# Patient Record
Sex: Female | Born: 1960 | Race: White | Hispanic: No | Marital: Married | Smoking: Never smoker
Health system: Southern US, Community
[De-identification: ages and names within clinical notes are randomized; demographics above are authoritative.]

## PROBLEM LIST (undated history)

## (undated) DIAGNOSIS — I219 Acute myocardial infarction, unspecified: Secondary | ICD-10-CM

## (undated) DIAGNOSIS — E039 Hypothyroidism, unspecified: Secondary | ICD-10-CM

## (undated) DIAGNOSIS — Z13220 Encounter for screening for lipoid disorders: Secondary | ICD-10-CM

## (undated) DIAGNOSIS — T7840XA Allergy, unspecified, initial encounter: Secondary | ICD-10-CM

## (undated) DIAGNOSIS — K219 Gastro-esophageal reflux disease without esophagitis: Secondary | ICD-10-CM

## (undated) DIAGNOSIS — J301 Allergic rhinitis due to pollen: Secondary | ICD-10-CM

## (undated) DIAGNOSIS — C4491 Basal cell carcinoma of skin, unspecified: Secondary | ICD-10-CM

## (undated) DIAGNOSIS — L719 Rosacea, unspecified: Secondary | ICD-10-CM

## (undated) DIAGNOSIS — I10 Essential (primary) hypertension: Secondary | ICD-10-CM

## (undated) DIAGNOSIS — F419 Anxiety disorder, unspecified: Secondary | ICD-10-CM

## (undated) DIAGNOSIS — E7800 Pure hypercholesterolemia, unspecified: Secondary | ICD-10-CM

## (undated) DIAGNOSIS — Z833 Family history of diabetes mellitus: Secondary | ICD-10-CM

## (undated) HISTORY — DX: Pure hypercholesterolemia, unspecified: E78.00

## (undated) HISTORY — DX: Family history of diabetes mellitus: Z83.3

## (undated) HISTORY — DX: Rosacea, unspecified: L71.9

## (undated) HISTORY — DX: Hypothyroidism, unspecified: E03.9

## (undated) HISTORY — DX: Allergic rhinitis due to pollen: J30.1

## (undated) HISTORY — DX: Acute myocardial infarction, unspecified: I21.9

## (undated) HISTORY — DX: Gastro-esophageal reflux disease without esophagitis: K21.9

## (undated) HISTORY — PX: AUGMENTATION MAMMAPLASTY: SUR837

## (undated) HISTORY — DX: Basal cell carcinoma of skin, unspecified: C44.91

## (undated) HISTORY — DX: Anxiety disorder, unspecified: F41.9

## (undated) HISTORY — DX: Essential (primary) hypertension: I10

## (undated) HISTORY — DX: Encounter for screening for lipoid disorders: Z13.220

## (undated) HISTORY — DX: Allergy, unspecified, initial encounter: T78.40XA

---

## 1995-12-15 HISTORY — PX: BREAST ENHANCEMENT SURGERY: SHX7

## 1999-12-11 ENCOUNTER — Encounter: Admission: RE | Admit: 1999-12-11 | Discharge: 2000-01-06 | Payer: Self-pay | Admitting: Family Medicine

## 2001-09-29 ENCOUNTER — Ambulatory Visit (HOSPITAL_BASED_OUTPATIENT_CLINIC_OR_DEPARTMENT_OTHER): Admission: RE | Admit: 2001-09-29 | Discharge: 2001-09-29 | Payer: Self-pay | Admitting: Plastic Surgery

## 2001-09-29 ENCOUNTER — Encounter (INDEPENDENT_AMBULATORY_CARE_PROVIDER_SITE_OTHER): Payer: Self-pay | Admitting: Specialist

## 2001-11-24 ENCOUNTER — Other Ambulatory Visit: Admission: RE | Admit: 2001-11-24 | Discharge: 2001-11-24 | Payer: Self-pay | Admitting: Obstetrics and Gynecology

## 2002-11-28 ENCOUNTER — Other Ambulatory Visit: Admission: RE | Admit: 2002-11-28 | Discharge: 2002-11-28 | Payer: Self-pay | Admitting: Obstetrics and Gynecology

## 2004-01-24 ENCOUNTER — Other Ambulatory Visit: Admission: RE | Admit: 2004-01-24 | Discharge: 2004-01-24 | Payer: Self-pay | Admitting: Obstetrics and Gynecology

## 2005-04-20 ENCOUNTER — Other Ambulatory Visit: Admission: RE | Admit: 2005-04-20 | Discharge: 2005-04-20 | Payer: Self-pay | Admitting: Obstetrics and Gynecology

## 2007-08-09 ENCOUNTER — Ambulatory Visit: Payer: Self-pay | Admitting: Internal Medicine

## 2007-08-09 LAB — CONVERTED CEMR LAB
BUN: 7 mg/dL
Basophils Absolute: 0 K/uL
Basophils Relative: 0.4 %
CO2: 30 meq/L
Calcium: 9.5 mg/dL
Chloride: 104 meq/L
Creatinine, Ser: 0.8 mg/dL
Eosinophils Absolute: 0.1 K/uL
Eosinophils Relative: 1.1 %
GFR calc Af Amer: 100 mL/min
GFR calc non Af Amer: 82 mL/min
Glucose, Bld: 119 mg/dL — ABNORMAL HIGH
HCT: 37.4 %
Hemoglobin: 12.9 g/dL
Lymphocytes Relative: 30.3 %
MCHC: 34.4 g/dL
MCV: 87.4 fL
Monocytes Absolute: 0.5 K/uL
Monocytes Relative: 8.7 %
Neutro Abs: 3.7 K/uL
Neutrophils Relative %: 59.5 %
Platelets: 283 K/uL
Potassium: 4.3 meq/L
Pro B Natriuretic peptide (BNP): 18 pg/mL
RBC: 4.28 M/uL
RDW: 12.9 %
Sed Rate: 13 mm/h
Sodium: 141 meq/L
TSH: 2.01 u[IU]/mL
WBC: 6.2 10*3/microliter

## 2007-08-11 ENCOUNTER — Ambulatory Visit: Payer: Self-pay | Admitting: Cardiology

## 2007-08-11 ENCOUNTER — Ambulatory Visit: Payer: Self-pay | Admitting: Internal Medicine

## 2007-08-17 ENCOUNTER — Ambulatory Visit: Payer: Self-pay

## 2007-08-17 ENCOUNTER — Encounter: Payer: Self-pay | Admitting: Internal Medicine

## 2007-09-14 ENCOUNTER — Ambulatory Visit: Payer: Self-pay | Admitting: Internal Medicine

## 2007-09-15 ENCOUNTER — Ambulatory Visit: Payer: Self-pay | Admitting: Internal Medicine

## 2007-09-29 ENCOUNTER — Ambulatory Visit: Payer: Self-pay | Admitting: Internal Medicine

## 2008-01-14 ENCOUNTER — Encounter: Payer: Self-pay | Admitting: Internal Medicine

## 2008-01-14 DIAGNOSIS — J301 Allergic rhinitis due to pollen: Secondary | ICD-10-CM | POA: Insufficient documentation

## 2008-01-14 DIAGNOSIS — E039 Hypothyroidism, unspecified: Secondary | ICD-10-CM | POA: Insufficient documentation

## 2008-11-22 ENCOUNTER — Ambulatory Visit: Payer: Self-pay | Admitting: Family Medicine

## 2008-11-22 DIAGNOSIS — Z85828 Personal history of other malignant neoplasm of skin: Secondary | ICD-10-CM | POA: Insufficient documentation

## 2008-11-22 DIAGNOSIS — K219 Gastro-esophageal reflux disease without esophagitis: Secondary | ICD-10-CM | POA: Insufficient documentation

## 2008-11-22 HISTORY — DX: Personal history of other malignant neoplasm of skin: Z85.828

## 2008-12-03 ENCOUNTER — Ambulatory Visit: Payer: Self-pay | Admitting: Family Medicine

## 2008-12-10 ENCOUNTER — Telehealth: Payer: Self-pay | Admitting: Family Medicine

## 2008-12-12 LAB — CONVERTED CEMR LAB
Alkaline Phosphatase: 54 units/L (ref 39–117)
Bilirubin, Direct: 0.1 mg/dL (ref 0.0–0.3)
Calcium: 8.9 mg/dL (ref 8.4–10.5)
GFR calc Af Amer: 99 mL/min
GFR calc non Af Amer: 82 mL/min
HDL: 64.9 mg/dL (ref >39.0)
Potassium: 4.4 meq/L (ref 3.5–5.1)
Sodium: 141 meq/L (ref 135–145)
TSH: 1.17 microintl units/mL (ref 0.35–5.50)
Total Bilirubin: 0.7 mg/dL (ref 0.3–1.2)
Total CHOL/HDL Ratio: 3.4
Total Protein: 6.9 g/dL (ref 6.0–8.3)
Triglycerides: 55 mg/dL (ref 0–149)
VLDL: 11 mg/dL (ref 0–40)

## 2009-01-15 ENCOUNTER — Ambulatory Visit: Payer: Self-pay | Admitting: Family Medicine

## 2009-03-13 ENCOUNTER — Ambulatory Visit: Payer: Self-pay | Admitting: Family Medicine

## 2009-03-13 DIAGNOSIS — E7800 Pure hypercholesterolemia, unspecified: Secondary | ICD-10-CM | POA: Insufficient documentation

## 2009-03-18 LAB — CONVERTED CEMR LAB
Cholesterol: 191 mg/dL (ref 0–200)
LDL Cholesterol: 128 mg/dL — ABNORMAL HIGH (ref 0–99)
Triglycerides: 53 mg/dL (ref 0.0–149.0)
VLDL: 10.6 mg/dL (ref 0.0–40.0)

## 2009-04-17 ENCOUNTER — Ambulatory Visit: Payer: Self-pay | Admitting: Family Medicine

## 2009-11-13 ENCOUNTER — Telehealth: Payer: Self-pay | Admitting: Family Medicine

## 2010-01-14 LAB — CONVERTED CEMR LAB: Pap Smear: NORMAL

## 2010-07-01 ENCOUNTER — Ambulatory Visit: Payer: Self-pay | Admitting: Family Medicine

## 2010-07-01 DIAGNOSIS — L719 Rosacea, unspecified: Secondary | ICD-10-CM | POA: Insufficient documentation

## 2010-07-07 LAB — CONVERTED CEMR LAB
Albumin: 4 g/dL (ref 3.5–5.2)
Bilirubin, Direct: 0.1 mg/dL (ref 0.0–0.3)
CO2: 29 meq/L (ref 19–32)
Calcium: 8.9 mg/dL (ref 8.4–10.5)
Cholesterol: 228 mg/dL — ABNORMAL HIGH (ref 0–200)
Creatinine, Ser: 0.7 mg/dL (ref 0.4–1.2)
GFR calc non Af Amer: 91.63 mL/min (ref >60)
Glucose, Bld: 87 mg/dL (ref 70–99)
HDL: 57.1 mg/dL (ref >39.00)
Sodium: 141 meq/L (ref 135–145)
Total CHOL/HDL Ratio: 4
Total Protein: 7 g/dL (ref 6.0–8.3)
Triglycerides: 73 mg/dL (ref 0.0–149.0)

## 2010-10-27 ENCOUNTER — Ambulatory Visit: Payer: Self-pay | Admitting: Family Medicine

## 2010-10-28 LAB — CONVERTED CEMR LAB
Cholesterol: 211 mg/dL — ABNORMAL HIGH (ref 0–200)
HDL: 60.2 mg/dL (ref >39.00)
Triglycerides: 66 mg/dL (ref 0.0–149.0)

## 2010-11-05 ENCOUNTER — Ambulatory Visit: Payer: Self-pay | Admitting: Family Medicine

## 2010-11-25 ENCOUNTER — Telehealth: Payer: Self-pay | Admitting: Family Medicine

## 2011-01-13 NOTE — Assessment & Plan Note (Signed)
Summary: CHECK THYROID/REFILL MEDICATION/CE   Vital Signs:  Patient profile:   50 year old female Weight:      123.50 pounds BMI:     21.28 Temp:     98.6 degrees F oral Pulse rate:   74 / minute Pulse rhythm:   regular BP sitting:   104 / 70  (left arm) Cuff size:   regular  Vitals Entered By: Janee Morn CMA (July 01, 2010 8:20 AM) CC: Recheck thyroid   History of Present Illness: Doing well overeall.  No specific complaints.   Right ankle injury...seeing Dr. Charlsie Merles.  Occured after fal 5-6 weks ago. X-ray neg on repeat. Wearing brace Was running, but unable to run currently.    Problems Prior to Update: 1)  Acne Rosacea  (ICD-695.3) 2)  Pure Hypercholesterolemia  (ICD-272.0) 3)  Screening For Lipoid Disorders  (ICD-V77.91) 4)  Basal Cell Carcinoma, Hx of  (ICD-V10.83) 5)  Gerd  (ICD-530.81) 6)  Hypothyroidism  (ICD-244.9) 7)  Allergic Rhinitis, Seasonal  (ICD-477.0)  Current Medications (verified): 1)  Synthroid 50 Mcg Tabs (Levothyroxine Sodium) .... Take 2  Tablets  By Mouth Once A Day On Monday Thru Friday and One Tablet Once Daily On Saturday and Sunday 2)  Alprazolam 0.25 Mg Tabs (Alprazolam) .Marland Kitchen.. 1-2 Tab By Mouth Daily As Needed Anxiety  Allergies: 1)  ! Sulfa 2)  ! Pcn 3)  ! Zithromax 4)  ! Macrodantin  Past History:  Past medical, surgical, family and social histories (including risk factors) reviewed, and no changes noted (except as noted below).  Past Medical History: Reviewed history from 11/22/2008 and no changes required. HYPOTHYROIDISM (ICD-244.9) ALLERGIC RHINITIS, SEASONAL (ICD-477.0) GERD  Past Surgical History: Reviewed history from 11/22/2008 and no changes required. breast implants 1997  Family History: Reviewed history from 11/22/2008 and no changes required. fahter: HTN mother: healthy brother: ? heart issue PGF: MI late age  Social History: Reviewed history from 11/22/2008 and no changes required. 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 CV:  Denies chest pain or discomfort. Resp:  Denies shortness of breath. GI:  Denies abdominal pain, bloody stools, constipation, and diarrhea. GU:  Denies dysuria.  Physical Exam  General:  Well-developed,well-nourished,in no acute distress; alert,appropriate and cooperative throughout examination Mouth:  Oral mucosa and oropharynx without lesions or exudates.  Teeth in good repair. Neck:  no carotid bruit or thyromegaly no cervical or supraclavicular lymphadenopathy  Lungs:  Normal respiratory effort, chest expands symmetrically. Lungs are clear to auscultation, no crackles or wheezes. Heart:  Normal rate and regular rhythm. S1 and S2 normal without gallop, murmur, click, rub or other extra sounds. Abdomen:  Bowel sounds positive,abdomen soft and non-tender without masses, organomegaly or hernias noted. Pulses:  R and L posterior tibial pulses are full and equal bilaterally  Extremities:  no edema   Impression & Recommendations:  Problem # 1:  PURE HYPERCHOLESTEROLEMIA (ICD-272.0) Due for reeval. Encouraged exercise, weight loss, healthy eating habits.  Orders: TLB-Lipid Panel (80061-LIPID) TLB-BMP (Basic Metabolic Panel-BMET) (80048-METABOL) TLB-Hepatic/Liver Function Pnl (80076-HEPATIC)  Problem # 2:  HYPOTHYROIDISM (ICD-244.9) Due for reeval.  Her updated medication list for this problem includes:    Synthroid 50 Mcg Tabs (Levothyroxine sodium) .Marland Kitchen... Take 2  tablets  by mouth once a day on monday thru friday and one tablet once daily  on saturday and sunday  Orders: TLB-TSH (Thyroid Stimulating Hormone) (84443-TSH)  Complete Medication List: 1)  Synthroid 50 Mcg Tabs (Levothyroxine sodium) .... Take 2  tablets  by mouth once a day on  monday thru friday and one tablet once daily on saturday and sunday 2)  Alprazolam 0.25 Mg Tabs (Alprazolam) .Marland Kitchen.. 1-2 tab by mouth daily as needed anxiety  Other Orders: Tdap => 23yrs IM [phone]) Admin 1st Vaccine (69629) Admin 1st Vaccine [facility]) [phone])  Current Allergies (reviewed today): ! SULFA ! PCN ! ZITHROMAX ! MACRODANTIN  Last PAP:  Normal (05/16/2008 2:31:28 PM) PAP Result Date:  01/14/2010 PAP Result:  normal PAP Next Due:  [facility] in next few months.      Tetanus/Td Vaccine    Vaccine Type: Tdap    Site: left deltoid    Mfr: GlaxoSmithKline    Dose: 0.5 ml    Route: IM    Given by: Benny Lennert CMA (AAMA)    Exp. Date: 03/07/2012    Lot #: [id number]    VIS given: 11/01/07 version given July 01, 2010.

## 2011-01-13 NOTE — Assessment & Plan Note (Signed)
Summary: sinus/dlo   Vital Signs:  Patient profile:   50 year old female Weight:      126.50 pounds Temp:     97.3 degrees F oral Pulse rate:   72 / minute Pulse rhythm:   regular BP sitting:   126 / 80  (left arm) Cuff size:   regular  Vitals Entered By: Sydell Axon LPN (November 05, 2010 2:17 PM) CC: Headache since Monday night, ? problem with sinuses   History of Present Illness: Pt here forn headache since Mon Nite, taking Aleve around the clkock...Marland Kitchenone every eight hrs. it has bothered her stomach. She has does nothing for congestion. She has no real problems with allergies lately.  She has not had fever or chills, headache in frontal area (yesterday much worse), no ear pain, bilat lat neck discomfort, no rhinitis, mild nasal congestion, no ST, no cough. She has taken 3 Aleve a day.   Problems Prior to Update: 1)  Acne Rosacea  (ICD-695.3) 2)  Pure Hypercholesterolemia  (ICD-272.0) 3)  Screening For Lipoid Disorders  (ICD-V77.91) 4)  Basal Cell Carcinoma, Hx of  (ICD-V10.83) 5)  Gerd  (ICD-530.81) 6)  Hypothyroidism  (ICD-244.9) 7)  Allergic Rhinitis, Seasonal  (ICD-477.0)  Medications Prior to Update: 1)  Synthroid 50 Mcg Tabs (Levothyroxine Sodium) .... Take 2  Tablets  By Mouth Once A Day On Monday Thru Friday and One Tablet Once Daily On Saturday and Sunday 2)  Alprazolam 0.25 Mg Tabs (Alprazolam) .Marland Kitchen.. 1-2 Tab By Mouth Daily As Needed Anxiety  Current Medications (verified): 1)  Synthroid 50 Mcg Tabs (Levothyroxine Sodium) .... Take 2  Tablets  By Mouth Once A Day On Monday Thru Friday and One Tablet Once Daily On Saturday and Sunday 2)  Alprazolam 0.25 Mg Tabs (Alprazolam) .Marland Kitchen.. 1-2 Tab By Mouth Daily As Needed Anxiety 3)  Aleve 220 Mg Tabs (Naproxen Sodium) .... As Needed 4)  Doxycycline Hyclate 100 Mg Caps (Doxycycline Hyclate) .... One Tab By Mouth Two Times A Day For 14 Days.  Allergies: 1)  ! Sulfa 2)  ! Pcn 3)  ! Zithromax 4)  ! Macrodantin  Physical  Exam  General:  Well-developed,well-nourished,in no acute distress; alert,appropriate and cooperative throughout examination Head:  Normocephalic and atraumatic without obvious abnormalities. No apparent alopecia or balding. Sinuses NT. Eyes:  Conjunctiva clear bilaterally.  Ears:  External ear exam shows no significant lesions or deformities.  Otoscopic examination reveals clear canals, tympanic membranes are intact bilaterally without bulging, retraction, inflammation or discharge. Hearing is grossly normal bilaterally. Nose:  External nasal examination shows no deformity or inflammation. Nasal mucosa are pink and moist without lesions or exudates. Mouth:  Oral mucosa and oropharynx without lesions or exudates.  Teeth in good repair. Neck:  No deformities, masses, or tenderness noted. Lungs:  Normal respiratory effort, chest expands symmetrically. Lungs are clear to auscultation, no crackles or wheezes. Heart:  Normal rate and regular rhythm. S1 and S2 normal without gallop, murmur, click, rub or other extra sounds.   Impression & Recommendations:  Problem # 1:  URI (ICD-465.9) Assessment New  With sinus congestion....see instructions. Her updated medication list for this problem includes:    Aleve 220 Mg Tabs (Naproxen sodium) .Marland Kitchen... As needed  Instructed on symptomatic treatment. Call if symptoms persist or worsen.   Complete Medication List: 1)  Synthroid 50 Mcg Tabs (Levothyroxine sodium) .... Take 2  tablets  by mouth once a day on monday thru friday and one tablet once daily on saturday  and sunday 2)  Alprazolam 0.25 Mg Tabs (Alprazolam) .Marland Kitchen.. 1-2 tab by mouth daily as needed anxiety 3)  Aleve 220 Mg Tabs (Naproxen sodium) .... As needed 4)  Doxycycline Hyclate 100 Mg Caps (Doxycycline hyclate) .... One tab by mouth two times a day for 14 days.  Patient Instructions: 1)  Take Guaifenesin by [facility] or [facility] and getting MUCOUS RELIEF EXPECTORANT  (400mg ), take 11/2 tabs by mouth AM and NOON. 2)  Drink lots of fluids anytime taking Guaifenesin.  3)  Take Aleve 2 tabs after brfst and supper 4)  If no impr in 3-4 days or fevers develop, use Doxy Prescriptions: DOXYCYCLINE HYCLATE 100 MG CAPS (DOXYCYCLINE HYCLATE) one tab by mouth two times a day for 14 days.  #28 x 0   Entered and Authorized by:   Shaune Leeks MD   Signed by:   Shaune Leeks MD on 11/05/2010   Method used:   Print then Give to Patient   RxID:   [id number]    Orders Added: 1)  Est. Patient Level III [56213]    Current Allergies (reviewed today): ! SULFA ! PCN ! ZITHROMAX ! MACRODANTIN

## 2011-01-15 NOTE — Progress Notes (Signed)
Summary: ALPRAZOLAM  Phone Note Refill Request Message from:  [employer]line on November 25, 2010 8:58 AM  Refills Requested: Medication #1:  ALPRAZOLAM 0.25 MG TABS 1-2 tab by mouth daily as needed anxiety   Supply Requested: 1 month [facility] [address]   Method Requested: Telephone to Pharmacy Initial call taken by: Benny Lennert CMA Duncan Dull),  November 25, 2010 8:58 AM  Follow-up for Phone Call        Rx called to pharmacy Follow-up by: Benny Lennert CMA Duncan Dull),  November 25, 2010 11:00 AM    Prescriptions: ALPRAZOLAM 0.25 MG TABS (ALPRAZOLAM) 1-2 tab by mouth daily as needed anxiety  #30 x 0   Entered and Authorized by:   Kerby Nora MD   Signed by:   Kerby Nora MD on 11/25/2010   Method used:   Telephoned to ...       [facility] [address] [phone]* (retail)       [address] [place], [place]  [id number]       Ph: [phone]       Fax: [phone]   RxID:   [id number]

## 2011-01-29 ENCOUNTER — Encounter (INDEPENDENT_AMBULATORY_CARE_PROVIDER_SITE_OTHER): Payer: Self-pay | Admitting: *Deleted

## 2011-01-29 ENCOUNTER — Other Ambulatory Visit: Payer: Self-pay | Admitting: Family Medicine

## 2011-01-29 ENCOUNTER — Other Ambulatory Visit (INDEPENDENT_AMBULATORY_CARE_PROVIDER_SITE_OTHER): Payer: Federal, State, Local not specified - PPO

## 2011-01-29 ENCOUNTER — Telehealth (INDEPENDENT_AMBULATORY_CARE_PROVIDER_SITE_OTHER): Payer: Self-pay | Admitting: *Deleted

## 2011-01-29 DIAGNOSIS — Z833 Family history of diabetes mellitus: Secondary | ICD-10-CM

## 2011-01-29 DIAGNOSIS — E039 Hypothyroidism, unspecified: Secondary | ICD-10-CM

## 2011-01-29 DIAGNOSIS — E7800 Pure hypercholesterolemia, unspecified: Secondary | ICD-10-CM

## 2011-01-29 DIAGNOSIS — E785 Hyperlipidemia, unspecified: Secondary | ICD-10-CM

## 2011-01-29 LAB — LDL CHOLESTEROL, DIRECT: Direct LDL: 129.8 mg/dL

## 2011-01-29 LAB — GLUCOSE, RANDOM: Glucose, Bld: 85 mg/dL (ref 70–99)

## 2011-01-29 LAB — TSH: TSH: 0.13 u[IU]/mL — ABNORMAL LOW (ref 0.35–5.50)

## 2011-02-04 ENCOUNTER — Encounter: Payer: Self-pay | Admitting: Internal Medicine

## 2011-02-04 ENCOUNTER — Ambulatory Visit (INDEPENDENT_AMBULATORY_CARE_PROVIDER_SITE_OTHER): Payer: Federal, State, Local not specified - PPO | Admitting: Internal Medicine

## 2011-02-04 ENCOUNTER — Encounter (INDEPENDENT_AMBULATORY_CARE_PROVIDER_SITE_OTHER): Payer: Self-pay | Admitting: *Deleted

## 2011-02-04 DIAGNOSIS — K219 Gastro-esophageal reflux disease without esophagitis: Secondary | ICD-10-CM

## 2011-02-04 NOTE — Progress Notes (Signed)
----   Converted from flag ---- ---- 01/29/2011 8:23 AM, Kerby Nora MD wrote: Molli Knock.  ---- 01/29/2011 7:46 AM, Liane Comber CMA (AAMA) wrote: pt request glucose be added due to family hx of dm.Is it ok to add? Thanks Tasha ------------------------------

## 2011-02-10 NOTE — Letter (Signed)
Summary: Generic Letter  [place] [facility]  [address] [place], [place] [address]   Phone: [phone]  Fax: [phone]    02/04/2011      [facility] [address] [place], [place]  [address]  [place]     Dear Ms. Hession,   Let her know that cholesterol continue to improve. Continue good work.  Her thyroid appears slightly over treated on current dose of medicaiton, but previously was on upper limits...will continue on current dose and follow.   Have her make follow up appt in 7.2011. with fasting lipiD, CMET and TSH prior Dx 272.0, 244.9         Sincerely,   Kerby Nora MD

## 2011-02-10 NOTE — Assessment & Plan Note (Signed)
Summary: GERD.Nicole Henry  SCH'D W/PT//[employer]//NO GI HX//MEDLIST//CX POLICY ADVISED   History of Present Illness Visit Type: Initial Consult Primary GI MD: Lina Sar MD Primary Provider: Kerby Nora, MD Requesting Provider: n/a Chief Complaint: pt. c/o pressure in chest with belching History of Present Illness:   This is a 50 year old white female with a recent episode of substernal chest pain which is consistent with gastroesophageal reflux. She denies hoarseness or nocturnal coughing. The episodes can be eliminated by reducing her caffeine intake and modifying her diet. The reflux started after she started to jog. She has been followed by Dr.Wert for possible pulmonary problems but his impression was that the cause of the chest discomfort was  reflux related. Her weight has been stable. She does not smoke or drink alcohol. When she started taking Aleve for sinusitis, her symptoms became temporally worse. She was given Prilosec 20 mg a day for a short period of time and it eliminated all symptoms of reflux. She was advised not to take it on a chronic basis. Her only other medical problem consist of hypothyroidism.   GI Review of Systems    Reports belching and  chest pain.      Denies abdominal pain, acid reflux, bloating, dysphagia with liquids, dysphagia with solids, heartburn, loss of appetite, nausea, vomiting, vomiting blood, weight loss, and  weight gain.        Denies anal fissure, black tarry stools, change in bowel habit, constipation, diarrhea, diverticulosis, fecal incontinence, heme positive stool, hemorrhoids, irritable bowel syndrome, jaundice, light color stool, liver problems, rectal bleeding, and  rectal pain.    Current Medications (verified): 1)  Synthroid 50 Mcg Tabs (Levothyroxine Sodium) .... Take 2  Tablets  By Mouth Once A Day On Monday Thru Friday and One Tablet Once Daily On Saturday and "Sunday 2)  Prilosec Otc 20 Mg Tbec (Omeprazole Magnesium) .... 1 By Mouth As  Needed  Allergies (verified): 1)  ! Sulfa 2)  ! Pcn 3)  ! Zithromax 4)  ! Macrodantin 5)  ! Erythromycin  Past History:  Past Medical History: Reviewed history from 11/22/2008 and no changes required. HYPOTHYROIDISM (ICD-244.9) ALLERGIC RHINITIS, SEASONAL (ICD-477.0) GERD  Past Surgical History: Reviewed history from 02/02/2011 and no changes required. breast Augmentation 1997  Family History: Reviewed history from 11/22/2008 and no changes required. fahter: HTN mother: healthy brother: ? heart issue PGF: MI late age Family History of Colitis/Crohn's:[facility]-mother  Social History: Reviewed history from 11/22/2008 and no changes required. 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: healthy Never Smoked Alcohol use-yes, rarely Drug use-no Regular exercise-yes, running 2-3 times a week Diet: fruits and veggies  Review of Systems  The patient denies allergy/sinus, anemia, anxiety-new, arthritis/joint pain, back pain, blood in urine, breast changes/lumps, change in vision, confusion, cough, coughing up blood, depression-new, fainting, fatigue, fever, headaches-new, hearing problems, heart murmur, heart rhythm changes, itching, menstrual pain, muscle pains/cramps, night sweats, nosebleeds, pregnancy symptoms, shortness of breath, skin rash, sleeping problems, sore throat, swelling of feet/legs, swollen lymph glands, thirst - excessive , urination - excessive , urination changes/pain, urine leakage, vision changes, and voice change.         Pertinent positive and negative review of systems were noted in the above HPI. All other ROS was otherwise negative.   Vital Signs:  Patient profile:   49 year old female Height:      64 inches Weight:      127.50 pounds BMI:     21" .96 Pulse  rate:   68 / minute Pulse rhythm:   regular BP sitting:   142 / 80  (left arm)  Vitals Entered By: Milford Cage NCMA (February 04, 2011 9:13 AM)  Physical  Exam  General:  alert, oriented and in no distress. Normal voice, no cough. Mouth:  No deformity or lesions, dentition normal. Neck:  no evidence of goiter. Chest Wall:  no tenderness of costochondral  junctions. Lungs:  Clear throughout to auscultation. Heart:  Regular rate and rhythm; no murmurs, rubs,  or bruits. Abdomen:  soft flaccid abdomen with normal active bowel sounds. No distention or tympany. Liver edge at costal margin. No tenderness. Rectal:  soft Hemoccult negative stool. Extremities:  No clubbing, cyanosis, edema or deformities noted. Skin:  Intact without significant lesions or rashes. Psych:  Alert and cooperative. Normal mood and affect.   Impression & Recommendations:  Problem # 1:  GERD (ICD-530.81) Patient has symptoms of substernal chest pain consistent with intermittent gastroesophageal reflux without evidence of LPR. Her symptoms were relieved with Prilosec. She has been able to eliminate the episodes with dietary modifications. There has been no dysphagia. We will treat her empirically with Pepcid 40 mg daily for the next 4 weeks and decide if she needs to continue it at that time. We have discussed a possible upper endoscopy to rule out a hiatal hernia or Barrett's esophagus. She would prefer a trial of medications first. We also have discussed calcium malabsorption and B12 absorption associated with acid suppression.  Problem # 2:  HYPOTHYROIDISM (ICD-244.9) euthyroid.  Patient Instructions: 1)  Please pick up your prescriptions at the pharmacy. Electronic prescription(s) has already been sent for Pepcid 40 mg. You should take 1 tablet by mouth once daily in place of Prilosec (omeprazole). 2)  Avoid foods high in acid content ( tomatoes, citrus juices, spicy foods) . Avoid eating within 3 to 4 hours of lying down or before exercising. Do not over eat; try smaller more frequent meals. Elevate head of bed four inches when sleeping. We have also given you an  additional handout on this. 3)  Copy sent to : Dr Judie Petit.Wert 4)  The medication list was reviewed and reconciled.  All changed / newly prescribed medications were explained.  A complete medication list was provided to the patient / caregiver. Prescriptions: PEPCID 40 MG TABS (FAMOTIDINE) Take 1 tablet by mouth once a day  #30 x 3   Entered by:   Lamona Curl CMA (AAMA)   Authorized by:   Hart Carwin MD   Signed by:   Lamona Curl CMA (AAMA) on 02/04/2011   Method used:   Electronically to        [facility]. [address] [phone]* (retail)       [address] [place], [place]  [id number]       Ph: [phone]       Fax: [phone]   RxID:   [id number]

## 2011-04-28 NOTE — Assessment & Plan Note (Signed)
[facility]                             PULMONARY OFFICE NOTE   NAME:[name], Henry                        MRN:          [id number]  DATE:09/29/2007                            DOB:          1961-03-05    HISTORY:  A 50 year old white female, never smoker.  [name]  with complete elimination of all of her symptoms of dyspnea while on  Protonix 40 mg daily.  I noted that she had previously moderately  elevated blood pressure and contemplated that she might have diastolic  dysfunction.  I placed her on Micardis.  However, she became presyncopal  on Micardis a week ago and stopped it and is monitoring her own blood  pressure with no significant elevations.   PHYSICAL EXAMINATION:  She is a pleasant, ambulatory white female in no  acute distress.  She is afebrile with normal vital signs.  HEENT:  Unremarkable.  Oropharynx is clear.  Lung fields are perfectly clear bilaterally to auscultation and  percussion.  HEART:  Regular rhythm without murmurs, gallops, or rubs.  ABDOMEN:  Soft, benign.  EXTREMITIES:  Warm without calf tenderness, cyanosis, clubbing, or  edema.   PFTs were normal from September 14, 2007, and all the lab studies,  including BNP, obtained August 09, 2007 are all normal.   IMPRESSION:  1. No evidence that she has a significant pulmonary or cardiac      disease.  All of her symptoms have totally been eliminated on      Protonix 40 mg daily.  I, therefore, recommended she continue the      Protonix daily for a full 3 months and then stop them cold [place]      to see if any of her symptoms relapse.  If so, she needs to be      considered for referral to our gastroenterology division.  2. Situational hypertension.  Since she has a normal echo with no      evidence of left atrial enlargement or left ventricular systolic or      diastolic dysfunction, I do not believe we need to treat      hypertension aggressively, but I have  asked her to continue to      monitor it off therapy.   She is planning to establish with Dr. Kerby Nora at the [facility]  site.  Pulmonary followup can be p.r.n.     Nicole Henry. Nicole Sires, MD, [facility], [place]  Electronically Signed    MBW/MedQ  DD: 09/29/2007  DT: 09/30/2007  Job #: [id number]

## 2011-04-28 NOTE — Assessment & Plan Note (Signed)
[facility]                             PULMONARY OFFICE NOTE   NAME:Nicole Henry, Nicole Henry                        MRN:          [id number]  DATE:08/09/2007                            DOB:          [birth year]    HISTORY:  A 50 year old white female never smoker who was actively  jogging until 3 weeks ago with new onset dyspnea with exertion which has  progressed to the point where she is short of breath just having a  conversation.  She has the sensation of too much throat mucous but no  significant cough or excess sputum production.  She does have a history  of seasonal rhinitis but denies any recent flare-up.   She denies any nocturnal symptoms at present, that is no orthopnea, PND,  or leg swelling, fevers, chills, or sweats.  She does have occasional  chest discomfort but not directly related to the dyspnea.   She has also noticed occasional chest discomfort and denies any  pleuritic pain or significant cough, fevers, chills, sweats, or weight  loss.   PAST MEDICAL HISTORY:  Significant for seasonal rhinitis controlled with  Claritin and hypothyroidism for which she could not be controlled by any  doctor but the one at [facility].  She has arrived at a dose of  Synthroid of 50 mcg alternating with 75 mcg in an unusual schedule that  seems to work for her.   ALLERGIES:  Extensive and includes SULFA, ZITHROMAX, PENICILLIN,  MACRODANTIN.   MEDICATIONS:  1. Synthroid as directed 75 mcg alternating with 50 mcg.  2. Multivitamin one daily.   SOCIAL HISTORY:  She has never smoked.  She works as an Copywriter, advertising.   FAMILY HISTORY:  Positive for allergies in her son.  Positive for heart  disease in her younger brother who questionably needs a transplant.   REVIEW OF SYSTEMS:  Taken in detail on the worksheet, negative except as  above.   PHYSICAL EXAMINATION:  GENERAL:  This is a slightly anxious, but  pleasant ambulatory white female.  VITAL SIGNS:  Blood pressure 152/84, pulse rate 100.  HEENT:  Unremarkable.  Oropharynx is clear.  NECK:  Supple without cervical adenopathy or tenderness.  Trachea is  negative.  LUNGS:  Lung fields completely clear bilateral to auscultation and  percussion.  HEART:  There is a regular rhythm with question of summation gallop.  ABDOMEN:  Otherwise soft and benign.  EXTREMITIES:  Warm without calf tenderness, cyanosis, or clubbing.   Chest x-ray was normal.   LABORATORY DATA:  Hemoglobin and saturation was 100% on room air.   EKG was done today and revealed a pulse rate of only 83 with suggestion  of left atrial abnormality, but otherwise was normal.   IMPRESSION:  Unexplained dyspnea associated with throat congestion but  no excess mucous production may be a component of globus hystericus or  reflux.  However, because the pattern has been one of progressive  decline over the last 3 weeks, I also considered the possibility of  occult heart failure, perhaps related to hypertension, thromboembolic  disease for which a lab profile was pending.   I did offer to put the patient in the hospital to sort through the  differential diagnoses, but for now do not recommend any interventions  other than to consider antihypertensive therapy, which she said did not  think was necessary because her blood pressure has never been elevated  before and in fact when she checked it earlier it was only around 100  systolic and this elevation might be due to white coat hypertension.     Charlaine Dalton. Sherene Sires, MD, [facility]  Electronically Signed    MBW/MedQ  DD: 08/09/2007  DT: 08/10/2007  Job #: [id number]

## 2011-04-28 NOTE — Assessment & Plan Note (Signed)
[facility]                             PULMONARY OFFICE NOTE   NAME:[name], Nicole Henry                        MRN:          [id number]  DATE:09/15/2007                            DOB:          16-Nov-1961    HISTORY:  A 50 year old white female with new onset dyspnea associated  with throat congestion and tightness in early August.  Most of her  symptoms have resolved now on treatment directed at reflux, Protonix 40  mg q.a.m. and a diet.  She has subsequently undergone evaluation with  echocardiogram looking for any evidence of pulmonary hypertension or  right-to-left shunting.  This was normal on September 3rd, except for  early bubbles suggesting a possible pulmonary shunt.  She returns,  therefore, for PFTs today, which are essentially normal.   The patient is improving considerably over baseline, and was started on  Micardis 40/12.5 one daily for hypertension.  She found on her own that  1/2 a pill per day worked just fine.   PHYSICAL EXAMINATION:  She is a pleasant, ambulatory, white female in no  acute distress.  She had stable vital signs, except for a blood pressure of 140/80.  HEENT:  Unremarkable.  Oropharynx clear.  LUNG FIELDS:  Perfectly clear bilaterally to auscultation and  percussion.  HEART:  Appears a regular rhythm without murmur, gallop, or rub.  ABDOMEN:  Soft and benign.  EXTREMITIES:  Warm without calf tenderness, cyanosis, clubbing, or  edema.   PFTs were reviewed with the patient, and are normal.  CT scan of the  chest was also normal showing no evidence on echocardiogram of pulmonary  embolism on a study done August 28th.   IMPRESSION:  1. Hypertension is not ideally controlled, and I have recommended she      increase the Micardis up to 40/12.5 one daily.  2. Her throat congestion and most of her dyspnea have improved on      Protonix suggesting a reflux mechanism.  I have reviewed with her      the recommendation that  she continue for a full 3 months.  After 3      months, I would like her to stop it to see if the symptoms recur.      If so, she needs to be referred to GI for other secondary      complications of reflux such as stricture or Barrett's.   Her chronic dyspnea on exertion has not completely resolved, and I have  recommended a rehabilitation program for her where she is short of  breath, but not out of breath 30 minutes daily for at least 2 weeks  before returning CPST if not satisfied that she is improving with  regular exercise.     Charlaine Dalton. Sherene Sires, MD, [facility]  Electronically Signed   MBW/MedQ  DD: 09/15/2007  DT: 09/16/2007  Job #: [id number]

## 2011-04-28 NOTE — Assessment & Plan Note (Signed)
[facility]                             PULMONARY OFFICE NOTE   NAME:Nicole Henry, Kupfer                        MRN:          [id number]  DATE:08/11/2007                            DOB:          [birth year]    HISTORY:  A 50 year old was female with new onset dyspnea with exertion  associated with a sensation of throat congestion and tightness three  weeks ago.  I was most concerned on our initial visit on August 26 about  a when I first met her about occult heart failure because she had  resting tachycardia with evidence of left atrial enlargement.  However,  BNP was normal.  Chest x-ray did not show cardiomegaly or infiltrates  and a CT scan had subsequently been performed which shows no evidence of  thromboembolic disease and interstitial lung disease or congestive heart  failure.  Marland Kitchen   PHYSICAL EXAMINATION:  GENERAL:  She is a pleasant, ambulatory, white  female in no acute distress.  VITAL SIGNS:  Blood pressure again is elevated at 150/80, pulse rate  around 90 at rest.  HEENT:  Unremarkable.  Oropharynx is clear.  NECK:  Supple without cervical adenopathy or tenderness.  Trachea  midline. No thyromegaly.  Carotid upstrokes brisk, no bruits.  LUNGS:  Clear fields are completely clear bilaterally to auscultation  and percussion with no cough  elicited on inspiratory and expiratory  maneuvers.  HEART:  Regular rhythm without murmur, gallop or rub.  ABDOMEN:  Soft benign.  EXTREMITIES:  Warm without calf tenderness, cyanosis or clubbing or  edema.   LABORATORY DATA:  Lab studies were reviewed from her last visit  including a normal D-dimer, normal sed rate and hemoglobin.  Bicarb  level was normal at 30.   We walked around the office three laps and she desaturated to 82% with a  peak heart rate of 110.  She tells me that her brother is presently  considering a heart transplant because of a genetic heart problem.  She does not know what it is.   IMPRESSION:  1. Unexplained new onset dyspnea on exertion associated with      desaturation that occurs reproducibly and left atrial enlargement      on EKG.  I wonder if she does not have an element of pulmonary      hypertension and/or congenital heart defect with right to left      shunting.  I have recommended, therefore, an echocardiogram with      bubble study be done as soon as possible and in the meantime, added      Micardis 40/12.5 one daily to her regimen.   Note also that she has symptoms that seem consistent with a globus  sensation that may be reflux-related.  I started her on Protonix 40 mg  tablets to be taken before breakfast for the next two weeks, pending  completion of her evaluation.   I did assure her that we have excluded pulmonary disorders including  blood clots from the differential and that we would arrange further work-  up pending the results of  the above bubble.     Charlaine Dalton. Sherene Sires, MD, [facility]  Electronically Signed    MBW/MedQ  DD: 08/11/2007  DT: 08/12/2007  Job #: [id number]

## 2011-07-17 ENCOUNTER — Other Ambulatory Visit: Payer: Self-pay | Admitting: Obstetrics and Gynecology

## 2011-07-17 DIAGNOSIS — Z1231 Encounter for screening mammogram for malignant neoplasm of breast: Secondary | ICD-10-CM

## 2011-07-21 ENCOUNTER — Ambulatory Visit
Admission: RE | Admit: 2011-07-21 | Discharge: 2011-07-21 | Disposition: A | Discharge status: Home / Self Care | Payer: Federal, State, Local not specified - PPO | Source: Ambulatory Visit | Attending: Obstetrics and Gynecology | Admitting: Obstetrics and Gynecology

## 2011-07-21 DIAGNOSIS — Z1231 Encounter for screening mammogram for malignant neoplasm of breast: Secondary | ICD-10-CM

## 2011-08-03 ENCOUNTER — Other Ambulatory Visit: Payer: Self-pay | Admitting: Family Medicine

## 2011-09-23 ENCOUNTER — Ambulatory Visit (INDEPENDENT_AMBULATORY_CARE_PROVIDER_SITE_OTHER): Payer: Federal, State, Local not specified - PPO

## 2011-09-23 DIAGNOSIS — Z23 Encounter for immunization: Secondary | ICD-10-CM

## 2012-03-19 ENCOUNTER — Other Ambulatory Visit: Payer: Self-pay | Admitting: Family Medicine

## 2012-04-04 ENCOUNTER — Telehealth: Payer: Self-pay | Admitting: Family Medicine

## 2012-04-04 NOTE — Telephone Encounter (Signed)
Patient would like to switch from Dr. Ermalene Searing to Dr. Dayton Martes because she feels Dr. Dayton Martes is more available.  Please advise as to your wishes on this.  Thank you.

## 2012-04-04 NOTE — Telephone Encounter (Signed)
Please check with PCP.  I don't think I have seen this pt.

## 2012-04-05 NOTE — Telephone Encounter (Signed)
No problem with transfer.

## 2012-04-13 ENCOUNTER — Other Ambulatory Visit: Payer: Self-pay | Admitting: Family Medicine

## 2012-04-13 DIAGNOSIS — Z01419 Encounter for gynecological examination (general) (routine) without abnormal findings: Secondary | ICD-10-CM | POA: Insufficient documentation

## 2012-04-13 DIAGNOSIS — E7800 Pure hypercholesterolemia, unspecified: Secondary | ICD-10-CM

## 2012-04-13 DIAGNOSIS — Z0000 Encounter for general adult medical examination without abnormal findings: Secondary | ICD-10-CM

## 2012-04-13 DIAGNOSIS — E039 Hypothyroidism, unspecified: Secondary | ICD-10-CM

## 2012-04-14 ENCOUNTER — Other Ambulatory Visit (INDEPENDENT_AMBULATORY_CARE_PROVIDER_SITE_OTHER): Payer: Federal, State, Local not specified - PPO

## 2012-04-14 ENCOUNTER — Encounter: Payer: Self-pay | Admitting: Family Medicine

## 2012-04-14 DIAGNOSIS — E7800 Pure hypercholesterolemia, unspecified: Secondary | ICD-10-CM

## 2012-04-14 DIAGNOSIS — E039 Hypothyroidism, unspecified: Secondary | ICD-10-CM

## 2012-04-14 DIAGNOSIS — Z0000 Encounter for general adult medical examination without abnormal findings: Secondary | ICD-10-CM

## 2012-04-14 LAB — LDL CHOLESTEROL, DIRECT: Direct LDL: 149.8 mg/dL

## 2012-04-14 LAB — COMPREHENSIVE METABOLIC PANEL
Albumin: 4.2 g/dL (ref 3.5–5.2)
BUN: 13 mg/dL (ref 6–23)
CO2: 29 mEq/L (ref 19–32)
Calcium: 9.2 mg/dL (ref 8.4–10.5)
Chloride: 103 mEq/L (ref 96–112)
Glucose, Bld: 82 mg/dL (ref 70–99)
Potassium: 4.1 mEq/L (ref 3.5–5.1)
Sodium: 140 mEq/L (ref 135–145)
Total Protein: 7.2 g/dL (ref 6.0–8.3)

## 2012-04-14 LAB — LIPID PANEL
Cholesterol: 226 mg/dL — ABNORMAL HIGH (ref 0–200)
HDL: 65.4 mg/dL (ref >39.00)

## 2012-04-14 LAB — T4, FREE: Free T4: 0.87 ng/dL (ref 0.60–1.60)

## 2012-04-18 ENCOUNTER — Ambulatory Visit (INDEPENDENT_AMBULATORY_CARE_PROVIDER_SITE_OTHER): Payer: Federal, State, Local not specified - PPO | Admitting: Family Medicine

## 2012-04-18 ENCOUNTER — Encounter: Payer: Self-pay | Admitting: Internal Medicine

## 2012-04-18 ENCOUNTER — Encounter: Payer: Self-pay | Admitting: Family Medicine

## 2012-04-18 VITALS — BP 144/82 | HR 60 | Temp 97.7°F | Wt 129.0 lb

## 2012-04-18 DIAGNOSIS — R002 Palpitations: Secondary | ICD-10-CM

## 2012-04-18 DIAGNOSIS — Z1211 Encounter for screening for malignant neoplasm of colon: Secondary | ICD-10-CM

## 2012-04-18 DIAGNOSIS — E7800 Pure hypercholesterolemia, unspecified: Secondary | ICD-10-CM

## 2012-04-18 DIAGNOSIS — Z0000 Encounter for general adult medical examination without abnormal findings: Secondary | ICD-10-CM

## 2012-04-18 DIAGNOSIS — F4320 Adjustment disorder, unspecified: Secondary | ICD-10-CM

## 2012-04-18 DIAGNOSIS — E039 Hypothyroidism, unspecified: Secondary | ICD-10-CM

## 2012-04-18 MED ORDER — SYNTHROID 50 MCG PO TABS: ORAL_TABLET | ORAL | Status: DC

## 2012-04-18 MED ORDER — BUSPIRONE HCL 15 MG PO TABS: 7.5000 mg | ORAL_TABLET | Freq: Two times a day (BID) | ORAL | Status: AC

## 2012-04-18 NOTE — Progress Notes (Signed)
Subjective:    Patient ID: Nicole Henry, female    DOB: [birth year], 51 y.o.    MRN: [id number]  HPI  51 yo new to me here for CPX.  Training for 1/2 marathon.  At times, feels her heart racing which she thinks is normal during exercise but wants it checked out.  No CP or SOB.  No dizziness.  Heart rate comes down after exercise. BP mildly elevated today but at work, [employer] fund[employer] checks it periodically.  Per pt, usually runs in 120s systolic.  HLD- LDL a little elevated.  She has been exercising and working on diet.  Hypothyroidism- thyroid panel within normal limits. On Synthroid 50 mcg daily. Denies any symptoms of hypo or hyperthyroidism.  Has GYN- Dr. Huel Cote.  UTD on most prevention- due for first screening colonoscopy.  Lab Results  Component Value Date   CHOL 226* 04/14/2012   HDL 65.40 04/14/2012   LDLCALC 128* 03/13/2009   LDLDIRECT 149.8 04/14/2012   TRIG 108.0 04/14/2012   CHOLHDL 3 04/14/2012    Anxiety- has felt a little more anxious at work.  Usually "feels fine" at home but she does have increased stressors at work and feels herself getting "worked up."  No increased tearfulness. Denies feeling depressed.  No SI or HI. Wonders if there is "something non habit forming" she can take to help with anxiety. Review of Systems    See HPI Patient reports no  vision/ hearing changes,anorexia, weight change, fever ,adenopathy, persistant / recurrent hoarseness, swallowing issues, chest pain, edema,persistant / recurrent cough, hemoptysis, dyspnea(rest, exertional, paroxysmal nocturnal), gastrointestinal  bleeding (melena, rectal bleeding), abdominal pain, excessive heart burn, GU symptoms(dysuria, hematuria, pyuria, voiding/incontinence  Issues) syncope, focal weakness, severe memory loss, concerning skin lesions, depression, anxiety, abnormal bruising/bleeding, major joint swelling, breast masses or abnormal vaginal bleeding.    Objective:   Physical Exam BP 144/82  Pulse 60   Temp(Src) 97.7 F (36.5 C) (Oral)  Wt 129 lb (58.514 kg)  General:  Well-developed,well-nourished,in no acute distress; alert,appropriate and cooperative throughout examination Head:  normocephalic and atraumatic.   Eyes:  vision grossly intact, pupils equal, pupils round, and pupils reactive to light.   Ears:  R ear normal and L ear normal.   Nose:  no external deformity.   Mouth:  good dentition.   Neck:  No deformities, masses, or tenderness noted. Lungs:  Normal respiratory effort, chest expands symmetrically. Lungs are clear to auscultation, no crackles or wheezes. Heart:  Normal rate and regular rhythm. S1 and S2 normal without gallop, murmur, click, rub or other extra sounds. Abdomen:  Bowel sounds positive,abdomen soft and non-tender without masses, organomegaly or hernias noted. Msk:  No deformity or scoliosis noted of thoracic or lumbar spine.   Extremities:  No clubbing, cyanosis, edema, or deformity noted with normal full range of motion of all joints.   Neurologic:  alert & oriented X3 and gait normal.   Skin:  Intact without suspicious lesions or rashes Cervical Nodes:  No lymphadenopathy noted Axillary Nodes:  No palpable lymphadenopathy Psych:  Cognition and judgment appear intact. Alert and cooperative with normal attention span and concentration. No apparent delusions, illusions, hallucinations      Assessment & Plan:   1. Routine general medical examination at a health care facility  Reviewed preventive care protocols, scheduled due services, and updated immunizations Discussed nutrition, exercise, diet, and healthy lifestyle.    2. HYPOTHYROIDISM  Stable, continue current dose of synthroid. Rx sent electronically.   3. Screening for  colon cancer  Ambulatory referral to Gastroenterology  4. Adjustment disorder Discussed tx options.  Will start buspar 7.5 mg twice daily  Pt to call me in 2-3 weeks with an update of symptoms   5. Palpitation  EKG  reassuring. Likely normal cardiac response to exercise.  Advised to let me know if symptoms return and we can refer for Holter monitor. The patient indicates understanding of these issues and agrees with the plan.   EKG 12-Lead

## 2012-04-18 NOTE — Patient Instructions (Addendum)
Great to see you. Please stop by to see Shirlee Limerick on your way out to set up your GI appt. I will call you later after I speak to a pharmacist about your vitamins.

## 2012-06-03 ENCOUNTER — Ambulatory Visit (AMBULATORY_SURGERY_CENTER): Payer: Federal, State, Local not specified - PPO | Admitting: *Deleted

## 2012-06-03 VITALS — Ht 64.0 in | Wt 126.0 lb

## 2012-06-03 DIAGNOSIS — Z1211 Encounter for screening for malignant neoplasm of colon: Secondary | ICD-10-CM

## 2012-06-03 MED ORDER — MOVIPREP 100 G PO SOLR: ORAL | Status: DC

## 2012-06-17 ENCOUNTER — Ambulatory Visit (AMBULATORY_SURGERY_CENTER): Payer: Federal, State, Local not specified - PPO | Admitting: Internal Medicine

## 2012-06-17 ENCOUNTER — Encounter: Payer: Self-pay | Admitting: Internal Medicine

## 2012-06-17 VITALS — BP 153/93 | HR 75 | Temp 96.6°F | Resp 23 | Ht 64.0 in | Wt 126.0 lb

## 2012-06-17 DIAGNOSIS — Z1211 Encounter for screening for malignant neoplasm of colon: Secondary | ICD-10-CM

## 2012-06-17 MED ORDER — SODIUM CHLORIDE 0.9 % IV SOLN: 500.0000 mL | INTRAVENOUS | Status: DC

## 2012-06-17 NOTE — Op Note (Signed)
[facility] [address] [place], [place]  [address]  COLONOSCOPY PROCEDURE REPORT  PATIENT:  Nicole Henry, Nicole Henry  MR#:  [id number] BIRTHDATE:  1961-02-02, 50 yrs. old  GENDER:  female ENDOSCOPIST:  Hedwig Morton. Juanda Chance, MD REF. BY:  Ruthe Mannan, M.D. PROCEDURE DATE:  06/17/2012 PROCEDURE:  Colonoscopy 95284 ASA CLASS:  Class I INDICATIONS:  colorectal cancer screening, average risk MEDICATIONS:   MAC sedation, administered by CRNA, propofol (Diprivan) 160 mg  DESCRIPTION OF PROCEDURE:   After the risks and benefits and of the procedure were explained, informed consent was obtained. Digital rectal exam was performed and revealed no rectal masses. The LB [id number]-[id number] endoscope was introduced through the anus and advanced to the cecum, which was identified by both the appendix and ileocecal valve.  The quality of the prep was excellent, using MoviPrep.  The instrument was then slowly withdrawn as the colon was fully examined. <<PROCEDUREIMAGES>>  FINDINGS:  No polyps or cancers were seen (see image1, image2, and image3).   Retroflexed views in the rectum revealed no abnormalities.    The scope was then withdrawn from the patient and the procedure completed.  COMPLICATIONS:  None ENDOSCOPIC IMPRESSION: 1) No polyps or cancers 2) Normal colonoscopy RECOMMENDATIONS: 1) High fiber diet.  REPEAT EXAM:  In 10 year(s) for.  ______________________________ Hedwig Morton. Juanda Chance, MD  CC:  n. eSIGNED:   Hedwig Morton. Derrall Hicks at 06/17/2012 09:05 AM  Irven [place], [id number]

## 2012-06-17 NOTE — Progress Notes (Signed)
Patient did not experience any of the following events: a burn prior to discharge; a fall within the facility; wrong site/side/patient/procedure/implant event; or a hospital transfer or hospital admission upon discharge from the facility. (G8907) Patient did not have preoperative order for IV antibiotic SSI prophylaxis. (G8918)  

## 2012-06-17 NOTE — Patient Instructions (Addendum)
YOU HAD AN ENDOSCOPIC PROCEDURE TODAY AT THE [facility]: Refer to the procedure report that was given to you for any specific questions about what was found during the examination.  If the procedure report does not answer your questions, please call your gastroenterologist to clarify.  If you requested that your care partner not be given the details of your procedure findings, then the procedure report has been included in a sealed envelope for you to review at your convenience later.  YOU SHOULD EXPECT: Some feelings of bloating in the abdomen. Passage of more gas than usual.  Walking can help get rid of the air that was put into your GI tract during the procedure and reduce the bloating. If you had a lower endoscopy (such as a colonoscopy or flexible sigmoidoscopy) you may notice spotting of blood in your stool or on the toilet paper. If you underwent a bowel prep for your procedure, then you may not have a normal bowel movement for a few days.  DIET: Your first meal following the procedure should be a light meal and then it is ok to progress to your normal diet.  A half-sandwich or bowl of soup is an example of a good first meal.  Heavy or fried foods are harder to digest and may make you feel nauseous or bloated.  Likewise meals heavy in dairy and vegetables can cause extra gas to form and this can also increase the bloating.  Drink plenty of fluids but you should avoid alcoholic beverages for 24 hours.  ACTIVITY: Your care partner should take you home directly after the procedure.  You should plan to take it easy, moving slowly for the rest of the day.  You can resume normal activity the day after the procedure however you should NOT DRIVE or use heavy machinery for 24 hours (because of the sedation medicines used during the test).    SYMPTOMS TO REPORT IMMEDIATELY: A gastroenterologist can be reached at any hour.  During normal business hours, 8:30 AM to 5:00 PM Monday through Friday,  call [phone].  After hours and on weekends, please call the GI answering service at [phone] who will take a message and have the physician on call contact you.   Following lower endoscopy (colonoscopy or flexible sigmoidoscopy):  Excessive amounts of blood in the stool  Significant tenderness or worsening of abdominal pains  Swelling of the abdomen that is new, acute  Fever of 100F or higher      FOLLOW UP: If any biopsies were taken you will be contacted by phone or by letter within the next 1-3 weeks.  Call your gastroenterologist if you have not heard about the biopsies in 3 weeks.  Our staff will call the home number listed on your records the next business day following your procedure to check on you and address any questions or concerns that you may have at that time regarding the information given to you following your procedure. This is a courtesy call and so if there is no answer at the home number and we have not heard from you through the emergency physician on call, we will assume that you have returned to your regular daily activities without incident.  SIGNATURES/CONFIDENTIALITY: You and/or your care partner have signed paperwork which will be entered into your electronic medical record.  These signatures attest to the fact that that the information above on your After Visit Summary has been reviewed and is understood.  Full responsibility of  the confidentiality of this discharge information lies with you and/or your care-partner.    Normal colonoscopy  High fiber diet information given,  Follow-up 10 years, 2023

## 2012-06-20 ENCOUNTER — Telehealth: Payer: Self-pay | Admitting: *Deleted

## 2012-06-20 NOTE — Telephone Encounter (Signed)
  Follow up Call-  Call back number 06/17/2012  Post procedure Call Back phone  # [phone]  Permission to leave phone message Yes     Patient questions:  Do you have a fever, pain , or abdominal swelling? no Pain Score  0 *  Have you tolerated food without any problems? yes  Have you been able to return to your normal activities? yes  Do you have any questions about your discharge instructions: Diet   no Medications  no Follow up visit  no  Do you have questions or concerns about your Care? no  Actions: * If pain score is 4 or above: No action needed, pain <4.

## 2012-06-21 ENCOUNTER — Other Ambulatory Visit: Payer: Self-pay | Admitting: Obstetrics and Gynecology

## 2012-06-21 DIAGNOSIS — Z1231 Encounter for screening mammogram for malignant neoplasm of breast: Secondary | ICD-10-CM

## 2012-07-21 ENCOUNTER — Ambulatory Visit
Admission: RE | Admit: 2012-07-21 | Discharge: 2012-07-21 | Disposition: A | Discharge status: Home / Self Care | Payer: Federal, State, Local not specified - PPO | Source: Ambulatory Visit | Attending: Obstetrics and Gynecology | Admitting: Obstetrics and Gynecology

## 2012-07-21 DIAGNOSIS — Z1231 Encounter for screening mammogram for malignant neoplasm of breast: Secondary | ICD-10-CM

## 2012-09-06 ENCOUNTER — Ambulatory Visit (INDEPENDENT_AMBULATORY_CARE_PROVIDER_SITE_OTHER): Payer: Federal, State, Local not specified - PPO

## 2012-09-06 DIAGNOSIS — Z23 Encounter for immunization: Secondary | ICD-10-CM

## 2012-11-09 ENCOUNTER — Encounter: Payer: Self-pay | Admitting: Family Medicine

## 2012-11-09 ENCOUNTER — Ambulatory Visit (INDEPENDENT_AMBULATORY_CARE_PROVIDER_SITE_OTHER): Payer: Federal, State, Local not specified - PPO | Admitting: Family Medicine

## 2012-11-09 ENCOUNTER — Ambulatory Visit (INDEPENDENT_AMBULATORY_CARE_PROVIDER_SITE_OTHER)
Admission: RE | Admit: 2012-11-09 | Discharge: 2012-11-09 | Disposition: A | Discharge status: Home / Self Care | Payer: Federal, State, Local not specified - PPO | Source: Ambulatory Visit | Attending: Family Medicine | Admitting: Family Medicine

## 2012-11-09 VITALS — BP 148/80 | HR 68 | Temp 97.9°F | Wt 128.0 lb

## 2012-11-09 DIAGNOSIS — S46909A Unspecified injury of unspecified muscle, fascia and tendon at shoulder and upper arm level, unspecified arm, initial encounter: Secondary | ICD-10-CM

## 2012-11-09 DIAGNOSIS — M25512 Pain in left shoulder: Secondary | ICD-10-CM

## 2012-11-09 DIAGNOSIS — M25519 Pain in unspecified shoulder: Secondary | ICD-10-CM

## 2012-11-09 DIAGNOSIS — S4980XA Other specified injuries of shoulder and upper arm, unspecified arm, initial encounter: Secondary | ICD-10-CM

## 2012-11-09 DIAGNOSIS — S4992XA Unspecified injury of left shoulder and upper arm, initial encounter: Secondary | ICD-10-CM | POA: Insufficient documentation

## 2012-11-09 MED ORDER — METRONIDAZOLE 0.75 % EX GEL: Freq: Two times a day (BID) | CUTANEOUS | Status: DC

## 2012-11-09 NOTE — Progress Notes (Signed)
SUBJECTIVE: Nicole Henry is a 51 y.o. female who sustained a left shoulder injury 4 month(s) ago. Mechanism of injury: She was running along a curb and twisted her left ankle and fell to the ground. Immediate symptoms: immediate pain. Symptoms have been in some ways worsening since that time.  She feels she cannot put her left arm behind her back without great difficulty and this progressing.  Pain is improving but weakness seems to be progressing.  Prior history of related problems: no prior problems with this area in the past.  Patient Active Problem List  Diagnosis  . HYPOTHYROIDISM  . PURE HYPERCHOLESTEROLEMIA  . ALLERGIC RHINITIS, SEASONAL  . GERD  . ACNE ROSACEA  . BASAL CELL CARCINOMA, HX OF  . Routine general medical examination at a health care facility  . Palpitations  . Injury of left shoulder   Past Medical History  Diagnosis Date  . Rosacea   . Allergic rhinitis due to pollen   . Family history of diabetes mellitus   . GERD (gastroesophageal reflux disease)   . Hypothyroidism   . Pure hypercholesterolemia   . Screening for lipoid disorders   . Basal cell carcinoma     history of   Past Surgical History  Procedure Date  . Breast enhancement surgery 1997   History  Substance Use Topics  . Smoking status: Never Smoker   . Smokeless tobacco: Never Used  . Alcohol Use: No   Family History  Problem Relation Age of Onset  . Hypertension Father   . Prostate cancer Father   . Heart disease Brother     ? heart issue  . Heart attack Paternal Grandfather     late age   Allergies  Allergen Reactions  . Azithromycin Other (See Comments)    GI upset  . Erythromycin Other (See Comments)    GI intolerance  . Nitrofurantoin Other (See Comments)    unknown  . Penicillins Rash  . Sulfonamide Derivatives Rash  . Yellow Dyes (Non-Tartrazine) Rash   Current Outpatient Prescriptions on File Prior to Visit  Medication Sig Dispense Refill  . busPIRone (BUSPAR) 15 MG  tablet Take 0.5 tablets (7.5 mg total) by mouth 2 (two) times daily.  60 tablet  3  . SYNTHROID 50 MCG tablet TAKE 2 TABLET BY MOUTH ONCE DAILY ON MONDAY THRU FRIDAY AND 1 TABLET ONCE DAILY ON SATURDAY AND SUNDAY  48 tablet  11   The PMH, PSH, Social History, Family History, Medications, and allergies have been reviewed in [facility], and have been updated if relevant.   OBJECTIVE: BP 148/80  Pulse 68  Temp 97.9 F (36.6 C)  Wt 128 lb (58.06 kg)  Appearance: alert, well appearing, and in no distress. Shoulder exam: normal to inspection, a little tender to palpation. Neg arch sign Pos empty can test left Pos lift off sign  X-ray: ordered, but results not yet available.  ASSESSMENT: Rotator cuff tear, likely involving subscapularis  PLAN: Xray today, will likely need to proceed with MRI of shoulder. Refer to ortho if she does have a tear, particularly a large tear.  Also discussed PT. Will await results of imaging. The patient indicates understanding of these issues and agrees with the plan.

## 2012-11-09 NOTE — Patient Instructions (Addendum)
Good to see you. Have a Happy Thanksgiving. Please stop by to see Shirlee Limerick after you get your xray done to set up your MRI.

## 2012-12-05 ENCOUNTER — Other Ambulatory Visit: Payer: Self-pay | Admitting: Family Medicine

## 2012-12-05 ENCOUNTER — Ambulatory Visit
Admission: RE | Admit: 2012-12-05 | Discharge: 2012-12-05 | Disposition: A | Discharge status: Home / Self Care | Payer: Federal, State, Local not specified - PPO | Source: Ambulatory Visit | Attending: Family Medicine | Admitting: Family Medicine

## 2012-12-05 DIAGNOSIS — M25512 Pain in left shoulder: Secondary | ICD-10-CM

## 2012-12-05 DIAGNOSIS — M75100 Unspecified rotator cuff tear or rupture of unspecified shoulder, not specified as traumatic: Secondary | ICD-10-CM

## 2013-04-26 ENCOUNTER — Other Ambulatory Visit: Payer: Self-pay | Admitting: Family Medicine

## 2013-05-03 ENCOUNTER — Telehealth: Payer: Self-pay | Admitting: *Deleted

## 2013-05-03 DIAGNOSIS — E7800 Pure hypercholesterolemia, unspecified: Secondary | ICD-10-CM

## 2013-05-03 DIAGNOSIS — E039 Hypothyroidism, unspecified: Secondary | ICD-10-CM

## 2013-05-03 DIAGNOSIS — Z0000 Encounter for general adult medical examination without abnormal findings: Secondary | ICD-10-CM

## 2013-05-03 NOTE — Telephone Encounter (Signed)
Pt has a follow up- renew meds appt on 6/2 and wants labs prior.  Lab appt has been made for 5/30.  Please put in order for tests.

## 2013-05-12 ENCOUNTER — Other Ambulatory Visit (INDEPENDENT_AMBULATORY_CARE_PROVIDER_SITE_OTHER): Payer: Federal, State, Local not specified - PPO

## 2013-05-12 DIAGNOSIS — E7800 Pure hypercholesterolemia, unspecified: Secondary | ICD-10-CM

## 2013-05-12 DIAGNOSIS — Z0000 Encounter for general adult medical examination without abnormal findings: Secondary | ICD-10-CM

## 2013-05-12 DIAGNOSIS — E039 Hypothyroidism, unspecified: Secondary | ICD-10-CM

## 2013-05-12 LAB — LIPID PANEL
HDL: 61.7 mg/dL (ref >39.00)
Total CHOL/HDL Ratio: 4

## 2013-05-12 LAB — COMPREHENSIVE METABOLIC PANEL
ALT: 11 U/L (ref 0–35)
AST: 15 U/L (ref 0–37)
Albumin: 4 g/dL (ref 3.5–5.2)
Calcium: 9.2 mg/dL (ref 8.4–10.5)
Chloride: 106 mEq/L (ref 96–112)
Creatinine, Ser: 0.8 mg/dL (ref 0.4–1.2)
Potassium: 4.4 mEq/L (ref 3.5–5.1)

## 2013-05-12 LAB — TSH: TSH: 1.87 u[IU]/mL (ref 0.35–5.50)

## 2013-05-12 LAB — T4, FREE: Free T4: 1.02 ng/dL (ref 0.60–1.60)

## 2013-05-15 ENCOUNTER — Ambulatory Visit: Payer: Federal, State, Local not specified - PPO | Admitting: Family Medicine

## 2013-05-18 ENCOUNTER — Encounter: Payer: Self-pay | Admitting: Family Medicine

## 2013-05-18 ENCOUNTER — Ambulatory Visit (INDEPENDENT_AMBULATORY_CARE_PROVIDER_SITE_OTHER): Payer: Federal, State, Local not specified - PPO | Admitting: Family Medicine

## 2013-05-18 VITALS — BP 126/78 | HR 80 | Temp 97.6°F | Wt 128.0 lb

## 2013-05-18 DIAGNOSIS — E7800 Pure hypercholesterolemia, unspecified: Secondary | ICD-10-CM

## 2013-05-18 DIAGNOSIS — E039 Hypothyroidism, unspecified: Secondary | ICD-10-CM

## 2013-05-18 MED ORDER — SIMVASTATIN 10 MG PO TABS: 10.0000 mg | ORAL_TABLET | Freq: Every day | ORAL | Status: DC

## 2013-05-18 MED ORDER — SYNTHROID 50 MCG PO TABS: ORAL_TABLET | ORAL | Status: DC

## 2013-05-18 NOTE — Addendum Note (Signed)
Addended by: Eliezer Bottom on: 05/18/2013 08:19 AM   Modules accepted: Orders

## 2013-05-18 NOTE — Patient Instructions (Signed)
Good to see you. We are starting simvastatin 10 mg nightly.  Please come back in 4-8 weeks to have labs rechecked.

## 2013-05-18 NOTE — Progress Notes (Signed)
Subjective:    Patient ID: Nicole Henry, female    DOB: Jan 18, 1961, 52 y.o.   MRN: [id number]  HPI  52 yo here for follow up and renew meds.   HLD- LDL increased.  She has been exercising and working on diet- diet has been quite strict.  Does have strong FH of HLD. Lab Results  Component Value Date   CHOL 235* 05/12/2013   HDL 61.70 05/12/2013   LDLCALC 128* 03/13/2009   LDLDIRECT 151.4 05/12/2013   TRIG 108.0 05/12/2013   CHOLHDL 4 05/12/2013     Hypothyroidism- thyroid panel within normal limits. On Synthroid 50 mcg daily. Denies any symptoms of hypo or hyperthyroidism. Lab Results  Component Value Date   TSH 1.87 05/12/2013     Has GYN- Dr. Huel Cote.  UTD on most prevention- due for first screening colonoscopy.  Patient Active Problem List   Diagnosis Date Noted  . Palpitations 04/18/2012  . 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 PURE HYPERCHOLESTEROLEMIA 03/13/2009  . GERD 11/22/2008  . BASAL CELL CARCINOMA, HX OF 11/22/2008  . HYPOTHYROIDISM 01/14/2008  . ALLERGIC RHINITIS, SEASONAL 01/14/2008   Past Medical History  Diagnosis Date  . Rosacea   . Allergic rhinitis due to pollen   . Family history of diabetes mellitus   . GERD (gastroesophageal reflux disease)   . Hypothyroidism   . Pure hypercholesterolemia   . Screening for lipoid disorders   . Basal cell carcinoma     history of   Past Surgical History  Procedure Laterality Date  . Breast enhancement surgery  1997   History  Substance Use Topics  . Smoking status: Never Smoker   . Smokeless tobacco: Never Used  . Alcohol Use: No   Family History  Problem Relation Age of Onset  . Hypertension Father   . Prostate cancer Father   . Heart disease Brother     ? heart issue  . Heart attack Paternal Grandfather     late age   Allergies  Allergen Reactions  . Azithromycin Other (See Comments)    GI upset  . Erythromycin Other (See Comments)    GI intolerance  . Nitrofurantoin Other (See Comments)     unknown  . Penicillins Rash  . Sulfonamide Derivatives Rash  . Yellow Dyes (Non-Tartrazine) Rash   Current Outpatient Prescriptions on File Prior to Visit  Medication Sig Dispense Refill  . metroNIDAZOLE (METROGEL) 0.75 % gel Apply topically 2 (two) times daily.  45 g  0  . SYNTHROID 50 MCG tablet take 2 tablets by mouth once daily MONDAY THROUGH FRIDAY AND 1 TABLET ONCE DAILY ON SATURDAY AND SUNDAY  48 tablet  0   No current facility-administered medications on file prior to visit.   The PMH, PSH, Social History, Family History, Medications, and allergies have been reviewed in [facility], and have been updated if relevant.    See HPI Patient reports no  vision/ hearing changes,anorexia, weight change, fever ,adenopathy, persistant / recurrent hoarseness, swallowing issues, chest pain, edema,persistant / recurrent cough, hemoptysis, dyspnea(rest, exertional, paroxysmal nocturnal), gastrointestinal  bleeding (melena, rectal bleeding), abdominal pain, excessive heart burn, GU symptoms(dysuria, hematuria, pyuria, voiding/incontinence  Issues) syncope, focal weakness, severe memory loss, concerning skin lesions, depression, anxiety, abnormal bruising/bleeding, major joint swelling, breast masses or abnormal vaginal bleeding.    Objective:   Physical Exam BP 126/78  Pulse 80  Temp(Src) 97.6 F (36.4 C)  Wt 128 lb (58.06 kg)  BMI 21.96 kg/m2  General:  Well-developed,well-nourished,in no acute distress;  alert,appropriate and cooperative throughout examination Head:  normocephalic and atraumatic.   Eyes:  vision grossly intact, pupils equal, pupils round, and pupils reactive to light.   Ears:  R ear normal and L ear normal.   Nose:  no external deformity.   Mouth:  good dentition.   Neck:  No deformities, masses, or tenderness noted. Lungs:  Normal respiratory effort, chest expands symmetrically. Lungs are clear to auscultation, no crackles or wheezes. Heart:  Normal rate and regular rhythm.  S1 and S2 normal without gallop, murmur, click, rub or other extra sounds. Abdomen:  Bowel sounds positive,abdomen soft and non-tender without masses, organomegaly or hernias noted. Msk:  No deformity or scoliosis noted of thoracic or lumbar spine.   Extremities:  No clubbing, cyanosis, edema, or deformity noted with normal full range of motion of all joints.   Neurologic:  alert & oriented X3 and gait normal.   Skin:  Intact without suspicious lesions or rashes Cervical Nodes:  No lymphadenopathy noted Axillary Nodes:  No palpable lymphadenopathy Psych:  Cognition and judgment appear intact. Alert and cooperative with normal attention span and concentration. No apparent delusions, illusions, hallucinations      Assessment & Plan:   1. HYPOTHYROIDISM Stable.  Rx refilled.  2. Pure hypercholesterolemia Deteriorated and she has a very healthy lifestyle. Will start simvastatin 10 mg qhs, follow up labs in 4-8 weeks. - Comprehensive metabolic panel; Future - Lipid Panel; Future

## 2013-09-21 ENCOUNTER — Ambulatory Visit (INDEPENDENT_AMBULATORY_CARE_PROVIDER_SITE_OTHER): Payer: Federal, State, Local not specified - PPO

## 2013-09-21 DIAGNOSIS — Z23 Encounter for immunization: Secondary | ICD-10-CM

## 2014-05-15 ENCOUNTER — Telehealth: Payer: Self-pay

## 2014-05-15 MED ORDER — SCOPOLAMINE 1 MG/3DAYS TD PT72: 1.0000 | MEDICATED_PATCH | TRANSDERMAL | Status: DC

## 2014-05-15 NOTE — Telephone Encounter (Signed)
Pt going on cruise for 6 days and request sea sick patches to [place] for cruise in 2 weeks.

## 2014-05-15 NOTE — Telephone Encounter (Signed)
Rx sent 

## 2014-05-22 ENCOUNTER — Other Ambulatory Visit: Payer: Self-pay | Admitting: Family Medicine

## 2014-06-29 ENCOUNTER — Other Ambulatory Visit: Payer: Self-pay | Admitting: Family Medicine

## 2014-07-16 ENCOUNTER — Ambulatory Visit (HOSPITAL_COMMUNITY): Payer: Federal, State, Local not specified - PPO | Attending: Cardiology | Admitting: Radiology

## 2014-07-16 ENCOUNTER — Encounter: Payer: Self-pay | Admitting: Family Medicine

## 2014-07-16 ENCOUNTER — Other Ambulatory Visit (HOSPITAL_COMMUNITY): Payer: Self-pay | Admitting: Cardiology

## 2014-07-16 ENCOUNTER — Ambulatory Visit (INDEPENDENT_AMBULATORY_CARE_PROVIDER_SITE_OTHER): Payer: Federal, State, Local not specified - PPO | Admitting: Family Medicine

## 2014-07-16 VITALS — BP 118/74 | HR 72 | Temp 98.0°F | Ht 63.0 in | Wt 134.0 lb

## 2014-07-16 DIAGNOSIS — J301 Allergic rhinitis due to pollen: Secondary | ICD-10-CM

## 2014-07-16 DIAGNOSIS — E039 Hypothyroidism, unspecified: Secondary | ICD-10-CM

## 2014-07-16 DIAGNOSIS — E785 Hyperlipidemia, unspecified: Secondary | ICD-10-CM

## 2014-07-16 DIAGNOSIS — R2242 Localized swelling, mass and lump, left lower limb: Secondary | ICD-10-CM

## 2014-07-16 DIAGNOSIS — R2240 Localized swelling, mass and lump, unspecified lower limb: Secondary | ICD-10-CM | POA: Insufficient documentation

## 2014-07-16 DIAGNOSIS — M7989 Other specified soft tissue disorders: Secondary | ICD-10-CM

## 2014-07-16 DIAGNOSIS — K219 Gastro-esophageal reflux disease without esophagitis: Secondary | ICD-10-CM

## 2014-07-16 DIAGNOSIS — R229 Localized swelling, mass and lump, unspecified: Secondary | ICD-10-CM

## 2014-07-16 DIAGNOSIS — E7800 Pure hypercholesterolemia, unspecified: Secondary | ICD-10-CM

## 2014-07-16 LAB — COMPREHENSIVE METABOLIC PANEL
ALT: 18 U/L (ref 0–35)
AST: 29 U/L (ref 0–37)
Albumin: 4 g/dL (ref 3.5–5.2)
Alkaline Phosphatase: 65 U/L (ref 39–117)
BUN: 14 mg/dL (ref 6–23)
CHLORIDE: 100 meq/L (ref 96–112)
CO2: 31 mEq/L (ref 19–32)
CREATININE: 0.8 mg/dL (ref 0.4–1.2)
Calcium: 8.9 mg/dL (ref 8.4–10.5)
GFR: 82.21 mL/min (ref >60.00)
Glucose, Bld: 90 mg/dL (ref 70–99)
Potassium: 4.4 mEq/L (ref 3.5–5.1)
SODIUM: 136 meq/L (ref 135–145)
Total Bilirubin: 0.5 mg/dL (ref 0.2–1.2)
Total Protein: 7.3 g/dL (ref 6.0–8.3)

## 2014-07-16 LAB — LIPID PANEL
CHOLESTEROL: 285 mg/dL — AB (ref 0–200)
HDL: 68.4 mg/dL (ref >39.00)
LDL Cholesterol: 199 mg/dL — ABNORMAL HIGH (ref 0–99)
NonHDL: 216.6
Total CHOL/HDL Ratio: 4
Triglycerides: 88 mg/dL (ref 0.0–149.0)
VLDL: 17.6 mg/dL (ref 0.0–40.0)

## 2014-07-16 LAB — T4, FREE: FREE T4: 0.33 ng/dL — AB (ref 0.60–1.60)

## 2014-07-16 LAB — TSH: TSH: 89.18 u[IU]/mL — ABNORMAL HIGH (ref 0.35–4.50)

## 2014-07-16 MED ORDER — SYNTHROID 50 MCG PO TABS
ORAL_TABLET | ORAL | Status: DC
Start: 2014-07-16 — End: 2014-10-16

## 2014-07-16 NOTE — Assessment & Plan Note (Signed)
Likely deteriorated since she stopped taking simvastatin. Added simvastatin to allergy list as an intolerance due to myalgias. Will recheck lipid panel. Discussed starting another statin, possibly low dose pravachol.  She will consider this based on lab results from today.

## 2014-07-16 NOTE — Progress Notes (Signed)
Left lower extremity venous Duplex performed.

## 2014-07-16 NOTE — Progress Notes (Signed)
Pre visit review using our clinic review tool, if applicable. No additional management support is needed unless otherwise documented below in the visit note. 

## 2014-07-16 NOTE — Progress Notes (Signed)
Subjective:    Patient ID: Nicole Henry, female    DOB: 1961/07/26, 53 y.o.   MRN: [id number]  HPI  53 yo here for follow up and renew meds.   HLD- overdue for labs.  Does have strong FH of HLD. Started her on simvastatin last summer but she stopped taking it due to myalgias and was lost to follow up. Lab Results  Component Value Date   CHOL 235* 05/12/2013   HDL 61.70 05/12/2013   LDLCALC 128* 03/13/2009   LDLDIRECT 151.4 05/12/2013   TRIG 108.0 05/12/2013   CHOLHDL 4 05/12/2013   Wt Readings from Last 3 Encounters:  07/16/14 134 lb (60.782 kg)  05/18/13 128 lb (58.06 kg)  11/09/12 128 lb (58.06 kg)    Hypothyroidism- overdue for labs.  Has been out of her synthroid for 2 1/2 weeks.  On Synthroid 50 mcg daily. Has noticed some exercise intolerance she she ran out.  Denies any other symptoms of hypothyroidism.  Lab Results  Component Value Date   TSH 1.87 05/12/2013     Left leg mass- was on a plane flying to [place] and noticed a painful mass on back of her left thigh.  She feels like it was probably there before the trip but was very obvious during that flight.  Since the trip, she mainly feels it when she has been sitting for long periods of time.  No pain with walking.  No LE edema.  No redness or warmth.  No CP or SOB.        Patient Active Problem List   Diagnosis Date Noted  . Palpitations 04/18/2012  . 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 PURE HYPERCHOLESTEROLEMIA 03/13/2009  . GERD 11/22/2008  . BASAL CELL CARCINOMA, HX OF 11/22/2008  . HYPOTHYROIDISM 01/14/2008  . ALLERGIC RHINITIS, SEASONAL 01/14/2008   Past Medical History  Diagnosis Date  . Rosacea   . Allergic rhinitis due to pollen   . Family history of diabetes mellitus   . GERD (gastroesophageal reflux disease)   . Hypothyroidism   . Pure hypercholesterolemia   . Screening for lipoid disorders   . Basal cell carcinoma     history of   Past Surgical History  Procedure Laterality Date  . Breast  enhancement surgery  1997   History  Substance Use Topics  . Smoking status: Never Smoker   . Smokeless tobacco: Never Used  . Alcohol Use: No   Family History  Problem Relation Age of Onset  . Hypertension Father   . Prostate cancer Father   . Heart disease Brother     ? heart issue  . Heart attack Paternal Grandfather     late age   Allergies  Allergen Reactions  . Azithromycin Other (See Comments)    GI upset  . Erythromycin Other (See Comments)    GI intolerance  . Nitrofurantoin Other (See Comments)    unknown  . Penicillins Rash  . Sulfonamide Derivatives Rash  . Yellow Dyes (Non-Tartrazine) Rash   No current outpatient prescriptions on file prior to visit.   No current facility-administered medications on file prior to visit.   The PMH, PSH, Social History, Family History, Medications, and allergies have been reviewed in [facility], and have been updated if relevant.    See HPI No changes in bowel habits or skin No CP or SOB No tremor No anxiety No palpitations No depression No difficulty swallowing  Objective:   Physical Exam BP 118/74  Pulse 72  Temp(Src) 98 F (  36.7 C) (Oral)  Ht 5\' 3"  (1.6 m)  Wt 134 lb (60.782 kg)  BMI 23.74 kg/m2  SpO2 98%  General:  Well-developed,well-nourished,in no acute distress; alert,appropriate and cooperative throughout examination Head:  normocephalic and atraumatic.   Eyes:  vision grossly intact, pupils equal, pupils round, and pupils reactive to light.   Ears:  R ear normal and L ear normal.   Nose:  no external deformity.   Mouth:  good dentition.   Neck:  No deformities, masses, or tenderness noted. Lungs:  Normal respiratory effort, chest expands symmetrically. Lungs are clear to auscultation, no crackles or wheezes. Heart:  Normal rate and regular rhythm. S1 and S2 normal without gallop, murmur, click, rub or other extra sounds. Abdomen:  Bowel sounds positive,abdomen soft and non-tender without masses,  organomegaly or hernias noted. Msk:  No deformity or scoliosis noted of thoracic or lumbar spine.   Extremities:   No LE edema Small palpable subcutaneous mass on back of left thigh, no redness or warmth, NTTP.   Neurologic:  alert & oriented X3 and gait normal.   No tremor Skin:  Intact without suspicious lesions or rashes Psych:  Cognition and judgment appear intact. Alert and cooperative with normal attention span and concentration. No apparent delusions, illusions, hallucinations      Assessment & Plan:

## 2014-07-16 NOTE — Assessment & Plan Note (Signed)
New- US doppler of left leg to rule out DVT. Exam reassuring but cannot exclude a DVT at this point. The patient indicates understanding of these issues and agrees with the plan.

## 2014-07-16 NOTE — Patient Instructions (Addendum)
Great to see you. We will call you with your lab results.  Please stop by to see Rosaria Ferries on your way out to set up your ultrasound.  I will call you with those results.

## 2014-07-16 NOTE — Assessment & Plan Note (Signed)
May be hypothyroid on labs today since she ran out of synthroid 2 1/2 weeks ago.  Will check labs today. Refill synthroid. Orders Placed This Encounter  Procedures  . Lipid panel  . TSH  . T4, Free  . Comprehensive metabolic panel  . Lower Extremity Venous Duplex Left

## 2014-07-17 ENCOUNTER — Other Ambulatory Visit: Payer: Self-pay | Admitting: *Deleted

## 2014-07-17 NOTE — Addendum Note (Signed)
Addended by: Modena Nunnery on: 07/17/2014 10:26 AM   Modules accepted: Orders

## 2014-07-17 NOTE — Telephone Encounter (Signed)
Previously spoke to pt and informed her of results. You were wanting her to start pravachol but the mg was not specified. Pt is requesting meds be sent to pharmacy on file

## 2014-07-18 MED ORDER — PRAVASTATIN SODIUM 20 MG PO TABS: 20.0000 mg | ORAL_TABLET | Freq: Every day | ORAL | Status: DC

## 2014-07-18 NOTE — Telephone Encounter (Signed)
Rx entered.  Please call pharmacist to see if she can take this- she has yellow dye listed as an allergy.

## 2014-07-18 NOTE — Telephone Encounter (Signed)
Noted.  Will send rx.  Please inform pt.

## 2014-07-18 NOTE — Telephone Encounter (Signed)
I spoke to the pharmacist and she stated that pravachol is a yellow tab, and there is a possibility that she will have an adverse reaction, especially since she was unable to tolerate simvastatin. She states that since the reaction was not life threatening, you could try it if you want to see if she does or does not.

## 2014-07-18 NOTE — Telephone Encounter (Signed)
Spoke to pt and informed her Rx has been sent to requested pharmacy. Pt states that she will discuss it with the pharmacist when she arrives and if there are any problems, she will contact us back

## 2014-07-19 ENCOUNTER — Telehealth: Payer: Self-pay | Admitting: Family Medicine

## 2014-07-19 DIAGNOSIS — R7989 Other specified abnormal findings of blood chemistry: Secondary | ICD-10-CM

## 2014-07-19 NOTE — Telephone Encounter (Signed)
Pt called requesting a call back. Specified that you call her only.  Call back [phone]

## 2014-07-20 ENCOUNTER — Other Ambulatory Visit: Payer: Self-pay | Admitting: Family Medicine

## 2014-07-20 DIAGNOSIS — R7989 Other specified abnormal findings of blood chemistry: Secondary | ICD-10-CM

## 2014-07-20 NOTE — Telephone Encounter (Signed)
Talked with pt.  She would like referral to endocrinologist.  Referral placed.

## 2014-07-25 ENCOUNTER — Other Ambulatory Visit (INDEPENDENT_AMBULATORY_CARE_PROVIDER_SITE_OTHER): Payer: Federal, State, Local not specified - PPO

## 2014-07-25 ENCOUNTER — Other Ambulatory Visit: Payer: Self-pay | Admitting: Family Medicine

## 2014-07-25 DIAGNOSIS — E785 Hyperlipidemia, unspecified: Secondary | ICD-10-CM

## 2014-07-25 DIAGNOSIS — E039 Hypothyroidism, unspecified: Secondary | ICD-10-CM

## 2014-07-25 DIAGNOSIS — R7989 Other specified abnormal findings of blood chemistry: Secondary | ICD-10-CM

## 2014-07-25 DIAGNOSIS — R946 Abnormal results of thyroid function studies: Secondary | ICD-10-CM

## 2014-07-25 LAB — T3, FREE: T3, Free: 2.7 pg/mL (ref 2.3–4.2)

## 2014-07-25 LAB — T4, FREE: Free T4: 1.28 ng/dL (ref 0.60–1.60)

## 2014-07-25 LAB — TSH: TSH: 11.3 u[IU]/mL — AB (ref 0.35–4.50)

## 2014-08-08 ENCOUNTER — Other Ambulatory Visit (INDEPENDENT_AMBULATORY_CARE_PROVIDER_SITE_OTHER): Payer: Federal, State, Local not specified - PPO

## 2014-08-08 DIAGNOSIS — E785 Hyperlipidemia, unspecified: Secondary | ICD-10-CM

## 2014-08-08 DIAGNOSIS — E039 Hypothyroidism, unspecified: Secondary | ICD-10-CM

## 2014-08-08 LAB — LIPID PANEL
CHOLESTEROL: 162 mg/dL (ref 0–200)
HDL: 52.3 mg/dL (ref >39.00)
LDL Cholesterol: 92 mg/dL (ref 0–99)
NonHDL: 109.7
Total CHOL/HDL Ratio: 3
Triglycerides: 89 mg/dL (ref 0.0–149.0)
VLDL: 17.8 mg/dL (ref 0.0–40.0)

## 2014-08-08 LAB — T4, FREE: Free T4: 1.29 ng/dL (ref 0.60–1.60)

## 2014-08-08 LAB — TSH: TSH: 0.38 u[IU]/mL (ref 0.35–4.50)

## 2014-08-09 ENCOUNTER — Encounter: Payer: Self-pay | Admitting: Family Medicine

## 2014-08-09 LAB — T3: T3 TOTAL: 124.2 ng/dL (ref 80.0–204.0)

## 2014-08-14 ENCOUNTER — Other Ambulatory Visit: Payer: Federal, State, Local not specified - PPO

## 2014-08-24 ENCOUNTER — Encounter: Payer: Self-pay | Admitting: Family Medicine

## 2014-08-24 ENCOUNTER — Ambulatory Visit (INDEPENDENT_AMBULATORY_CARE_PROVIDER_SITE_OTHER): Payer: Federal, State, Local not specified - PPO | Admitting: Family Medicine

## 2014-08-24 VITALS — BP 110/64 | HR 68 | Temp 97.6°F | Wt 134.0 lb

## 2014-08-24 DIAGNOSIS — R5381 Other malaise: Secondary | ICD-10-CM

## 2014-08-24 DIAGNOSIS — R5383 Other fatigue: Secondary | ICD-10-CM

## 2014-08-24 DIAGNOSIS — E7800 Pure hypercholesterolemia, unspecified: Secondary | ICD-10-CM

## 2014-08-24 DIAGNOSIS — R0609 Other forms of dyspnea: Secondary | ICD-10-CM

## 2014-08-24 DIAGNOSIS — R002 Palpitations: Secondary | ICD-10-CM

## 2014-08-24 DIAGNOSIS — E039 Hypothyroidism, unspecified: Secondary | ICD-10-CM

## 2014-08-24 DIAGNOSIS — R42 Dizziness and giddiness: Secondary | ICD-10-CM

## 2014-08-24 DIAGNOSIS — R0600 Dyspnea, unspecified: Secondary | ICD-10-CM | POA: Insufficient documentation

## 2014-08-24 DIAGNOSIS — R0989 Other specified symptoms and signs involving the circulatory and respiratory systems: Secondary | ICD-10-CM

## 2014-08-24 LAB — CBC WITH DIFFERENTIAL/PLATELET
BASOS PCT: 0.5 % (ref 0.0–3.0)
Basophils Absolute: 0 10*3/uL (ref 0.0–0.1)
EOS PCT: 2.6 % (ref 0.0–5.0)
Eosinophils Absolute: 0.1 10*3/uL (ref 0.0–0.7)
HEMATOCRIT: 38.9 % (ref 36.0–46.0)
Hemoglobin: 12.8 g/dL (ref 12.0–15.0)
LYMPHS ABS: 1.7 10*3/uL (ref 0.7–4.0)
Lymphocytes Relative: 42.9 % (ref 12.0–46.0)
MCHC: 32.8 g/dL (ref 30.0–36.0)
MCV: 88.1 fl (ref 78.0–100.0)
MONO ABS: 0.5 10*3/uL (ref 0.1–1.0)
Monocytes Relative: 11.2 % (ref 3.0–12.0)
NEUTROS ABS: 1.7 10*3/uL (ref 1.4–7.7)
Neutrophils Relative %: 42.8 % — ABNORMAL LOW (ref 43.0–77.0)
Platelets: 314 10*3/uL (ref 150.0–400.0)
RBC: 4.42 Mil/uL (ref 3.87–5.11)
RDW: 14.2 % (ref 11.5–15.5)
WBC: 4.1 10*3/uL (ref 4.0–10.5)

## 2014-08-24 LAB — T4, FREE: Free T4: 1.12 ng/dL (ref 0.60–1.60)

## 2014-08-24 LAB — TSH: TSH: 0.51 u[IU]/mL (ref 0.35–4.50)

## 2014-08-24 MED ORDER — PRAVASTATIN SODIUM 10 MG PO TABS: ORAL_TABLET | ORAL | Status: DC

## 2014-08-24 NOTE — Assessment & Plan Note (Signed)
New- see above. EKG reassuring but will proceed with stress test/card eval for further risk stratification.

## 2014-08-24 NOTE — Patient Instructions (Addendum)
Good to see you. We are decreasing your pravastatin to 10 mg every other day. Let's plan on checking your cholesterol again in 2 months.  We will call you with your appointment for your stress test/cardiology appointment.  I will call you with your lab results.

## 2014-08-24 NOTE — Progress Notes (Signed)
Subjective:   Patient ID: Nicole Henry, female    DOB: Mar 15, 1961, 53 y.o.   MRN: [id number]  Nicole Henry is a pleasant 53 y.o. year old female with h/o hypothyroidism, hyperlipidemia, who presents to clinic today with Follow-up  on 08/24/2014  HPI: She sent me the following email on 8/27-  I experienced difficulty this weekend on a hiking trip. I experienced increased heart rate, dizziness and fatigue while walking the inclines. Walking flat was no problem. Experienced near fainting at one point. Could this be related to the cholesterol med or should I see someone for an EKG or stress test?  She restarted taking a statin, this time pravachol 20 mg daily, 1 month ago.  LDL was elevated - 199 but her TSH was extremely elevated at 89 which certainly was effecting her cholesterol values.  Thyroid function was also abnormal but she had stopped taking her synthroid. Since restarting both, lab work looked great.   Since the hiking trip, no further symptoms but her symptoms were quite pronounced- as soon as she had to go up an incline, she was SOB, dizzy.  No Cp but did have palpitations.  Typically has great exercise tolerance.  She would rest and then start back up the incline and had similar symptoms.  Lab Results  Component Value Date   CHOL 162 08/08/2014   HDL 52.30 08/08/2014   LDLCALC 92 08/08/2014   LDLDIRECT 151.4 05/12/2013   TRIG 89.0 08/08/2014   CHOLHDL 3 08/08/2014   Lab Results  Component Value Date   TSH 0.38 08/08/2014   Lab Results  Component Value Date   WBC 6.2 08/09/2007   HGB 12.9 08/09/2007   HCT 37.4 08/09/2007   MCV 87.4 08/09/2007   PLT 283 08/09/2007    Current Outpatient Prescriptions on File Prior to Visit  Medication Sig Dispense Refill  . pravastatin (PRAVACHOL) 20 MG tablet Take 1 tablet (20 mg total) by mouth daily.  90 tablet  3  . SYNTHROID 50 MCG tablet TAKE 2 TABLETS BY MOUTH DAILY MONDAY THROUGH FRIDAY AND 1 TABLET ONCE DAILY ON SATURDAY AND  SUNDAY  48 tablet  2   No current facility-administered medications on file prior to visit.    Allergies  Allergen Reactions  . Azithromycin Other (See Comments)    GI upset  . Erythromycin Other (See Comments)    GI intolerance  . Nitrofurantoin Other (See Comments)    unknown  . Simvastatin     myaglias  . Penicillins Rash  . Sulfonamide Derivatives Rash  . Yellow Dyes (Non-Tartrazine) Rash    Past Medical History  Diagnosis Date  . Rosacea   . Allergic rhinitis due to pollen   . Family history of diabetes mellitus   . GERD (gastroesophageal reflux disease)   . Hypothyroidism   . Pure hypercholesterolemia   . Screening for lipoid disorders   . Basal cell carcinoma     history of    Past Surgical History  Procedure Laterality Date  . Breast enhancement surgery  1997    Family History  Problem Relation Age of Onset  . Hypertension Father   . Prostate cancer Father   . Heart disease Brother     ? heart issue  . Heart attack Paternal Grandfather     late age    History   Social History  . Marital Status: Married    Spouse Name: N/A    Number of Children: 2  . Years  of Education: N/A   Occupational History  .      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 Smoking status: Never Smoker   . Smokeless tobacco: Never Used  . Alcohol Use: No  . Drug Use: No  . Sexual Activity: Not on file   Other Topics Concern  . Not on file   Social History Narrative   Regular exercise- yes, running 2 to 3 times a week.   Diet- fruits and veggies.   The PMH, PSH, Social History, Family History, Medications, and allergies have been reviewed in [facility], and have been updated if relevant.   Review of Systems    See HPI No syncope No HA No blurred vision No SOB No LE edema Objective:    BP 110/64  Pulse 68  Temp(Src) 97.6 F (36.4 C) (Oral)  Wt 134 lb (60.782 kg)  SpO2 98%   Physical Exam  Nursing note and vitals  reviewed. Constitutional: She is oriented to person, place, and time. She appears well-developed and well-nourished. No distress.  HENT:  Head: Normocephalic.  Cardiovascular: Normal rate, regular rhythm and normal heart sounds.   Pulmonary/Chest: Effort normal and breath sounds normal. No respiratory distress.  Neurological: She is alert and oriented to person, place, and time.  Skin: Skin is warm and dry.  Psychiatric: She has a normal mood and affect. Her behavior is normal. Judgment and thought content normal.          Assessment & Plan:   Pure hypercholesterolemia  Other malaise and fatigue  Palpitations No Follow-up on file.

## 2014-08-24 NOTE — Assessment & Plan Note (Signed)
New- with DOE. EKG reassuring- NSR. Will check labs today- rule out anemia, thyroid over correction. Refer to cardiology for stress test. The patient indicates understanding of these issues and agrees with the plan.

## 2014-08-24 NOTE — Progress Notes (Signed)
Pre visit review using our clinic review tool, if applicable. No additional management support is needed unless otherwise documented below in the visit note. 

## 2014-08-24 NOTE — Assessment & Plan Note (Signed)
Improved. LDL certainly impacted by elevated TSH but her cholesterol was slowly increasing for some time. Will decrease pravastatin to 10 mg every other day. Repeat cholesterol in 2 months. The patient indicates understanding of these issues and agrees with the plan.

## 2014-09-10 ENCOUNTER — Telehealth: Payer: Self-pay | Admitting: Family Medicine

## 2014-09-10 NOTE — Telephone Encounter (Signed)
Sabrina from Dr Gwenyth Ober office called to let you know that this patient cancelled her consult appt because she told them that she doesn't need a consult she just needs a Stress Test. They explainedt that their Dr's want to see the patient first to determine what kind of Stress Test they need. Just wanted to let you know.

## 2014-09-11 ENCOUNTER — Ambulatory Visit: Payer: Federal, State, Local not specified - PPO | Admitting: Cardiovascular Disease

## 2014-09-20 LAB — HM MAMMOGRAPHY

## 2014-10-16 ENCOUNTER — Other Ambulatory Visit: Payer: Self-pay | Admitting: Family Medicine

## 2015-02-04 ENCOUNTER — Other Ambulatory Visit: Payer: Self-pay | Admitting: Family Medicine

## 2015-04-30 ENCOUNTER — Other Ambulatory Visit (INDEPENDENT_AMBULATORY_CARE_PROVIDER_SITE_OTHER): Payer: Federal, State, Local not specified - PPO

## 2015-04-30 DIAGNOSIS — E785 Hyperlipidemia, unspecified: Secondary | ICD-10-CM

## 2015-04-30 DIAGNOSIS — E039 Hypothyroidism, unspecified: Secondary | ICD-10-CM | POA: Diagnosis not present

## 2015-04-30 DIAGNOSIS — E038 Other specified hypothyroidism: Secondary | ICD-10-CM | POA: Diagnosis not present

## 2015-04-30 LAB — COMPREHENSIVE METABOLIC PANEL
ALK PHOS: 92 U/L (ref 39–117)
ALT: 13 U/L (ref 0–35)
AST: 18 U/L (ref 0–37)
Albumin: 4.4 g/dL (ref 3.5–5.2)
BUN: 10 mg/dL (ref 6–23)
CO2: 32 meq/L (ref 19–32)
CREATININE: 0.77 mg/dL (ref 0.40–1.20)
Calcium: 9.9 mg/dL (ref 8.4–10.5)
Chloride: 103 mEq/L (ref 96–112)
GFR: 83.19 mL/min (ref >60.00)
GLUCOSE: 78 mg/dL (ref 70–99)
Potassium: 4 mEq/L (ref 3.5–5.1)
SODIUM: 139 meq/L (ref 135–145)
TOTAL PROTEIN: 7.5 g/dL (ref 6.0–8.3)
Total Bilirubin: 0.4 mg/dL (ref 0.2–1.2)

## 2015-04-30 LAB — T4, FREE: Free T4: 1.22 ng/dL (ref 0.60–1.60)

## 2015-04-30 LAB — LIPID PANEL
CHOL/HDL RATIO: 4
Cholesterol: 259 mg/dL — ABNORMAL HIGH (ref 0–200)
HDL: 63.8 mg/dL (ref >39.00)
LDL CALC: 175 mg/dL — AB (ref 0–99)
NONHDL: 195.2
Triglycerides: 99 mg/dL (ref 0.0–149.0)
VLDL: 19.8 mg/dL (ref 0.0–40.0)

## 2015-04-30 LAB — TSH: TSH: 0.71 u[IU]/mL (ref 0.35–4.50)

## 2015-05-02 ENCOUNTER — Ambulatory Visit: Payer: Federal, State, Local not specified - PPO | Admitting: Family Medicine

## 2015-05-06 ENCOUNTER — Encounter: Payer: Self-pay | Admitting: Family Medicine

## 2015-05-06 ENCOUNTER — Ambulatory Visit (INDEPENDENT_AMBULATORY_CARE_PROVIDER_SITE_OTHER): Payer: Federal, State, Local not specified - PPO | Admitting: Family Medicine

## 2015-05-06 VITALS — BP 110/70 | HR 70 | Temp 98.1°F | Wt 137.5 lb

## 2015-05-06 DIAGNOSIS — E7800 Pure hypercholesterolemia, unspecified: Secondary | ICD-10-CM

## 2015-05-06 DIAGNOSIS — E039 Hypothyroidism, unspecified: Secondary | ICD-10-CM | POA: Diagnosis not present

## 2015-05-06 DIAGNOSIS — R002 Palpitations: Secondary | ICD-10-CM

## 2015-05-06 MED ORDER — SYNTHROID 50 MCG PO TABS: ORAL_TABLET | ORAL | Status: DC

## 2015-05-06 NOTE — Progress Notes (Signed)
Subjective:   Patient ID: Nicole Henry, female    DOB: [birth year], 54 y.o.   MRN: [id number]  Nicole Henry is a pleasant 54 y.o. year old female with h/o hypothyroidism, hyperlipidemia, who presents to clinic today with Follow-up  on 05/06/2015  HPI: She sent me the following email on 8/27-  I experienced difficulty this weekend on a hiking trip. I experienced increased heart rate, dizziness and fatigue while walking the inclines. Walking flat was no problem. Experienced near fainting at one point. Could this be related to the cholesterol med or should I see someone for an EKG or stress test?  EKG in our office on 08/24/14 reassuring- NSR. I referred her to cardiology for further evaluation/ ?stress echo, but she did not keep that appointment.  Still having intermittent symptoms when she is on an incline.  Also noticed it laying back in the dentist chair. No CP.  Feels she still has good exercise tolerance.  She restarted taking a statin, this time pravachol 20 mg daily at that time but then stopped taking it because it too was making her feel a little achy.  Simvastatin made her feel worse.  LDL now elevated again and she saw this on mychart and started taking pravachol 5 mg daily last week.  Lab Results  Component Value Date   CHOL 259* 04/30/2015   HDL 63.80 04/30/2015   LDLCALC 175* 04/30/2015   LDLDIRECT 151.4 05/12/2013   TRIG 99.0 04/30/2015   CHOLHDL 4 04/30/2015    Hypothyroidism- Well controlled on current dose of synthroid- 100 mcg M- F, 50 mcg Saturday and Sunday but she is not "sure if this is right dose."    Current Outpatient Prescriptions on File Prior to Visit  Medication Sig Dispense Refill  . pravastatin (PRAVACHOL) 10 MG tablet 1 tab by mouth every other day 30 tablet 3   No current facility-administered medications on file prior to visit.    Allergies  Allergen Reactions  . Azithromycin Other (See Comments)    GI upset  . Erythromycin Other (See  Comments)    GI intolerance  . Nitrofurantoin Other (See Comments)    unknown  . Simvastatin     myaglias  . Penicillins Rash  . Sulfonamide Derivatives Rash  . Yellow Dyes (Non-Tartrazine) Rash    Past Medical History  Diagnosis Date  . Rosacea   . Allergic rhinitis due to pollen   . Family history of diabetes mellitus   . GERD (gastroesophageal reflux disease)   . Hypothyroidism   . Pure hypercholesterolemia   . Screening for lipoid disorders   . Basal cell carcinoma     history of    Past Surgical History  Procedure Laterality Date  . Breast enhancement surgery  1997    Family History  Problem Relation Age of Onset  . Hypertension Father   . Prostate cancer Father   . Heart disease Brother     ? heart issue  . Heart attack Paternal Grandfather     late age    History   Social History  . Marital Status: Married    Spouse Name: N/A  . Number of Children: 2  . Years of Education: N/A   Occupational History  .      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 Smoking status: Never Smoker   . Smokeless tobacco: Never Used  . Alcohol Use: No  . Drug Use: No  .  Sexual Activity: Not on file   Other Topics Concern  . Not on file   Social History Narrative   Regular exercise- yes, running 2 to 3 times a week.   Diet- fruits and veggies.   The PMH, PSH, Social History, Family History, Medications, and allergies have been reviewed in [facility], and have been updated if relevant.   Review of Systems  Constitutional: Negative.   HENT: Negative.   Eyes: Negative.   Respiratory: Positive for shortness of breath.   Cardiovascular: Positive for palpitations.  Gastrointestinal: Negative.   Endocrine: Negative.   Genitourinary: Negative.   Musculoskeletal: Negative.   Skin: Negative.   Allergic/Immunologic: Negative.   Neurological: Negative.   Hematological: Negative.   Psychiatric/Behavioral: Negative.   All other systems  reviewed and are negative.     Objective:    BP 110/70 mmHg  Pulse 70  Temp(Src) 98.1 F (36.7 C) (Oral)  Wt 137 lb 8 oz (62.37 kg)  SpO2 99%   Physical Exam  Constitutional: She is oriented to person, place, and time. She appears well-developed and well-nourished. No distress.  HENT:  Head: Normocephalic.  Eyes: Conjunctivae are normal.  Cardiovascular: Normal rate, regular rhythm and normal heart sounds.   Pulmonary/Chest: Effort normal and breath sounds normal. No respiratory distress.  Neurological: She is alert and oriented to person, place, and time.  Skin: Skin is warm and dry.  Psychiatric: She has a normal mood and affect. Her behavior is normal. Judgment and thought content normal.  Nursing note and vitals reviewed.         Assessment & Plan:   Pure hypercholesterolemia  Hypothyroidism, unspecified hypothyroidism type No Follow-up on file.

## 2015-05-06 NOTE — Progress Notes (Signed)
Pre visit review using our clinic review tool, if applicable. No additional management support is needed unless otherwise documented below in the visit note. 

## 2015-05-06 NOTE — Assessment & Plan Note (Signed)
Deteriorated. Discussed importance of decreasing her LDL. She does not want to try another rx for now- plans on taking pravachol and rechecking lipid panel in 8 weeks.

## 2015-05-06 NOTE — Assessment & Plan Note (Signed)
Well controlled on current rx. >25 minutes spent in face to face time with patient, >50% spent in counselling or coordination of care concerning hypothyroidism, HLD and palpitations. We discussed possibly decreasing dose of synthroid to 100 mcg Mon- Thursday, 50 mcg Fri- Sunday.  She wants to hold off on this for now and I agreed since her labs are within normal limits. Follow up thyroid function in 8 weeks when we recheck her cholesterol. The patient indicates understanding of these issues and agrees with the plan.

## 2015-05-06 NOTE — Assessment & Plan Note (Signed)
Still having intermittent symptoms. Refer back to cardiology- she does agree to keep appt this time.

## 2015-05-27 ENCOUNTER — Encounter: Payer: Self-pay | Admitting: Family Medicine

## 2015-05-29 ENCOUNTER — Ambulatory Visit (INDEPENDENT_AMBULATORY_CARE_PROVIDER_SITE_OTHER): Payer: Federal, State, Local not specified - PPO | Admitting: Family Medicine

## 2015-05-29 ENCOUNTER — Encounter: Payer: Self-pay | Admitting: Family Medicine

## 2015-05-29 VITALS — BP 128/80 | HR 76 | Temp 97.7°F | Wt 136.0 lb

## 2015-05-29 DIAGNOSIS — F4322 Adjustment disorder with anxiety: Secondary | ICD-10-CM | POA: Diagnosis not present

## 2015-05-29 MED ORDER — ALPRAZOLAM 0.25 MG PO TABS: 0.2500 mg | ORAL_TABLET | Freq: Every evening | ORAL | Status: DC | PRN

## 2015-05-29 NOTE — Progress Notes (Signed)
Subjective:   Patient ID: Nicole Henry, female    DOB: 14-Sep-1961, 54 y.o.   MRN: [id number]  Nicole Henry is a pleasant 54 y.o. year old female who presents to clinic today with Medication Refill  on 05/29/2015  HPI:  ? Anxiety- under more stress at work.  Year ago, 2011, was prescribed as needed xanax.  She thinks she needs this rx again.  Having increased "moments of anxiety," but she does not feel they are true panic attacks.  SSRIs and Busprar made her feel like "she was in a fog" in the past.  She denies feeling depressed.  No SI or HI.  Not sleeping as well.  Current Outpatient Prescriptions on File Prior to Visit  Medication Sig Dispense Refill  . pravastatin (PRAVACHOL) 10 MG tablet 1 tab by mouth every other day 30 tablet 3  . SYNTHROID 50 MCG tablet take 2 tablets by mouth once daily MONDAY THROUGH FRIDAY AND 1 TABLET ON SATURDAY AND SUNDAY 48 tablet 11   No current facility-administered medications on file prior to visit.    Allergies  Allergen Reactions  . Azithromycin Other (See Comments)    GI upset  . Erythromycin Other (See Comments)    GI intolerance  . Nitrofurantoin Other (See Comments)    unknown  . Simvastatin     myaglias  . Penicillins Rash  . Sulfonamide Derivatives Rash  . Yellow Dyes (Non-Tartrazine) Rash    Past Medical History  Diagnosis Date  . Rosacea   . Allergic rhinitis due to pollen   . Family history of diabetes mellitus   . GERD (gastroesophageal reflux disease)   . Hypothyroidism   . Pure hypercholesterolemia   . Screening for lipoid disorders   . Basal cell carcinoma     history of    Past Surgical History  Procedure Laterality Date  . Breast enhancement surgery  1997    Family History  Problem Relation Age of Onset  . Hypertension Father   . Prostate cancer Father   . Heart disease Brother     ? heart issue  . Heart attack Paternal Grandfather     late age    History   Social History  . Marital Status:  Married    Spouse Name: N/A  . Number of Children: 2  . Years of Education: N/A   Occupational History  .      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 Smoking status: Never Smoker   . Smokeless tobacco: Never Used  . Alcohol Use: No  . Drug Use: No  . Sexual Activity: Not on file   Other Topics Concern  . Not on file   Social History Narrative   Regular exercise- yes, running 2 to 3 times a week.   Diet- fruits and veggies.   The PMH, PSH, Social History, Family History, Medications, and allergies have been reviewed in [facility], and have been updated if relevant.   Review of Systems  Constitutional: Negative.   Gastrointestinal: Negative.   Psychiatric/Behavioral: Positive for sleep disturbance. Negative for suicidal ideas, hallucinations, behavioral problems, confusion, self-injury, dysphoric mood, decreased concentration and agitation. The patient is nervous/anxious. The patient is not hyperactive.   All other systems reviewed and are negative.      Objective:    BP 128/80 mmHg  Pulse 76  Temp(Src) 97.7 F (36.5 C) (Tympanic)  Wt 136 lb (61.689 kg)  SpO2 96%  Physical Exam  Constitutional: She is oriented to person, place, and time. She appears well-developed and well-nourished. No distress.  HENT:  Head: Normocephalic and atraumatic.  Eyes: Conjunctivae are normal.  Cardiovascular: Normal rate.   Pulmonary/Chest: Effort normal.  Musculoskeletal: She exhibits no edema.  Neurological: She is alert and oriented to person, place, and time. No cranial nerve deficit.  Skin: Skin is warm and dry.  Psychiatric: She has a normal mood and affect. Her behavior is normal. Judgment and thought content normal.  Nursing note and vitals reviewed.         Assessment & Plan:   No diagnosis found. No Follow-up on file.

## 2015-05-29 NOTE — Assessment & Plan Note (Signed)
Deteriorated. >25 minutes spent in face to face time with patient, >50% spent in counselling or coordination of care She does not want to try another SSRI and feels she would use the xanax no more than a couple of times a month.  Rx printed for Xanax 0.25 mg and given to pt.  Advised to use very sparingly and to keep me updated. Psychotherapy deferred. The patient indicates understanding of these issues and agrees with the plan.

## 2015-05-29 NOTE — Progress Notes (Signed)
Pre visit review using our clinic review tool, if applicable. No additional management support is needed unless otherwise documented below in the visit note. 

## 2015-05-29 NOTE — Patient Instructions (Signed)
Great to see you. Hang in there and keep me updated.

## 2015-06-10 ENCOUNTER — Encounter: Payer: Self-pay | Admitting: *Deleted

## 2015-06-21 ENCOUNTER — Ambulatory Visit (INDEPENDENT_AMBULATORY_CARE_PROVIDER_SITE_OTHER): Payer: Federal, State, Local not specified - PPO | Admitting: Cardiovascular Disease

## 2015-06-21 ENCOUNTER — Encounter: Payer: Self-pay | Admitting: Cardiovascular Disease

## 2015-06-21 VITALS — BP 140/80 | HR 83 | Ht 63.0 in | Wt 135.0 lb

## 2015-06-21 DIAGNOSIS — R0602 Shortness of breath: Secondary | ICD-10-CM | POA: Diagnosis not present

## 2015-06-21 DIAGNOSIS — F4322 Adjustment disorder with anxiety: Secondary | ICD-10-CM | POA: Diagnosis not present

## 2015-06-21 DIAGNOSIS — R55 Syncope and collapse: Secondary | ICD-10-CM | POA: Diagnosis not present

## 2015-06-21 DIAGNOSIS — E7800 Pure hypercholesterolemia, unspecified: Secondary | ICD-10-CM

## 2015-06-21 NOTE — Assessment & Plan Note (Signed)
Significant lightheadedness, near syncope on her climbs up [facility] and stairs. No underlying lung disease to suggest hypoxia. Stress test ordered as above. Possibly from deconditioning

## 2015-06-21 NOTE — Assessment & Plan Note (Signed)
Etiology of her symptoms when climbing hills and stairs (on her hikes) is unclear. Clinical exam benign, EKG normal. We will schedule her for stress echocardiogram to rule out structural heart disease and ischemia. If these are normal, recommended we monitor her oxygen saturations with exertion. Unable to exclude conditioning.

## 2015-06-21 NOTE — Progress Notes (Signed)
Patient ID: Nicole Henry, female    DOB: [birth year], 54 y.o.   MRN: [id number]  HPI Comments: Ms. Sulema Braid is a pleasant 54 year old woman with history of hyperlipidemia, hypothyroidism, anxiety, who presents by referral from Dr. Deborra Medina for symptoms of lightheadedness, shortness of breath when climbing inclines.  She reports that since her kids have left the house as they have grownup, they have started doing more activities. They have started going on hikes 5-13 miles at a time. Typically on a flat surface she does well but on several occasions when she climbs hills or stairs, she has significant lightheadedness, shortness of breath, dizziness/lightheadedness and has to sit down to recover. She feels her symptoms are worse than they should be as sometimes the hill incline is not particularly severe.   She is otherwise active, denies any significant chest pain with exertion.  With exertion heart rate is typically normal range. She does not report any episodes with tachycardia. She is concerned about her cardiac issues as brother has history of heart failure/CHF. Parents are alive in their 31s, no premature coronary artery disease.  New issue in the past month or so is brown fingernails on her third fourth fifth digits bilaterally.  EKG on today's visit shows normal sinus rhythm with rate 83 bpm no significant ST or T-wave changes     Allergies  Allergen Reactions  . Azithromycin Other (See Comments)    GI upset  . Erythromycin Other (See Comments)    GI intolerance  . Nitrofurantoin Other (See Comments)    unknown  . Simvastatin     myaglias  . Penicillins Rash  . Sulfonamide Derivatives Rash  . Yellow Dyes (Non-Tartrazine) Rash    Current Outpatient Prescriptions on File Prior to Visit  Medication Sig Dispense Refill  . ALPRAZolam (XANAX) 0.25 MG tablet Take 1 tablet (0.25 mg total) by mouth at bedtime as needed for anxiety. 30 tablet 0  . pravastatin (PRAVACHOL) 10 MG  tablet 1 tab by mouth every other day 30 tablet 3  . SYNTHROID 50 MCG tablet take 2 tablets by mouth once daily MONDAY THROUGH FRIDAY AND 1 TABLET ON SATURDAY AND SUNDAY 48 tablet 11   No current facility-administered medications on file prior to visit.    Past Medical History  Diagnosis Date  . Rosacea   . Allergic rhinitis due to pollen   . Family history of diabetes mellitus   . GERD (gastroesophageal reflux disease)   . Hypothyroidism   . Pure hypercholesterolemia   . Screening for lipoid disorders   . Basal cell carcinoma     history of    Past Surgical History  Procedure Laterality Date  . Breast enhancement surgery  1997    Social History  reports that she has never smoked. She has never used smokeless tobacco. She reports that she does not drink alcohol or use illicit drugs.  Family History family history includes Heart attack in her paternal grandfather; Heart disease in her brother; Hypertension in her father; Prostate cancer in her father.   Review of Systems  Constitutional: Negative.   Respiratory: Positive for shortness of breath.   Cardiovascular: Negative.   Gastrointestinal: Negative.   Endocrine: Negative.   Musculoskeletal: Negative.   Neurological: Positive for dizziness and light-headedness.  Hematological: Negative.   Psychiatric/Behavioral: Negative.   All other systems reviewed and are negative.   BP 140/80 mmHg  Pulse 83  Ht 5\' 3"  (1.6 m)  Wt 135 lb (61.236 kg)  BMI 23.92 kg/m2  Physical Exam  Constitutional: She is oriented to person, place, and time. She appears well-developed and well-nourished.  HENT:  Head: Normocephalic.  Nose: Nose normal.  Mouth/Throat: Oropharynx is clear and moist.  Eyes: Conjunctivae are normal. Pupils are equal, round, and reactive to light.  Neck: Normal range of motion. Neck supple. No JVD present.  Cardiovascular: Normal rate, regular rhythm, normal heart sounds and intact distal pulses.  Exam reveals  no gallop and no friction rub.   No murmur heard. Pulmonary/Chest: Effort normal and breath sounds normal. No respiratory distress. She has no wheezes. She has no rales. She exhibits no tenderness.  Abdominal: Soft. Bowel sounds are normal. She exhibits no distension. There is no tenderness.  Musculoskeletal: Normal range of motion. She exhibits no edema or tenderness.  Lymphadenopathy:    She has no cervical adenopathy.  Neurological: She is alert and oriented to person, place, and time. Coordination normal.  Skin: Skin is warm and dry. No rash noted. No erythema.  Psychiatric: She has a normal mood and affect. Her behavior is normal. Judgment and thought content normal.

## 2015-06-21 NOTE — Addendum Note (Signed)
Addended by: Minna Merritts on: 06/21/2015 01:49 PM   Modules accepted: Level of Service

## 2015-06-21 NOTE — Assessment & Plan Note (Signed)
She does report having prior issues with anxiety. Seems stable at this time

## 2015-06-21 NOTE — Assessment & Plan Note (Signed)
Recommended that she stay on her pravastatin Prior cholesterol greater than 250

## 2015-06-21 NOTE — Patient Instructions (Addendum)
No medication changes were made.   We will schedule you for a stress echo (treadmill stress echo)   Please call us if you have new issues that need to be addressed before your next appt.    Exercise Stress Echocardiogram An exercise stress echocardiogram is a heart (cardiac) test used to check the function of your heart. This test may also be called an exercise stress echocardiography or stress echo. This stress test will check how well your heart muscle and valves are working and determine if your heart muscle is getting enough blood. You will exercise on a treadmill to naturally increase or stress the functioning of your heart.  An echocardiogram uses sound waves (ultrasound) to produce an image of your heart. If your heart does not work normally, it may indicate coronary artery disease with poor coronary blood supply. The coronary arteries are the arteries that bring blood and oxygen to your heart. LET [facility] CARE PROVIDER KNOW ABOUT:  Any allergies you have.  All medicines you are taking, including vitamins, herbs, eye drops, creams, and over-the-counter medicines.  Previous problems you or members of your family have had with the use of anesthetics.  Any blood disorders you have.  Previous surgeries you have had.  Medical conditions you have.  Possibility of pregnancy, if this applies. RISKS AND COMPLICATIONS Generally, this is a safe procedure. However, as with any procedure, complications can occur. Possible complications can include:  You develop pain or pressure in the following areas:  Chest.  Jaw or neck.  Between your shoulder blades.  Radiating down your left arm.  Dizziness or lightheadedness.  Shortness of breath.  Increased or irregular heartbeat.  Nausea or vomiting.  Heart attack (rare). BEFORE THE PROCEDURE  Avoid all forms of caffeine for 24 hours before your test or as directed by your health care provider. This includes coffee, tea (even  decaffeinated tea), caffeinated sodas, chocolate, cocoa, and certain pain medicines.  Follow your health care provider's instructions regarding eating and drinking before the test.  Take your medicines as directed at regular times with water unless instructed otherwise. Exceptions may include:  If you have diabetes, ask how you are to take your insulin or pills. It is common to adjust insulin dosing the morning of the test.  If you are taking beta-blocker medicines, it is important to talk to your health care provider about these medicines well before the date of your test. Taking beta-blocker medicines may interfere with the test. In some cases, these medicines need to be changed or stopped 24 hours or more before the test.  If you wear a nitroglycerin patch, it may need to be removed prior to the test. Ask your health care provider if the patch should be removed before the test.  If you use an inhaler for any breathing condition, bring it with you to the test.  If you are an outpatient, bring a snack so you can eat right after the stress phase of the test.  Do not smoke for 4 hours prior to the test or as directed by your health care provider.  Wear loose-fitting clothes and comfortable shoes for the test. This test involves walking on a treadmill. PROCEDURE   Multiple electrodes will be put on your chest. If needed, small areas of your chest may be shaved to get better contact with the electrodes. Once the electrodes are attached to your body, multiple wires will be attached to the electrodes, and your heart rate will be  monitored.  You will have an echocardiogram done at rest.  To produce this image of your heart, gel is applied to your chest, and a wand-like tool (transducer) is moved over the chest. The transducer sends the sound waves through the chest to create the moving images of your heart.  You may need an IV to receive a medication that improves the quality of the  pictures.  You will then walk on a treadmill. The treadmill will be started at a slow pace. The treadmill speed and incline will gradually be increased to raise your heart rate.  At the peak of exercise, the treadmill will be stopped. You will lie down immediately on a bed so that a second echocardiogram can be done to visualize your heart's motion with exercise.  The test usually takes 30-60 minutes to complete. AFTER THE PROCEDURE  Your heart rate and blood pressure will be monitored after the test.  You may return to your normal schedule, including diet, activities, and medicines, unless your health care provider tells you otherwise. Document Released: 12/04/2004 Document Revised: 12/05/2013 Document Reviewed: 08/07/2013 [facility] Patient Information 2015 [place], [place]. This information is not intended to replace advice given to you by your health care provider. Make sure you discuss any questions you have with your health care provider.

## 2015-06-27 ENCOUNTER — Encounter: Payer: Self-pay | Admitting: Family Medicine

## 2015-07-04 ENCOUNTER — Encounter: Payer: Self-pay | Admitting: *Deleted

## 2015-07-04 ENCOUNTER — Other Ambulatory Visit (INDEPENDENT_AMBULATORY_CARE_PROVIDER_SITE_OTHER): Payer: Federal, State, Local not specified - PPO

## 2015-07-04 ENCOUNTER — Encounter: Payer: Self-pay | Admitting: Family Medicine

## 2015-07-04 DIAGNOSIS — E039 Hypothyroidism, unspecified: Secondary | ICD-10-CM

## 2015-07-04 DIAGNOSIS — E7800 Pure hypercholesterolemia, unspecified: Secondary | ICD-10-CM

## 2015-07-04 LAB — COMPREHENSIVE METABOLIC PANEL
ALT: 10 U/L (ref 0–35)
AST: 16 U/L (ref 0–37)
Albumin: 4.3 g/dL (ref 3.5–5.2)
Alkaline Phosphatase: 84 U/L (ref 39–117)
BILIRUBIN TOTAL: 0.4 mg/dL (ref 0.2–1.2)
BUN: 9 mg/dL (ref 6–23)
CHLORIDE: 103 meq/L (ref 96–112)
CO2: 31 mEq/L (ref 19–32)
CREATININE: 0.82 mg/dL (ref 0.40–1.20)
Calcium: 9.6 mg/dL (ref 8.4–10.5)
GFR: 77.31 mL/min (ref >60.00)
GLUCOSE: 77 mg/dL (ref 70–99)
Potassium: 3.8 mEq/L (ref 3.5–5.1)
Sodium: 141 mEq/L (ref 135–145)
TOTAL PROTEIN: 7.3 g/dL (ref 6.0–8.3)

## 2015-07-04 LAB — LIPID PANEL
Cholesterol: 212 mg/dL — ABNORMAL HIGH (ref 0–200)
HDL: 64.1 mg/dL (ref >39.00)
LDL CALC: 121 mg/dL — AB (ref 0–99)
NONHDL: 147.9
Total CHOL/HDL Ratio: 3
Triglycerides: 135 mg/dL (ref 0.0–149.0)
VLDL: 27 mg/dL (ref 0.0–40.0)

## 2015-07-04 LAB — TSH: TSH: 6.56 u[IU]/mL — ABNORMAL HIGH (ref 0.35–4.50)

## 2015-07-04 LAB — T4, FREE: FREE T4: 0.84 ng/dL (ref 0.60–1.60)

## 2015-07-11 ENCOUNTER — Ambulatory Visit (INDEPENDENT_AMBULATORY_CARE_PROVIDER_SITE_OTHER): Payer: Federal, State, Local not specified - PPO

## 2015-07-11 DIAGNOSIS — R0602 Shortness of breath: Secondary | ICD-10-CM

## 2015-07-30 ENCOUNTER — Other Ambulatory Visit: Payer: Self-pay | Admitting: Family Medicine

## 2015-09-20 ENCOUNTER — Ambulatory Visit (INDEPENDENT_AMBULATORY_CARE_PROVIDER_SITE_OTHER): Payer: Federal, State, Local not specified - PPO

## 2015-09-20 DIAGNOSIS — Z23 Encounter for immunization: Secondary | ICD-10-CM | POA: Diagnosis not present

## 2016-05-22 ENCOUNTER — Other Ambulatory Visit: Payer: Self-pay | Admitting: Family Medicine

## 2016-05-30 ENCOUNTER — Other Ambulatory Visit: Payer: Self-pay | Admitting: Obstetrics and Gynecology

## 2016-05-30 DIAGNOSIS — Z13820 Encounter for screening for osteoporosis: Secondary | ICD-10-CM

## 2016-06-02 ENCOUNTER — Other Ambulatory Visit: Payer: Self-pay | Admitting: Obstetrics and Gynecology

## 2016-06-02 DIAGNOSIS — Z780 Asymptomatic menopausal state: Secondary | ICD-10-CM

## 2016-06-22 ENCOUNTER — Other Ambulatory Visit: Payer: Self-pay | Admitting: Family Medicine

## 2016-06-29 ENCOUNTER — Ambulatory Visit
Admission: RE | Admit: 2016-06-29 | Discharge: 2016-06-29 | Disposition: A | Discharge status: Home / Self Care | Payer: Federal, State, Local not specified - PPO | Source: Ambulatory Visit | Attending: Obstetrics and Gynecology | Admitting: Obstetrics and Gynecology

## 2016-06-29 DIAGNOSIS — Z780 Asymptomatic menopausal state: Secondary | ICD-10-CM

## 2016-07-03 ENCOUNTER — Encounter: Payer: Self-pay | Admitting: Family Medicine

## 2016-07-21 ENCOUNTER — Other Ambulatory Visit: Payer: Self-pay | Admitting: Family Medicine

## 2016-07-21 ENCOUNTER — Other Ambulatory Visit (INDEPENDENT_AMBULATORY_CARE_PROVIDER_SITE_OTHER): Payer: Federal, State, Local not specified - PPO

## 2016-07-21 DIAGNOSIS — Z01419 Encounter for gynecological examination (general) (routine) without abnormal findings: Secondary | ICD-10-CM

## 2016-07-21 DIAGNOSIS — E039 Hypothyroidism, unspecified: Secondary | ICD-10-CM

## 2016-07-21 DIAGNOSIS — E7800 Pure hypercholesterolemia, unspecified: Secondary | ICD-10-CM

## 2016-07-21 DIAGNOSIS — Z0000 Encounter for general adult medical examination without abnormal findings: Secondary | ICD-10-CM | POA: Diagnosis not present

## 2016-07-21 LAB — COMPREHENSIVE METABOLIC PANEL
ALT: 8 U/L (ref 0–35)
AST: 14 U/L (ref 0–37)
Albumin: 4.1 g/dL (ref 3.5–5.2)
Alkaline Phosphatase: 83 U/L (ref 39–117)
BUN: 11 mg/dL (ref 6–23)
CHLORIDE: 104 meq/L (ref 96–112)
CO2: 29 meq/L (ref 19–32)
Calcium: 9.5 mg/dL (ref 8.4–10.5)
Creatinine, Ser: 0.84 mg/dL (ref 0.40–1.20)
GFR: 74.9 mL/min (ref >60.00)
GLUCOSE: 92 mg/dL (ref 70–99)
POTASSIUM: 4.2 meq/L (ref 3.5–5.1)
Sodium: 140 mEq/L (ref 135–145)
Total Bilirubin: 0.5 mg/dL (ref 0.2–1.2)
Total Protein: 7.2 g/dL (ref 6.0–8.3)

## 2016-07-21 LAB — CBC WITH DIFFERENTIAL/PLATELET
BASOS PCT: 0.5 % (ref 0.0–3.0)
Basophils Absolute: 0 10*3/uL (ref 0.0–0.1)
EOS ABS: 0.1 10*3/uL (ref 0.0–0.7)
EOS PCT: 2.4 % (ref 0.0–5.0)
HCT: 37.7 % (ref 36.0–46.0)
Hemoglobin: 12.5 g/dL (ref 12.0–15.0)
Lymphocytes Relative: 43.1 % (ref 12.0–46.0)
Lymphs Abs: 1.7 10*3/uL (ref 0.7–4.0)
MCHC: 33.1 g/dL (ref 30.0–36.0)
MCV: 86.2 fl (ref 78.0–100.0)
MONO ABS: 0.4 10*3/uL (ref 0.1–1.0)
Monocytes Relative: 11 % (ref 3.0–12.0)
NEUTROS ABS: 1.7 10*3/uL (ref 1.4–7.7)
NEUTROS PCT: 43 % (ref 43.0–77.0)
Platelets: 275 10*3/uL (ref 150.0–400.0)
RBC: 4.38 Mil/uL (ref 3.87–5.11)
RDW: 14.2 % (ref 11.5–15.5)
WBC: 3.9 10*3/uL — ABNORMAL LOW (ref 4.0–10.5)

## 2016-07-21 LAB — T4, FREE: FREE T4: 1 ng/dL (ref 0.60–1.60)

## 2016-07-21 LAB — TSH: TSH: 0.55 u[IU]/mL (ref 0.35–4.50)

## 2016-07-21 LAB — LIPID PANEL
CHOLESTEROL: 225 mg/dL — AB (ref 0–200)
HDL: 52.3 mg/dL (ref >39.00)
LDL CALC: 148 mg/dL — AB (ref 0–99)
NonHDL: 172.87
TRIGLYCERIDES: 126 mg/dL (ref 0.0–149.0)
Total CHOL/HDL Ratio: 4
VLDL: 25.2 mg/dL (ref 0.0–40.0)

## 2016-07-27 ENCOUNTER — Ambulatory Visit (INDEPENDENT_AMBULATORY_CARE_PROVIDER_SITE_OTHER): Payer: Federal, State, Local not specified - PPO | Admitting: Family Medicine

## 2016-07-27 ENCOUNTER — Encounter: Payer: Self-pay | Admitting: Family Medicine

## 2016-07-27 VITALS — BP 130/64 | HR 76 | Temp 98.0°F | Ht 62.5 in | Wt 138.8 lb

## 2016-07-27 DIAGNOSIS — M810 Age-related osteoporosis without current pathological fracture: Secondary | ICD-10-CM | POA: Diagnosis not present

## 2016-07-27 DIAGNOSIS — E039 Hypothyroidism, unspecified: Secondary | ICD-10-CM | POA: Diagnosis not present

## 2016-07-27 DIAGNOSIS — E7800 Pure hypercholesterolemia, unspecified: Secondary | ICD-10-CM | POA: Diagnosis not present

## 2016-07-27 DIAGNOSIS — Z01419 Encounter for gynecological examination (general) (routine) without abnormal findings: Secondary | ICD-10-CM

## 2016-07-27 DIAGNOSIS — Z0000 Encounter for general adult medical examination without abnormal findings: Secondary | ICD-10-CM

## 2016-07-27 MED ORDER — ALENDRONATE SODIUM 70 MG PO TABS: 70.0000 mg | ORAL_TABLET | ORAL | 11 refills | Status: DC

## 2016-07-27 MED ORDER — SYNTHROID 50 MCG PO TABS: ORAL_TABLET | ORAL | 6 refills | Status: DC

## 2016-07-27 NOTE — Progress Notes (Signed)
Pre visit review using our clinic review tool, if applicable. No additional management support is needed unless otherwise documented below in the visit note. 

## 2016-07-27 NOTE — Progress Notes (Signed)
Subjective:   Patient ID: Nicole Henry, female    DOB: 1961-06-03, 55 y.o.   MRN: [id number]  Nicole Henry is a pleasant 55 y.o. year old female who presents to clinic today with Annual Exam and Osteoporosis  and follow up of chronic medical conditions on 07/27/2016  HPI:  GYN is Dr. Paula Compton. DEXA 06/29/16- osteoporosis.  GYN advised that she follow up with me for this.  Colonoscopy 06/17/12  Hypothyroidism- has been taking 100 mcg daily M- F and 50 mcg on Saturday and Sunday.  Denies any symptoms of hypo or hyperthyroidism.  Lab Results  Component Value Date   TSH 0.55 07/21/2016   HLD- taking pravachol 10 mg daily.  LDL has increased this month.  Lab Results  Component Value Date   CHOL 225 (H) 07/21/2016   HDL 52.30 07/21/2016   LDLCALC 148 (H) 07/21/2016   LDLDIRECT 151.4 05/12/2013   TRIG 126.0 07/21/2016   CHOLHDL 4 07/21/2016   Lab Results  Component Value Date   CREATININE 0.84 07/21/2016   Lab Results  Component Value Date   NA 140 07/21/2016   K 4.2 07/21/2016   CL 104 07/21/2016   CO2 29 07/21/2016   Lab Results  Component Value Date   WBC 3.9 (L) 07/21/2016   HGB 12.5 07/21/2016   HCT 37.7 07/21/2016   MCV 86.2 07/21/2016   PLT 275.0 07/21/2016   Lab Results  Component Value Date   TSH 0.55 07/21/2016   Current Outpatient Prescriptions on File Prior to Visit  Medication Sig Dispense Refill  . ALPRAZolam (XANAX) 0.25 MG tablet Take 1 tablet (0.25 mg total) by mouth at bedtime as needed for anxiety. 30 tablet 0  . SYNTHROID 50 MCG tablet take 2 tablets by mouth once daily MONDAY THROUGH FRIDAY AND 1 TABLET SATURDAY AND SUNDAY 48 tablet 0  . pravastatin (PRAVACHOL) 20 MG tablet take 1 tablet by mouth once daily (Patient not taking: Reported on 07/27/2016) 90 tablet 1   No current facility-administered medications on file prior to visit.     Allergies  Allergen Reactions  . Azithromycin Other (See Comments)    GI upset  .  Erythromycin Other (See Comments)    GI intolerance  . Nitrofurantoin Other (See Comments)    unknown  . Simvastatin     myaglias  . Penicillins Rash  . Sulfonamide Derivatives Rash  . Yellow Dyes (Non-Tartrazine) Rash    Past Medical History:  Diagnosis Date  . Allergic rhinitis due to pollen   . Basal cell carcinoma    history of  . Family history of diabetes mellitus   . GERD (gastroesophageal reflux disease)   . Hypothyroidism   . Pure hypercholesterolemia   . Rosacea   . Screening for lipoid disorders     Past Surgical History:  Procedure Laterality Date  . BREAST ENHANCEMENT SURGERY  1997    Family History  Problem Relation Age of Onset  . Hypertension Father   . Prostate cancer Father   . Heart disease Brother     ? heart issue  . Heart attack Paternal Grandfather     late age    Social History   Social History  . Marital status: Married    Spouse name: N/A  . Number of children: 2  . Years of education: N/A   Occupational History  .      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  Smoking status: Never Smoker  . Smokeless tobacco: Never Used  . Alcohol use No  . Drug use: No  . Sexual activity: Not on file   Other Topics Concern  . Not on file   Social History Narrative   Regular exercise- yes, running 2 to 3 times a week.   Diet- fruits and veggies.   The PMH, PSH, Social History, Family History, Medications, and allergies have been reviewed in [facility], and have been updated if relevant.  Review of Systems     Objective:    BP 130/64   Pulse 76   Temp 98 F (36.7 C) (Oral)   Ht 5' 2.5" (1.588 m)   Wt 138 lb 12 oz (62.9 kg)   SpO2 98%   BMI 24.97 kg/m    Physical Exam  Constitutional: She is oriented to person, place, and time. She appears well-developed and well-nourished. No distress.  HENT:  Head: Normocephalic.  Cardiovascular: Normal rate and regular rhythm.   Pulmonary/Chest: Effort normal  and breath sounds normal.  Musculoskeletal: Normal range of motion.  Neurological: She is alert and oriented to person, place, and time. No cranial nerve deficit.  Skin: Skin is warm and dry. She is not diaphoretic.  Psychiatric: She has a normal mood and affect. Her behavior is normal. Judgment normal.  Nursing note and vitals reviewed.         Assessment & Plan:   Hypothyroidism, unspecified hypothyroidism type  Well woman exam  Pure hypercholesterolemia  Osteoporosis No Follow-up on file.

## 2016-07-27 NOTE — Assessment & Plan Note (Signed)
Reviewed preventive care protocols, scheduled due services, and updated immunizations Discussed nutrition, exercise, diet, and healthy lifestyle.  

## 2016-07-27 NOTE — Assessment & Plan Note (Signed)
Well controlled.  No changes made. 

## 2016-07-27 NOTE — Patient Instructions (Signed)
Great to see you.  Take fosamax as directed.  Start Caltrate- 2 tabs daily.  Try to get most or all of your calcium from your food--aim for 1000 mg/day for women up to 36 and men up to 70 and 1200 mg/day for women over 4 and men over 70.  To figure out dietary calcium: 300 mg/day from all non dairy foods plus 300 mg per cup of milk, other dairy, or fortified juice.  Non dairy foods that contain calcium:  Kale, oranges, sardines, oatmeal, soy milk/soybeans, salmon, white beans, dried figs, turnip greens, almonds, broccoli, tofu.

## 2016-07-27 NOTE — Assessment & Plan Note (Signed)
New diagnosis- eRx sent for fosamax and discussed with pt. Advised starting Caltrate as well.

## 2016-10-02 ENCOUNTER — Ambulatory Visit (INDEPENDENT_AMBULATORY_CARE_PROVIDER_SITE_OTHER): Payer: Federal, State, Local not specified - PPO

## 2016-10-02 DIAGNOSIS — Z23 Encounter for immunization: Secondary | ICD-10-CM

## 2017-01-11 DIAGNOSIS — K080 Exfoliation of teeth due to systemic causes: Secondary | ICD-10-CM | POA: Diagnosis not present

## 2017-02-22 DIAGNOSIS — K080 Exfoliation of teeth due to systemic causes: Secondary | ICD-10-CM | POA: Diagnosis not present

## 2017-02-25 DIAGNOSIS — D2262 Melanocytic nevi of left upper limb, including shoulder: Secondary | ICD-10-CM | POA: Diagnosis not present

## 2017-02-25 DIAGNOSIS — L821 Other seborrheic keratosis: Secondary | ICD-10-CM | POA: Diagnosis not present

## 2017-02-25 DIAGNOSIS — L570 Actinic keratosis: Secondary | ICD-10-CM | POA: Diagnosis not present

## 2017-02-25 DIAGNOSIS — L820 Inflamed seborrheic keratosis: Secondary | ICD-10-CM | POA: Diagnosis not present

## 2017-02-25 DIAGNOSIS — D225 Melanocytic nevi of trunk: Secondary | ICD-10-CM | POA: Diagnosis not present

## 2017-02-25 DIAGNOSIS — Z85828 Personal history of other malignant neoplasm of skin: Secondary | ICD-10-CM | POA: Diagnosis not present

## 2017-03-30 DIAGNOSIS — M79652 Pain in left thigh: Secondary | ICD-10-CM | POA: Diagnosis not present

## 2017-04-10 ENCOUNTER — Other Ambulatory Visit: Payer: Self-pay | Admitting: Family Medicine

## 2017-04-12 DIAGNOSIS — I8312 Varicose veins of left lower extremity with inflammation: Secondary | ICD-10-CM | POA: Diagnosis not present

## 2017-04-13 DIAGNOSIS — M79652 Pain in left thigh: Secondary | ICD-10-CM | POA: Diagnosis not present

## 2017-05-27 DIAGNOSIS — R399 Unspecified symptoms and signs involving the genitourinary system: Secondary | ICD-10-CM | POA: Diagnosis not present

## 2017-06-29 DIAGNOSIS — Z130 Encounter for screening for diseases of the blood and blood-forming organs and certain disorders involving the immune mechanism: Secondary | ICD-10-CM | POA: Diagnosis not present

## 2017-06-29 DIAGNOSIS — Z124 Encounter for screening for malignant neoplasm of cervix: Secondary | ICD-10-CM | POA: Diagnosis not present

## 2017-06-29 DIAGNOSIS — Z1389 Encounter for screening for other disorder: Secondary | ICD-10-CM | POA: Diagnosis not present

## 2017-06-29 DIAGNOSIS — Z1151 Encounter for screening for human papillomavirus (HPV): Secondary | ICD-10-CM | POA: Diagnosis not present

## 2017-06-29 DIAGNOSIS — Z1231 Encounter for screening mammogram for malignant neoplasm of breast: Secondary | ICD-10-CM | POA: Diagnosis not present

## 2017-06-29 DIAGNOSIS — Z01419 Encounter for gynecological examination (general) (routine) without abnormal findings: Secondary | ICD-10-CM | POA: Diagnosis not present

## 2017-06-29 DIAGNOSIS — Z6824 Body mass index (BMI) 24.0-24.9, adult: Secondary | ICD-10-CM | POA: Diagnosis not present

## 2017-07-26 DIAGNOSIS — K080 Exfoliation of teeth due to systemic causes: Secondary | ICD-10-CM | POA: Diagnosis not present

## 2017-08-20 ENCOUNTER — Other Ambulatory Visit: Payer: Self-pay

## 2017-08-20 MED ORDER — SYNTHROID 50 MCG PO TABS: ORAL_TABLET | ORAL | 1 refills | Status: DC

## 2017-08-20 NOTE — Telephone Encounter (Signed)
Pt request refill DAW Synthroid to [address]. Pt has CPX scheduled on 09/15/17. Done and left v/m for pt to ck with pharmacy.

## 2017-08-24 ENCOUNTER — Ambulatory Visit: Payer: Self-pay | Admitting: Family Medicine

## 2017-09-15 ENCOUNTER — Encounter: Payer: Self-pay | Admitting: Family Medicine

## 2017-09-15 ENCOUNTER — Ambulatory Visit (INDEPENDENT_AMBULATORY_CARE_PROVIDER_SITE_OTHER): Payer: Federal, State, Local not specified - PPO | Admitting: Family Medicine

## 2017-09-15 VITALS — BP 130/80 | HR 80 | Ht 63.0 in | Wt 137.0 lb

## 2017-09-15 DIAGNOSIS — E7800 Pure hypercholesterolemia, unspecified: Secondary | ICD-10-CM

## 2017-09-15 DIAGNOSIS — F4322 Adjustment disorder with anxiety: Secondary | ICD-10-CM

## 2017-09-15 DIAGNOSIS — Z01419 Encounter for gynecological examination (general) (routine) without abnormal findings: Secondary | ICD-10-CM | POA: Diagnosis not present

## 2017-09-15 DIAGNOSIS — E039 Hypothyroidism, unspecified: Secondary | ICD-10-CM

## 2017-09-15 DIAGNOSIS — Z23 Encounter for immunization: Secondary | ICD-10-CM | POA: Diagnosis not present

## 2017-09-15 DIAGNOSIS — M810 Age-related osteoporosis without current pathological fracture: Secondary | ICD-10-CM | POA: Diagnosis not present

## 2017-09-15 LAB — CBC WITH DIFFERENTIAL/PLATELET
BASOS PCT: 0.6 % (ref 0.0–3.0)
Basophils Absolute: 0 10*3/uL (ref 0.0–0.1)
EOS PCT: 2.4 % (ref 0.0–5.0)
Eosinophils Absolute: 0.1 10*3/uL (ref 0.0–0.7)
HEMATOCRIT: 41.1 % (ref 36.0–46.0)
Hemoglobin: 13.3 g/dL (ref 12.0–15.0)
LYMPHS ABS: 2.1 10*3/uL (ref 0.7–4.0)
LYMPHS PCT: 47.7 % — AB (ref 12.0–46.0)
MCHC: 32.4 g/dL (ref 30.0–36.0)
MCV: 89 fl (ref 78.0–100.0)
MONOS PCT: 12 % (ref 3.0–12.0)
Monocytes Absolute: 0.5 10*3/uL (ref 0.1–1.0)
NEUTROS ABS: 1.6 10*3/uL (ref 1.4–7.7)
NEUTROS PCT: 37.3 % — AB (ref 43.0–77.0)
PLATELETS: 297 10*3/uL (ref 150.0–400.0)
RBC: 4.61 Mil/uL (ref 3.87–5.11)
RDW: 13.4 % (ref 11.5–15.5)
WBC: 4.4 10*3/uL (ref 4.0–10.5)

## 2017-09-15 LAB — COMPREHENSIVE METABOLIC PANEL
ALT: 10 U/L (ref 0–35)
AST: 17 U/L (ref 0–37)
Albumin: 4.6 g/dL (ref 3.5–5.2)
Alkaline Phosphatase: 78 U/L (ref 39–117)
BUN: 14 mg/dL (ref 6–23)
CALCIUM: 9.8 mg/dL (ref 8.4–10.5)
CHLORIDE: 102 meq/L (ref 96–112)
CO2: 31 meq/L (ref 19–32)
Creatinine, Ser: 0.83 mg/dL (ref 0.40–1.20)
GFR: 75.62 mL/min (ref >60.00)
GLUCOSE: 97 mg/dL (ref 70–99)
POTASSIUM: 4.3 meq/L (ref 3.5–5.1)
Sodium: 139 mEq/L (ref 135–145)
Total Bilirubin: 0.5 mg/dL (ref 0.2–1.2)
Total Protein: 8 g/dL (ref 6.0–8.3)

## 2017-09-15 LAB — TSH: TSH: 5.95 u[IU]/mL — ABNORMAL HIGH (ref 0.35–4.50)

## 2017-09-15 LAB — T4, FREE: Free T4: 0.9 ng/dL (ref 0.60–1.60)

## 2017-09-15 LAB — LIPID PANEL
CHOL/HDL RATIO: 4
Cholesterol: 270 mg/dL — ABNORMAL HIGH (ref 0–200)
HDL: 60.9 mg/dL (ref >39.00)
LDL Cholesterol: 185 mg/dL — ABNORMAL HIGH (ref 0–99)
NONHDL: 208.95
TRIGLYCERIDES: 121 mg/dL (ref 0.0–149.0)
VLDL: 24.2 mg/dL (ref 0.0–40.0)

## 2017-09-15 MED ORDER — SERTRALINE HCL 50 MG PO TABS: 50.0000 mg | ORAL_TABLET | Freq: Every day | ORAL | 3 refills | Status: DC

## 2017-09-15 NOTE — Patient Instructions (Signed)
Start Zoloft 50 mg. Please  take 1/2 tablet daily for 10 days, then advance to 1 full tablet thereafter. possible side effects include headache, GI upset, drowsiness.  Please call to schedule your bone density.

## 2017-09-15 NOTE — Assessment & Plan Note (Signed)
She could not tolerate fosamax and stopped it- she cannot remember the side effect.  She would like to repeat DEXA before considering other tx options. Order placed.

## 2017-09-15 NOTE — Progress Notes (Signed)
Subjective:   Patient ID: Nicole Henry, female    DOB: 1961-12-03, 56 y.o.   MRN: [id number]  Nicole Henry is a pleasant 56 y.o. year old female who presents to clinic today with Annual Exam  and follow up of chronic medical conditions on 09/15/2017  HPI:  GYN is Dr. Paula Compton. DEXA 06/29/16- osteoporosis.  Stopped taking fosamax- she cannot remember why but thinks it did not make her feel well.  Colonoscopy 06/17/12  Anxiety- she has noticed more anxiety.  Did not like how buspar made her feel.  She would like to try something else.  Hypothyroidism- has been taking 100 mcg daily M- F and 50 mcg on Saturday and Sunday.  Denies any symptoms of hypo or hyperthyroidism. Due for labs.  Lab Results  Component Value Date   TSH 0.55 07/21/2016   HLD- stop taking the pravachol due to myalgias.  Lab Results  Component Value Date   CHOL 225 (H) 07/21/2016   HDL 52.30 07/21/2016   LDLCALC 148 (H) 07/21/2016   LDLDIRECT 151.4 05/12/2013   TRIG 126.0 07/21/2016   CHOLHDL 4 07/21/2016   Lab Results  Component Value Date   CREATININE 0.84 07/21/2016   Lab Results  Component Value Date   NA 140 07/21/2016   K 4.2 07/21/2016   CL 104 07/21/2016   CO2 29 07/21/2016   Lab Results  Component Value Date   WBC 3.9 (L) 07/21/2016   HGB 12.5 07/21/2016   HCT 37.7 07/21/2016   MCV 86.2 07/21/2016   PLT 275.0 07/21/2016   Lab Results  Component Value Date   TSH 0.55 07/21/2016   Current Outpatient Prescriptions on File Prior to Visit  Medication Sig Dispense Refill  . ALPRAZolam (XANAX) 0.25 MG tablet Take 1 tablet (0.25 mg total) by mouth at bedtime as needed for anxiety. 30 tablet 0  . SYNTHROID 50 MCG tablet TAKE 2 TABLETS BY MOUTH ON MONDAY THROUGH FRIDAY AND 1 TABLET SATURDAY AND SUNDAY 48 tablet 1   No current facility-administered medications on file prior to visit.     Allergies  Allergen Reactions  . Azithromycin Other (See Comments)    GI upset  .  Erythromycin Other (See Comments)    GI intolerance  . Nitrofurantoin Other (See Comments)    unknown  . Simvastatin     myaglias  . Penicillins Rash  . Sulfonamide Derivatives Rash  . Yellow Dyes (Non-Tartrazine) Rash    Past Medical History:  Diagnosis Date  . Allergic rhinitis due to pollen   . Basal cell carcinoma    history of  . Family history of diabetes mellitus   . GERD (gastroesophageal reflux disease)   . Hypothyroidism   . Pure hypercholesterolemia   . Rosacea   . Screening for lipoid disorders     Past Surgical History:  Procedure Laterality Date  . BREAST ENHANCEMENT SURGERY  1997    Family History  Problem Relation Age of Onset  . Hypertension Father   . Prostate cancer Father   . Heart disease Brother        ? heart issue  . Heart attack Paternal Grandfather        late age    Social History   Social History  . Marital status: Married    Spouse name: N/A  . Number of children: 2  . Years of education: N/A   Occupational History  .      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 Smoking status: Never Smoker  . Smokeless tobacco: Never Used  . Alcohol use No  . Drug use: No  . Sexual activity: Not on file   Other Topics Concern  . Not on file   Social History Narrative   Regular exercise- yes, running 2 to 3 times a week.   Diet- fruits and veggies.   The PMH, PSH, Social History, Family History, Medications, and allergies have been reviewed in [facility], and have been updated if relevant.  Review of Systems  Constitutional: Negative.   HENT: Negative.   Eyes: Negative.   Respiratory: Negative.   Cardiovascular: Negative.   Gastrointestinal: Negative.   Endocrine: Negative.   Genitourinary: Negative.   Musculoskeletal: Negative.   Allergic/Immunologic: Negative.   Neurological: Negative.   Hematological: Negative.   Psychiatric/Behavioral: Negative for agitation, behavioral problems, confusion,  decreased concentration, dysphoric mood, hallucinations, self-injury, sleep disturbance and suicidal ideas. The patient is nervous/anxious. The patient is not hyperactive.   All other systems reviewed and are negative.      Objective:    BP 130/80   Pulse 80   Ht 5\' 3"  (1.6 m)   Wt 137 lb (62.1 kg)   SpO2 98%   BMI 24.27 kg/m    Physical Exam  Constitutional: She is oriented to person, place, and time. She appears well-developed and well-nourished. No distress.  HENT:  Head: Normocephalic.  Eyes: Pupils are equal, round, and reactive to light. Conjunctivae are normal.  Neck: Normal range of motion. Neck supple. No tracheal deviation present. No thyromegaly present.  Cardiovascular: Normal rate and regular rhythm.   Pulmonary/Chest: Effort normal and breath sounds normal.  Abdominal: Soft.  Musculoskeletal: Normal range of motion.  Neurological: She is alert and oriented to person, place, and time. No cranial nerve deficit.  Skin: Skin is warm and dry. She is not diaphoretic.  Psychiatric: She has a normal mood and affect. Her behavior is normal. Judgment normal.  Nursing note and vitals reviewed.         Assessment & Plan:   Well woman exam - Plan: CBC with Differential/Platelet  Pure hypercholesterolemia - Plan: Comprehensive metabolic panel, Lipid panel  Hypothyroidism, unspecified type - Plan: TSH, T4, Free  Osteoporosis, unspecified osteoporosis type, unspecified pathological fracture presence - Plan: DG Bone Density No Follow-up on file.

## 2017-09-15 NOTE — Assessment & Plan Note (Signed)
Deteriorated. Start Zoloft 50 mg. Patient is to take 1/2 tablet daily for 10 days, then advance to 1 full tablet thereafter. We discussed possible side effects of headache, GI upset, drowsiness, and SI/HI. If thoughts of SI/HI develop, we discussed to present to the emergency immediately. Patient verbalized understanding.   Nicole Henry

## 2017-09-15 NOTE — Assessment & Plan Note (Signed)
Reviewed preventive care protocols, scheduled due services, and updated immunizations Discussed nutrition, exercise, diet, and healthy lifestyle.  

## 2017-09-15 NOTE — Assessment & Plan Note (Signed)
She stopped statin. Check labs today.

## 2017-09-15 NOTE — Assessment & Plan Note (Signed)
Continue current dose of synthroid.  Check labs today. 

## 2017-09-16 LAB — HEPATITIS C ANTIBODY
Hepatitis C Ab: NONREACTIVE
SIGNAL TO CUT-OFF: 0.01 (ref <1.00)

## 2017-09-17 ENCOUNTER — Other Ambulatory Visit: Payer: Self-pay | Admitting: Family Medicine

## 2017-09-17 ENCOUNTER — Encounter: Payer: Self-pay | Admitting: Family Medicine

## 2017-09-17 MED ORDER — PRAVASTATIN SODIUM 20 MG PO TABS: 20.0000 mg | ORAL_TABLET | Freq: Every day | ORAL | 1 refills | Status: DC

## 2017-10-28 ENCOUNTER — Other Ambulatory Visit: Payer: Self-pay | Admitting: Family Medicine

## 2017-11-03 ENCOUNTER — Other Ambulatory Visit: Payer: Self-pay | Admitting: Family Medicine

## 2017-11-03 ENCOUNTER — Encounter: Payer: Self-pay | Admitting: Family Medicine

## 2017-11-03 DIAGNOSIS — Z01419 Encounter for gynecological examination (general) (routine) without abnormal findings: Secondary | ICD-10-CM

## 2017-11-03 NOTE — Telephone Encounter (Signed)
Informed patient that initially we will have her come to [facility] for labs, until further advised. Patient was receptive to the information given & scheduled lab appointment for 11/09/17 at 8:00 AM. She did not have any further questions or concerns prior to call ending.

## 2017-11-09 ENCOUNTER — Other Ambulatory Visit (INDEPENDENT_AMBULATORY_CARE_PROVIDER_SITE_OTHER): Payer: Federal, State, Local not specified - PPO

## 2017-11-09 DIAGNOSIS — Z01419 Encounter for gynecological examination (general) (routine) without abnormal findings: Secondary | ICD-10-CM | POA: Diagnosis not present

## 2017-11-09 LAB — LIPID PANEL
CHOLESTEROL: 208 mg/dL — AB (ref 0–200)
HDL: 62.7 mg/dL (ref >39.00)
LDL Cholesterol: 129 mg/dL — ABNORMAL HIGH (ref 0–99)
NonHDL: 145.32
TRIGLYCERIDES: 82 mg/dL (ref 0.0–149.0)
Total CHOL/HDL Ratio: 3
VLDL: 16.4 mg/dL (ref 0.0–40.0)

## 2017-11-09 LAB — COMPREHENSIVE METABOLIC PANEL
ALBUMIN: 4.4 g/dL (ref 3.5–5.2)
ALK PHOS: 75 U/L (ref 39–117)
ALT: 14 U/L (ref 0–35)
AST: 20 U/L (ref 0–37)
BUN: 14 mg/dL (ref 6–23)
CO2: 31 mEq/L (ref 19–32)
Calcium: 9.7 mg/dL (ref 8.4–10.5)
Chloride: 103 mEq/L (ref 96–112)
Creatinine, Ser: 0.81 mg/dL (ref 0.40–1.20)
GFR: 77.73 mL/min (ref >60.00)
Glucose, Bld: 94 mg/dL (ref 70–99)
POTASSIUM: 4.7 meq/L (ref 3.5–5.1)
Sodium: 140 mEq/L (ref 135–145)
TOTAL PROTEIN: 7.2 g/dL (ref 6.0–8.3)
Total Bilirubin: 0.3 mg/dL (ref 0.2–1.2)

## 2017-11-09 LAB — CBC WITH DIFFERENTIAL/PLATELET
BASOS ABS: 0 10*3/uL (ref 0.0–0.1)
Basophils Relative: 0.5 % (ref 0.0–3.0)
EOS ABS: 0.2 10*3/uL (ref 0.0–0.7)
Eosinophils Relative: 3.2 % (ref 0.0–5.0)
HCT: 39.7 % (ref 36.0–46.0)
Hemoglobin: 12.9 g/dL (ref 12.0–15.0)
LYMPHS ABS: 1.8 10*3/uL (ref 0.7–4.0)
Lymphocytes Relative: 38.6 % (ref 12.0–46.0)
MCHC: 32.3 g/dL (ref 30.0–36.0)
MCV: 89.3 fl (ref 78.0–100.0)
MONO ABS: 0.5 10*3/uL (ref 0.1–1.0)
MONOS PCT: 10 % (ref 3.0–12.0)
NEUTROS ABS: 2.3 10*3/uL (ref 1.4–7.7)
Neutrophils Relative %: 47.7 % (ref 43.0–77.0)
PLATELETS: 292 10*3/uL (ref 150.0–400.0)
RBC: 4.45 Mil/uL (ref 3.87–5.11)
RDW: 13.9 % (ref 11.5–15.5)
WBC: 4.7 10*3/uL (ref 4.0–10.5)

## 2017-11-09 LAB — TSH: TSH: 8.81 u[IU]/mL — ABNORMAL HIGH (ref 0.35–4.50)

## 2018-01-11 ENCOUNTER — Other Ambulatory Visit: Payer: Self-pay

## 2018-01-11 ENCOUNTER — Encounter: Payer: Self-pay | Admitting: Family Medicine

## 2018-01-11 DIAGNOSIS — K080 Exfoliation of teeth due to systemic causes: Secondary | ICD-10-CM | POA: Diagnosis not present

## 2018-01-11 MED ORDER — SYNTHROID 50 MCG PO TABS: ORAL_TABLET | ORAL | 1 refills | Status: DC

## 2018-01-20 ENCOUNTER — Telehealth: Payer: Self-pay | Admitting: Family Medicine

## 2018-01-20 ENCOUNTER — Other Ambulatory Visit (INDEPENDENT_AMBULATORY_CARE_PROVIDER_SITE_OTHER): Payer: Federal, State, Local not specified - PPO

## 2018-01-20 ENCOUNTER — Other Ambulatory Visit: Payer: Self-pay

## 2018-01-20 ENCOUNTER — Ambulatory Visit (INDEPENDENT_AMBULATORY_CARE_PROVIDER_SITE_OTHER): Payer: Federal, State, Local not specified - PPO | Admitting: Behavioral Health

## 2018-01-20 ENCOUNTER — Telehealth: Payer: Self-pay

## 2018-01-20 DIAGNOSIS — E039 Hypothyroidism, unspecified: Secondary | ICD-10-CM | POA: Diagnosis not present

## 2018-01-20 DIAGNOSIS — Z23 Encounter for immunization: Secondary | ICD-10-CM | POA: Diagnosis not present

## 2018-01-20 LAB — T4, FREE: Free T4: 0.85 ng/dL (ref 0.60–1.60)

## 2018-01-20 LAB — TSH: TSH: 6.56 u[IU]/mL — AB (ref 0.35–4.50)

## 2018-01-20 MED ORDER — SYNTHROID 50 MCG PO TABS: ORAL_TABLET | ORAL | 1 refills | Status: DC

## 2018-01-20 MED ORDER — SERTRALINE HCL 50 MG PO TABS: 50.0000 mg | ORAL_TABLET | Freq: Every day | ORAL | 3 refills | Status: DC

## 2018-01-20 NOTE — Telephone Encounter (Signed)
Pt request refill for zoloft and synthroid. Please send to [facility].

## 2018-01-20 NOTE — Telephone Encounter (Signed)
This has been completed/thx dmf 

## 2018-01-20 NOTE — Telephone Encounter (Signed)
TSH & FT4 per TA ordered/thx dmf

## 2018-02-02 ENCOUNTER — Other Ambulatory Visit: Payer: Self-pay | Admitting: Gynecology

## 2018-02-02 DIAGNOSIS — N644 Mastodynia: Secondary | ICD-10-CM

## 2018-02-04 ENCOUNTER — Ambulatory Visit: Payer: Federal, State, Local not specified - PPO

## 2018-02-04 ENCOUNTER — Ambulatory Visit
Admission: RE | Admit: 2018-02-04 | Discharge: 2018-02-04 | Disposition: A | Discharge status: Home / Self Care | Payer: Federal, State, Local not specified - PPO | Source: Ambulatory Visit | Attending: Gynecology | Admitting: Gynecology

## 2018-02-04 DIAGNOSIS — R928 Other abnormal and inconclusive findings on diagnostic imaging of breast: Secondary | ICD-10-CM | POA: Diagnosis not present

## 2018-02-04 DIAGNOSIS — N644 Mastodynia: Secondary | ICD-10-CM

## 2018-03-03 ENCOUNTER — Encounter: Payer: Self-pay | Admitting: Family Medicine

## 2018-03-03 ENCOUNTER — Telehealth: Payer: Self-pay

## 2018-03-03 DIAGNOSIS — Z85828 Personal history of other malignant neoplasm of skin: Secondary | ICD-10-CM | POA: Diagnosis not present

## 2018-03-03 DIAGNOSIS — E039 Hypothyroidism, unspecified: Secondary | ICD-10-CM

## 2018-03-03 DIAGNOSIS — D485 Neoplasm of uncertain behavior of skin: Secondary | ICD-10-CM | POA: Diagnosis not present

## 2018-03-03 DIAGNOSIS — D225 Melanocytic nevi of trunk: Secondary | ICD-10-CM | POA: Diagnosis not present

## 2018-03-03 DIAGNOSIS — D1801 Hemangioma of skin and subcutaneous tissue: Secondary | ICD-10-CM | POA: Diagnosis not present

## 2018-03-03 DIAGNOSIS — L821 Other seborrheic keratosis: Secondary | ICD-10-CM | POA: Diagnosis not present

## 2018-03-03 DIAGNOSIS — D2262 Melanocytic nevi of left upper limb, including shoulder: Secondary | ICD-10-CM | POA: Diagnosis not present

## 2018-03-03 NOTE — Telephone Encounter (Signed)
Per TA "Yes please order FT4, Ft3 and TSH"/MyChart message sent to pt/created future order/thx dmf

## 2018-03-08 ENCOUNTER — Other Ambulatory Visit (INDEPENDENT_AMBULATORY_CARE_PROVIDER_SITE_OTHER): Payer: Federal, State, Local not specified - PPO

## 2018-03-08 DIAGNOSIS — E039 Hypothyroidism, unspecified: Secondary | ICD-10-CM

## 2018-03-08 LAB — T4, FREE: Free T4: 0.84 ng/dL (ref 0.60–1.60)

## 2018-03-08 LAB — TSH: TSH: 0.1 u[IU]/mL — ABNORMAL LOW (ref 0.35–4.50)

## 2018-03-08 LAB — T3, FREE: T3 FREE: 2.8 pg/mL (ref 2.3–4.2)

## 2018-03-09 ENCOUNTER — Telehealth: Payer: Self-pay

## 2018-03-09 DIAGNOSIS — E038 Other specified hypothyroidism: Secondary | ICD-10-CM

## 2018-03-09 DIAGNOSIS — E063 Autoimmune thyroiditis: Principal | ICD-10-CM

## 2018-03-09 MED ORDER — SYNTHROID 50 MCG PO TABS: 50.0000 ug | ORAL_TABLET | Freq: Every day | ORAL | 0 refills | Status: DC

## 2018-03-09 NOTE — Telephone Encounter (Signed)
I tried to call pt but VM is not set up/I sent a detailed MyChart message advising of the answers and to plz respond back to inform me that she knows to decrease Synthroid to 42mcg 1qd and is she is also in agreement with seeing an Endocrinologist/thx dmf

## 2018-03-09 NOTE — Telephone Encounter (Signed)
TA-I discussed lab results with the patient/she states "I feel terrible.  I am so exhausted."  She wants to know what it means when the TSH is out of range but the T3&4 are WNL?  Also; would you like to change how she takes her medication? See as follows:  Synthroid 50mcg 2qd Mon-Fri 1qs Sat-Sun  Plz advise/thx dmf

## 2018-03-09 NOTE — Telephone Encounter (Signed)
Yes, that is a great question.  Usually when a TSH is so low, the T3 and T4 will be high. So this is a type of thyroiditis or inflammation/irregularity of her thyroid.  For now, I would like her to decrease synthroid to 50 mcg daily and refer her to see an endocrinologist.  Would she be okay with that?

## 2018-03-09 NOTE — Telephone Encounter (Signed)
-----   Message from Lucille Passy, MD sent at 03/08/2018  5:39 PM EDT ----- Please call pt- her thyroid function is off.  How has she been feeling?

## 2018-03-16 DIAGNOSIS — L905 Scar conditions and fibrosis of skin: Secondary | ICD-10-CM | POA: Diagnosis not present

## 2018-03-16 DIAGNOSIS — D485 Neoplasm of uncertain behavior of skin: Secondary | ICD-10-CM | POA: Diagnosis not present

## 2018-03-28 DIAGNOSIS — K080 Exfoliation of teeth due to systemic causes: Secondary | ICD-10-CM | POA: Diagnosis not present

## 2018-06-15 ENCOUNTER — Encounter: Payer: Self-pay | Admitting: Family Medicine

## 2018-06-15 ENCOUNTER — Ambulatory Visit: Payer: Federal, State, Local not specified - PPO | Admitting: Family Medicine

## 2018-06-15 VITALS — BP 138/82 | HR 82 | Temp 98.6°F | Ht 63.0 in | Wt 132.6 lb

## 2018-06-15 DIAGNOSIS — J020 Streptococcal pharyngitis: Secondary | ICD-10-CM | POA: Diagnosis not present

## 2018-06-15 DIAGNOSIS — R070 Pain in throat: Secondary | ICD-10-CM | POA: Diagnosis not present

## 2018-06-15 LAB — POCT RAPID STREP A (OFFICE): Rapid Strep A Screen: POSITIVE — AB

## 2018-06-15 MED ORDER — AZITHROMYCIN 250 MG PO TABS: ORAL_TABLET | ORAL | 0 refills | Status: DC

## 2018-06-15 NOTE — Patient Instructions (Signed)

## 2018-06-15 NOTE — Progress Notes (Signed)
SUBJECTIVE: 57 y.o. female with sore throat, myalgias, swollen glands, headache and fever for 4 days. No history of rheumatic fever. Other symptoms: fatigue, malaise.  Current Outpatient Medications on File Prior to Visit  Medication Sig Dispense Refill  . ALPRAZolam (XANAX) 0.25 MG tablet Take 1 tablet (0.25 mg total) by mouth at bedtime as needed for anxiety. 30 tablet 0  . pravastatin (PRAVACHOL) 20 MG tablet Take 1 tablet (20 mg total) by mouth daily. 90 tablet 1  . sertraline (ZOLOFT) 50 MG tablet Take 1 tablet (50 mg total) by mouth daily. 30 tablet 3  . SYNTHROID 50 MCG tablet Take 1 tablet (50 mcg total) by mouth daily before breakfast. 30 tablet 0   No current facility-administered medications on file prior to visit.     Allergies  Allergen Reactions  . Azithromycin Other (See Comments)    GI upset  . Ciprofloxacin   . Erythromycin Other (See Comments)    GI intolerance  . Nitrofurantoin Other (See Comments)    unknown  . Simvastatin     myaglias  . Penicillins Rash  . Sulfonamide Derivatives Rash  . Yellow Dyes (Non-Tartrazine) Rash    Past Medical History:  Diagnosis Date  . Allergic rhinitis due to pollen   . Basal cell carcinoma    history of  . Family history of diabetes mellitus   . GERD (gastroesophageal reflux disease)   . Hypothyroidism   . Pure hypercholesterolemia   . Rosacea   . Screening for lipoid disorders     Past Surgical History:  Procedure Laterality Date  . AUGMENTATION MAMMAPLASTY Bilateral   . BREAST ENHANCEMENT SURGERY  1997    Family History  Problem Relation Age of Onset  . Hypertension Father   . Prostate cancer Father   . Heart disease Brother        ? heart issue  . Heart attack Paternal Grandfather        late age    Social History   Socioeconomic History  . Marital status: Married    Spouse name: Not on file  . Number of children: 2  . Years of education: Not on file  . 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 Financial resource strain: Not on file  . Food insecurity:    Worry: Not on file    Inability: Not on file  . Transportation needs:    Medical: Not on file    Non-medical: Not on file  Tobacco Use  . Smoking status: Never Smoker  . Smokeless tobacco: Never Used  Substance and Sexual Activity  . Alcohol use: No  . Drug use: No  . Sexual activity: Not on file  Lifestyle  . Physical activity:    Days per week: Not on file    Minutes per session: Not on file  . Stress: Not on file  Relationships  . Social connections:    Talks on phone: Not on file    Gets together: Not on file    Attends religious service: Not on file    Active member of club or organization: Not on file    Attends meetings of clubs or organizations: Not on file    Relationship status: Not on file  . Intimate partner violence:    Fear of current or ex partner: Not on file    Emotionally abused: Not on file  Physically abused: Not on file    Forced sexual activity: Not on file  Other Topics Concern  . Not on file  Social History Narrative   Regular exercise- yes, running 2 to 3 times a week.   Diet- fruits and veggies.   The PMH, PSH, Social History, Family History, Medications, and allergies have been reviewed in [facility], and have been updated if relevant.  OBJECTIVE:  BP 138/82 (BP Location: Left Arm, Patient Position: Sitting, Cuff Size: Normal)   Pulse 82   Temp 98.6 F (37 C) (Oral)   Ht 5\' 3"  (1.6 m)   Wt 132 lb 9.6 oz (60.1 kg)   SpO2 98%   BMI 23.49 kg/m   Vitals as noted above. Appears alert, well appearing, and in no distress, oriented to person, place, and time and normal appearing weight. Ears: bilateral TM's and external ear canals normal Oropharynx: erythematous Neck: supple, no significant adenopathy Lungs: clear to auscultation, no wheezes, rales or rhonchi, symmetric air entry Rapid Strep  test is positive  ASSESSMENT: Streptococcal pharyngitis- PCN allergic.  Treat with Azithromycin 500 mg daily x 3 days. See AVS for supportive care. Call or return to clinic prn if these symptoms worsen or fail to improve as anticipated. The patient indicates understanding of these issues and agrees with the plan.   PLAN: Per orders. Gargle, use acetaminophen or other OTC analgesic, and take Rx fully as prescribed. Call if other family members develop similar symptoms. See prn.

## 2018-06-17 ENCOUNTER — Encounter: Payer: Self-pay | Admitting: Family Medicine

## 2018-06-17 DIAGNOSIS — J020 Streptococcal pharyngitis: Secondary | ICD-10-CM

## 2018-06-17 MED ORDER — AZITHROMYCIN 250 MG PO TABS: 250.0000 mg | ORAL_TABLET | Freq: Every day | ORAL | 0 refills | Status: DC

## 2018-06-27 ENCOUNTER — Encounter: Payer: Self-pay | Admitting: Internal Medicine

## 2018-06-27 ENCOUNTER — Ambulatory Visit: Payer: Federal, State, Local not specified - PPO | Admitting: Internal Medicine

## 2018-06-27 VITALS — BP 134/78 | HR 72 | Ht 63.0 in | Wt 133.2 lb

## 2018-06-27 DIAGNOSIS — M810 Age-related osteoporosis without current pathological fracture: Secondary | ICD-10-CM | POA: Diagnosis not present

## 2018-06-27 DIAGNOSIS — E063 Autoimmune thyroiditis: Secondary | ICD-10-CM

## 2018-06-27 DIAGNOSIS — E038 Other specified hypothyroidism: Secondary | ICD-10-CM

## 2018-06-27 LAB — T4, FREE: Free T4: 0.59 ng/dL — ABNORMAL LOW (ref 0.60–1.60)

## 2018-06-27 LAB — TSH: TSH: 32.97 u[IU]/mL — ABNORMAL HIGH (ref 0.35–4.50)

## 2018-06-27 LAB — VITAMIN D 25 HYDROXY (VIT D DEFICIENCY, FRACTURES): VITD: 23.38 ng/mL — ABNORMAL LOW (ref 30.00–100.00)

## 2018-06-27 NOTE — Patient Instructions (Addendum)
Please stop at the lab.  Please continue Synthroid 50 mcg daily.  Take the thyroid hormone every day, with water, at least 30 minutes before breakfast, coffee + cream, juice, separated by at least 4 hours from: - acid reflux medications - calcium - iron - multivitamins  We will schedule a new DXA scan for you.  Please stop at the lab.  Please come back for a follow-up appointment in 6 months.  How Can I Prevent Falls? Men and women with osteoporosis need to take care not to fall down. Falls can break bones. Some reasons people fall are: Poor vision  Poor balance  Certain diseases that affect how you walk  Some types of medicine, such as sleeping pills.  Some tips to help prevent falls outdoors are: Use a cane or walker  Wear rubber-soled shoes so you don't slip  Walk on grass when sidewalks are slippery  In winter, put salt or kitty litter on icy sidewalks.  Some ways to help prevent falls indoors are: Keep rooms free of clutter, especially on floors  Use plastic or carpet runners on slippery floors  Wear low-heeled shoes that provide good support  Do not walk in socks, stockings, or slippers  Be sure carpets and area rugs have skid-proof backs or are tacked to the floor  Be sure stairs are well lit and have rails on both sides  Put grab bars on bathroom walls near tub, shower, and toilet  Use a rubber bath mat in the shower or tub  Keep a flashlight next to your bed  Use a sturdy step stool with a handrail and wide steps  Add more lights in rooms (and night lights) Buy a cordless phone to keep with you so that you don't have to rush to the phone       when it rings and so that you can call for help if you fall.   (adapted from http://www.niams.NightlifePreviews.se)  Please check out the following book about best diet for bone health:    Exercise for Strong Bones (from [place]) There are two types of  exercises that are important for building and maintaining bone density:  weight-bearing and muscle-strengthening exercises. Weight-bearing Exercises These exercises include activities that make you move against gravity while staying upright. Weight-bearing exercises can be high-impact or low-impact. High-impact weight-bearing exercises help build bones and keep them strong. If you have broken a bone due to osteoporosis or are at risk of breaking a bone, you may need to avoid high-impact exercises. If you're not sure, you should check with your healthcare provider. Examples of high-impact weight-bearing exercises are: . Dancing . Doing high-impact aerobics . Hiking . Jogging/running . Jumping Rope . Stair climbing . Tennis Low-impact weight-bearing exercises can also help keep bones strong and are a safe alternative if you cannot do high-impact exercises. Examples of low-impact weight-bearing exercises are: . Using elliptical training machines . Doing low-impact aerobics . Using stair-step machines . Fast walking on a treadmill or outside Muscle-Strengthening Exercises These exercises include activities where you move your body, a weight or some other resistance against gravity. They are also known as resistance exercises and include: . Lifting weights . Using elastic exercise bands . Using weight machines . Lifting your own body weight . Functional movements, such as standing and rising up on your toes Yoga and Pilates can also improve strength, balance and flexibility. However, certain positions may not be safe for people with osteoporosis or those at increased risk  of broken bones. For example, exercises that have you bend forward may increase the chance of breaking a bone in the spine. A physical therapist should be able to help you learn which exercises are safe and appropriate for you. Non-Impact Exercises Non-impact exercises can help you to improve balance, posture and how well you  move in everyday activities. These exercises can also help to increase muscle strength and decrease the risk of falls and broken bones. Some of these exercises include: . Balance exercises that strengthen your legs and test your balance, such as Tai Chi, can decrease your risk of falls. . Posture exercises that improve your posture and reduce rounded or "sloping" shoulders can help you decrease the chance of breaking a bone, especially in the spine. . Functional exercises that improve how well you move can help you with everyday activities and decrease your chance of falling and breaking a bone. For example, if you have trouble getting up from a chair or climbing stairs, you should do these activities as exercises. A physical therapist can teach you balance, posture and functional exercises. Starting a New Exercise Program If you haven't exercised regularly for a while, check with your healthcare provider before beginning a new exercise program-particularly if you have health problems such as heart disease, diabetes or high blood pressure. If you're at high risk of breaking a bone, you should work with a physical therapist to develop a safe exercise program. Once you have your healthcare provider's approval, start slowly. If you've already broken bones in the spine because of osteoporosis, be very careful to avoid activities that require reaching down, bending forward, rapid twisting motions, heavy lifting and those that increase your chance of a fall. As you get started, your muscles may feel sore for a day or two after you exercise. If soreness lasts longer, you may be working too hard and need to ease up. Exercises should be done in a pain-free range of motion. How Much Exercise Do You Need? Weight-bearing exercises 30 minutes on most days of the week. Do a 30-minutesession or multiple sessions spread out throughout the day. The benefits to your bones are the same.   Muscle-strengthening exercises Two  to three days per week. If you don't have much time for strengthening/resistance training, do small amounts at a time. You can do just one body part each day. For example do arms one day, legs the next and trunk the next. You can also spread these exercises out during your normal day.  Balance, posture and functional exercises Every day or as often as needed. You may want to focus on one area more than the others. If you have fallen or lose your balance, spend time doing balance exercises. If you are getting rounded shoulders, work more on posture exercises. If you have trouble climbing stairs or getting up from the couch, do more functional exercises. You can also perform these exercises at one time or spread them during your day. Work with a phyiscal therapist to learn the right exercises for you.

## 2018-06-27 NOTE — Progress Notes (Addendum)
Patient ID: Nicole Henry, female   DOB: 10/21/1961, 57 y.o.   MRN: [id number]    HPI  Nicole Henry is a 57 y.o.-year-old female, referred by her PCP, Dr. Deborra Medina, for management of uncontrolled Hashimoto's hypothyroidism.   Pt. has been dx with hypothyroidism in ~2009 (fatigue, TSH 16) >> on Synthroid DAW 50 mcg (last dose decreased in 02/2018: 5/7 75 mcg, 2/7 50 mcg). She cannot take the LT4 with dyes.  She takes the thyroid hormone: - fasting - with water - coffee + cream ~30 min later - Juice ~30 min later - separated by >30 min from b'fast  - no calcium, iron - + multivitamin in am (!) 1h after LT4 - + Omeprazole prn, occas. In am  I reviewed pt's thyroid tests: Lab Results  Component Value Date   TSH 0.10 (L) 03/08/2018   TSH 6.56 (H) 01/20/2018   TSH 8.81 (H) 11/09/2017   TSH 5.95 (H) 09/15/2017   TSH 0.55 07/21/2016   TSH 6.56 (H) 07/04/2015   TSH 0.71 04/30/2015   TSH 0.51 08/24/2014   TSH 0.38 08/08/2014   TSH 11.30 (H) 07/25/2014   FREET4 0.84 03/08/2018   FREET4 0.85 01/20/2018   FREET4 0.90 09/15/2017   FREET4 1.00 07/21/2016   FREET4 0.84 07/04/2015   FREET4 1.22 04/30/2015   FREET4 1.12 08/24/2014   FREET4 1.29 08/08/2014   FREET4 1.28 07/25/2014   FREET4 0.33 (L) 07/16/2014   T3FREE 2.8 03/08/2018   T3FREE 2.7 07/25/2014   Antithyroid antibodies: No results found for: THGAB No components found for: TPOAB  Pt describes: - weight gain - fatigue - cold intolerance - depression - constipation - dry skin - hair loss  Pt denies feeling nodules in neck, hoarseness, dysphagia/odynophagia, SOB with lying down.  She has no FH of thyroid disorders. No FH of thyroid cancer.  No h/o radiation tx to head or neck. No recent use of iodine supplements.  Pt. also has a history of OP.   Also, HL.  ROS: Constitutional: no weight gain/loss, + fatigue, + subjective hypothermia Eyes: no blurry vision, no xerophthalmia ENT: no sore throat, no nodules  palpated in throat, no dysphagia/odynophagia, no hoarseness Cardiovascular: no CP/SOB/palpitations/leg swelling Respiratory: no cough/SOB Gastrointestinal: no N/V/D/C Musculoskeletal: no muscle/joint aches Skin: no rashes, + hair loss Neurological: no tremors/numbness/tingling/dizziness Psychiatric: no depression/anxiety  Past Medical History:  Diagnosis Date  . Allergic rhinitis due to pollen   . Basal cell carcinoma    history of  . Family history of diabetes mellitus   . GERD (gastroesophageal reflux disease)   . Hypothyroidism   . Pure hypercholesterolemia   . Rosacea   . Screening for lipoid disorders    Past Surgical History:  Procedure Laterality Date  . AUGMENTATION MAMMAPLASTY Bilateral   . BREAST ENHANCEMENT SURGERY  1997   Social History   Socioeconomic History  . Marital status: Married    Spouse name: Not on file  . Number of children: 2  . Years of education: Not on file  . 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 Financial resource strain: Not on file  . Food insecurity:    Worry: Not on file    Inability: Not on file  . Transportation needs:    Medical: Not on file    Non-medical: Not on file  Tobacco Use  . Smoking status: Never Smoker  . Smokeless  tobacco: Never Used  Substance and Sexual Activity  . Alcohol use: No  . Drug use: No  . Sexual activity: Not on file  Lifestyle  . Physical activity:    Days per week: Not on file    Minutes per session: Not on file  . Stress: Not on file  Relationships  . Social connections:    Talks on phone: Not on file    Gets together: Not on file    Attends religious service: Not on file    Active member of club or organization: Not on file    Attends meetings of clubs or organizations: Not on file    Relationship status: Not on file  . Intimate partner violence:    Fear of current or ex partner: Not on file     Emotionally abused: Not on file    Physically abused: Not on file    Forced sexual activity: Not on file  Other Topics Concern  . Not on file  Social History Narrative   Regular exercise- yes, running 2 to 3 times a week.   Diet- fruits and veggies.   Current Outpatient Medications on File Prior to Visit  Medication Sig Dispense Refill  . pravastatin (PRAVACHOL) 20 MG tablet Take 1 tablet (20 mg total) by mouth daily. 90 tablet 1  . sertraline (ZOLOFT) 50 MG tablet Take 1 tablet (50 mg total) by mouth daily. 30 tablet 3  . SYNTHROID 50 MCG tablet Take 1 tablet (50 mcg total) by mouth daily before breakfast. 30 tablet 0   No current facility-administered medications on file prior to visit.    Allergies  Allergen Reactions  . Ciprofloxacin   . Nitrofurantoin Other (See Comments)    unknown  . Simvastatin     myaglias  . Penicillins Rash  . Sulfonamide Derivatives Rash  . Yellow Dyes (Non-Tartrazine) Rash   Family History  Problem Relation Age of Onset  . Hypertension Father   . Prostate cancer Father   . Heart disease Brother        ? heart issue  . Heart attack Paternal Grandfather        late age    PE: BP 134/78   Pulse 72   Ht 5\' 3"  (1.6 m)   Wt 133 lb 3.2 oz (60.4 kg)   SpO2 96%   BMI 23.60 kg/m  Wt Readings from Last 3 Encounters:  06/27/18 133 lb 3.2 oz (60.4 kg)  06/15/18 132 lb 9.6 oz (60.1 kg)  09/15/17 137 lb (62.1 kg)   Constitutional: Normal weight, in NAD Eyes: PERRLA, EOMI, no exophthalmos ENT: moist mucous membranes, no thyromegaly, no cervical lymphadenopathy Cardiovascular: RRR, No MRG Respiratory: CTA B Gastrointestinal: abdomen soft, NT, ND, BS+ Musculoskeletal: no deformities, strength intact in all 4 Skin: moist, warm, no rashes Neurological: no tremor with outstretched hands, DTR normal in all 4  ASSESSMENT: 1. Hypothyroidism  2. Hashimoto's thyroiditis  3. Osteoporosis   PLAN:  1. Patient with long-standing hypothyroidism, on  levothyroxine therapy. - she appears euthyroid.  She feels much better after decreasing the dose of levothyroxine to 50 mcg daily ~4 months ago. - she does not appear to have a goiter, thyroid nodules, or neck compression symptoms - We discussed about correct intake of levothyroxine, fasting, with water, separated by at least 30 minutes from breakfast, and separated by more than 4 hours from calcium, iron, multivitamins, acid reflux medications (PPIs). - will check thyroid tests today: TSH, free T4 and will  also add TPO and ATA antibodies - If labs today are abnormal, she will need to return in ~6 weeks for repeat labs - Otherwise, I will see her back in 6 months  2. Hashimoto's thyroiditis - we had a long discussion about her Hashimoto thyroiditis diagnosis. I explained that this is an autoimmune disorder, in which she develops antibodies against her own thyroid. The antibodies bind to the thyroid tissue and cause inflammation, and, eventually, destruction of the gland and hypothyroidism. We don't know how long this process can be, it can last from months to years. - I also explained that thyroid enlargement especially at the beginning of her Hashimoto thyroiditis course is not uncommon, and it has a waxing and waning character.  - We discussed about treatment for Hashimoto thyroiditis, which is actually limited to thyroid hormones in case her TFTs are abnormal. Supplements like selenium has been tried with various results, some showing improvement in the TPO antibodies. However, there are no randomized controlled trials of this are consistent results between trials. We also discussed about ways to improve her immune system (relaxation, diet, exercise, sleep) to reduce the Ab titer and, subsequently, the thyroid inflammation.  3. Osteoporosis  - At today's visit, she brings out the fact that she also has osteoporosis based on bone density scan from 06/2016 - She was diagnosed when she was 45, which  is quite early after menopause.  Another bone density scan was planned for this year >> since it is almost 2 years since the previous, I ordered a bone density scan at the breast center. - Discussed about increased risk of fracture, depending on the T score, greatly increased when the T score is lower than -2.5. We reviewed her DXA scores together, and I explained that based on the T scores, she has an increased risk for fractures.  - given handout from [employer] [facility]: weight bearing exercises - advised to do this every day or at least 5/7 days - Discussed about the importance of an alkaline diet - I recommended the following book:  - We discussed about the different medication classes, benefits and side effects (including atypical fractures and ONJ - no dental workup in progress or planned).  However, she did discuss with her dentist about this and he is strongly discouraged the use. - I explained that if the new DEXA scan still shows osteoporosis, we can either use oral or IV bisphosphonates or sq denosumab (Prolia). I would use Teriparatide/Abaloparatide as a last resort. I explained the expected benefits.  - Will check a vitamin D level today to make sure she is not vitamin D deficient  Needs refills of levothyroxine.   Component     Latest Ref Rng & Units 06/27/2018  TSH     0.35 - 4.50 uIU/mL 32.97 (H)  T4,Free(Direct)     0.60 - 1.60 ng/dL 0.59 (L)  Thyroperoxidase Ab SerPl-aCnc     <9 IU/mL 127 (H)  Thyroglobulin Ab     < or = 1 IU/mL 9 (H)  VITD     30.00 - 100.00 ng/mL 23.38 (L)   Labs confirm Hashimoto's thyroiditis.  Her TSH is very high.  We will need to increase her dose of levothyroxine 200 mcg daily.  We will have her back for labs in 1.5-2 months. Her vitamin D is also low.  I will suggest to start 2000 units vitamin D daily and will recheck when she comes back for the thyroid tests.  New DXA  report (09/01/2018): RFN 09/01/2018     -2.4  RFN  06/29/2016    -2.5  LFN 09/01/2018      -2.3 LFN 06/29/2016       -2.2  AP Spine L1-L4   09/01/2018  56.8     -1.4  1.015 g/cm2 (+1%) AP Spine L1-L4   06/29/2016  54.6     -1.5  1.005 g/cm2  DualFemur Total Mean 09/01/2018  56.8     -1.3  0.838 g/cm2 DualFemur Total Mean 06/29/2016  54.6     -1.5  0.824 g/cm2  FRAX: Major osteoporosis fracture risk over 10 years: 9.5% Hip fracture risk over 10 years, 1.6%  Her T-scores are stable.  They are in the osteopenic range.  FRAX is not above the target for treatment which is 20% / 3%, respectively for MOF/hip fracture risk.  I will discuss with the patient.  We can continue to monitor for now and repeat a bone density scan in 1 to 2 years.  In the meantime, improve vitamin D level thyroid tests, exercise.   Philemon Kingdom, MD PhD [facility]

## 2018-06-28 LAB — THYROGLOBULIN ANTIBODY: THYROGLOBULIN AB: 9 [IU]/mL — AB (ref <=1)

## 2018-06-28 LAB — THYROID PEROXIDASE ANTIBODY: THYROID PEROXIDASE ANTIBODY: 127 [IU]/mL — AB (ref <9)

## 2018-06-29 MED ORDER — SYNTHROID 50 MCG PO TABS: 100.0000 ug | ORAL_TABLET | Freq: Every day | ORAL | 5 refills | Status: DC

## 2018-07-15 ENCOUNTER — Other Ambulatory Visit: Payer: Self-pay | Admitting: Internal Medicine

## 2018-07-18 ENCOUNTER — Other Ambulatory Visit: Payer: Self-pay | Admitting: Family Medicine

## 2018-07-18 NOTE — Telephone Encounter (Signed)
Pt states that she is completely out and would like this called in as soon as possible.

## 2018-07-20 ENCOUNTER — Other Ambulatory Visit: Payer: Self-pay

## 2018-07-20 MED ORDER — SERTRALINE HCL 50 MG PO TABS: 50.0000 mg | ORAL_TABLET | Freq: Every day | ORAL | 3 refills | Status: DC

## 2018-08-23 ENCOUNTER — Other Ambulatory Visit: Payer: Federal, State, Local not specified - PPO

## 2018-09-01 ENCOUNTER — Ambulatory Visit
Admission: RE | Admit: 2018-09-01 | Discharge: 2018-09-01 | Disposition: A | Discharge status: Home / Self Care | Payer: Federal, State, Local not specified - PPO | Source: Ambulatory Visit | Attending: Internal Medicine | Admitting: Internal Medicine

## 2018-09-01 ENCOUNTER — Ambulatory Visit (INDEPENDENT_AMBULATORY_CARE_PROVIDER_SITE_OTHER): Payer: Federal, State, Local not specified - PPO

## 2018-09-01 ENCOUNTER — Ambulatory Visit: Payer: Federal, State, Local not specified - PPO | Admitting: Family Medicine

## 2018-09-01 DIAGNOSIS — Z780 Asymptomatic menopausal state: Secondary | ICD-10-CM | POA: Diagnosis not present

## 2018-09-01 DIAGNOSIS — Z23 Encounter for immunization: Secondary | ICD-10-CM

## 2018-09-01 DIAGNOSIS — M8589 Other specified disorders of bone density and structure, multiple sites: Secondary | ICD-10-CM | POA: Diagnosis not present

## 2018-09-01 DIAGNOSIS — M810 Age-related osteoporosis without current pathological fracture: Secondary | ICD-10-CM

## 2018-09-01 NOTE — Progress Notes (Signed)
Pt presented today for influenza vaccination. IM injection given in R deltoid. Pt tolerated well. No observed S/S prior to leaving office. Pt given influenza vaccination information.

## 2018-09-05 ENCOUNTER — Other Ambulatory Visit: Payer: Self-pay

## 2018-09-06 ENCOUNTER — Encounter: Payer: Self-pay | Admitting: Family Medicine

## 2018-09-06 ENCOUNTER — Ambulatory Visit: Payer: Federal, State, Local not specified - PPO | Admitting: Family Medicine

## 2018-09-06 VITALS — BP 134/76 | HR 80 | Temp 99.1°F | Ht 63.0 in | Wt 135.6 lb

## 2018-09-06 DIAGNOSIS — R42 Dizziness and giddiness: Secondary | ICD-10-CM | POA: Insufficient documentation

## 2018-09-06 MED ORDER — MECLIZINE HCL 12.5 MG PO TABS: 12.5000 mg | ORAL_TABLET | Freq: Three times a day (TID) | ORAL | 0 refills | Status: DC | PRN

## 2018-09-06 NOTE — Assessment & Plan Note (Signed)
New- history and exam most consistent with benign positional vertigo. No other red flag signs or symptoms concerning at this point for other causes of vertigo. >25 minutes spent in face to face time with patient, >50% spent in counselling or coordination of care eRx sent for meclizine- discussed sedation potential. Hand out given and discussed at length about home Eply maneuver. Call or return to clinic prn if these symptoms worsen or fail to improve as anticipated. Refer to ENT/vestibular rehab if symptoms do not improve within 2 days. The patient indicates understanding of these issues and agrees with the plan.

## 2018-09-06 NOTE — Progress Notes (Signed)
Subjective:   Patient ID: Nicole Henry, female    DOB: Dec 09, 1961, 57 y.o.   MRN: [id number]  Nicole Henry is a pleasant 57 y.o. year old female who presents to clinic today with Dizziness (Patient is here today C/O dizziness.  She states that it happens when she first gets up in the morning and when turns her head suddenly.  Is wanting to see if it is an inner ear issue as she is getting ready to fly. Sx started 2-3 weeks ago.  She states that she has frontal sinus pressure that is moderate in nature.  Denies any post-nasal drip.)  on 09/06/2018  HPI:  Dizziness- started 2-3 weeks ago.  Notices it when she first wakes up to get out of bed and turns her head suddenly.  Has never had anything like this before.  It is associated with nausea, no vomiting yet but has been quite nauseated with it.  Also has felt it when she looks up suddenly at work or changes head positions suddenly at other times.  Has not tried any treatment for it.  She has had some intermittent sinus headaches but no other URI symptoms.  No ear pain, no balance issues. No visual changes. No hearing loss.  Current Outpatient Medications on File Prior to Visit  Medication Sig Dispense Refill  . pravastatin (PRAVACHOL) 20 MG tablet Take 1 tablet (20 mg total) by mouth daily. 90 tablet 1  . sertraline (ZOLOFT) 50 MG tablet TAKE 1 TABLET BY MOUTH EVERY DAY 90 tablet 0  . SYNTHROID 50 MCG tablet Take 2 tablets (100 mcg total) by mouth daily before breakfast. 120 tablet 5   No current facility-administered medications on file prior to visit.     Allergies  Allergen Reactions  . Ciprofloxacin   . Nitrofurantoin Other (See Comments)    unknown  . Simvastatin     myaglias  . Penicillins Rash  . Sulfonamide Derivatives Rash  . Yellow Dyes (Non-Tartrazine) Rash    Past Medical History:  Diagnosis Date  . Allergic rhinitis due to pollen   . Basal cell carcinoma    history of  . Family history of diabetes  mellitus   . GERD (gastroesophageal reflux disease)   . Hypothyroidism   . Pure hypercholesterolemia   . Rosacea   . Screening for lipoid disorders     Past Surgical History:  Procedure Laterality Date  . AUGMENTATION MAMMAPLASTY Bilateral   . BREAST ENHANCEMENT SURGERY  1997    Family History  Problem Relation Age of Onset  . Hypertension Father   . Prostate cancer Father   . Heart disease Brother        ? heart issue  . Heart attack Paternal Grandfather        late age    Social History   Socioeconomic History  . Marital status: Married    Spouse name: Not on file  . Number of children: 2  . Years of education: Not on file  . 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 Financial resource strain: Not on file  . Food insecurity:    Worry: Not on file    Inability: Not on file  . Transportation needs:    Medical: Not on file    Non-medical: Not on file  Tobacco Use  . Smoking status: Never Smoker  . Smokeless tobacco: Never Used  Substance and Sexual Activity  . Alcohol use: No  . Drug use: No  . Sexual activity: Not on file  Lifestyle  . Physical activity:    Days per week: Not on file    Minutes per session: Not on file  . Stress: Not on file  Relationships  . Social connections:    Talks on phone: Not on file    Gets together: Not on file    Attends religious service: Not on file    Active member of club or organization: Not on file    Attends meetings of clubs or organizations: Not on file    Relationship status: Not on file  . Intimate partner violence:    Fear of current or ex partner: Not on file    Emotionally abused: Not on file    Physically abused: Not on file    Forced sexual activity: Not on file  Other Topics Concern  . Not on file  Social History Narrative   Regular exercise- yes, running 2 to 3 times a week.   Diet- fruits and veggies.   The PMH,  PSH, Social History, Family History, Medications, and allergies have been reviewed in [facility], and have been updated if relevant.  Review of Systems  Constitutional: Negative.   HENT: Positive for sinus pressure. Negative for ear discharge, ear pain, hearing loss, postnasal drip and tinnitus.   Eyes: Negative.   Cardiovascular: Negative.   Gastrointestinal: Positive for nausea. Negative for vomiting.  Skin: Negative.   Neurological: Positive for dizziness and headaches. Negative for tremors, seizures, syncope, facial asymmetry, speech difficulty, weakness, light-headedness and numbness.  All other systems reviewed and are negative.      Objective:    BP 134/76 (BP Location: Left Arm, Patient Position: Sitting, Cuff Size: Normal)   Pulse 80   Temp 99.1 F (37.3 C) (Oral)   Ht 5\' 3"  (1.6 m)   Wt 135 lb 9.6 oz (61.5 kg)   SpO2 98%   BMI 24.02 kg/m    Physical Exam  Constitutional: She is oriented to person, place, and time. She appears well-developed and well-nourished. No distress.  HENT:  Head: Normocephalic and atraumatic.  Right Ear: Hearing, tympanic membrane, external ear and ear canal normal.  Left Ear: Hearing, tympanic membrane, external ear and ear canal normal.  Eyes: Pupils are equal, round, and reactive to light.  + nystagmus with eply maneuver- right  Neck: Normal range of motion.  Musculoskeletal: Normal range of motion. She exhibits no edema.  Neurological: She is alert and oriented to person, place, and time. No cranial nerve deficit. Coordination normal.  Skin: Skin is warm and dry.  Psychiatric: She has a normal mood and affect. Her behavior is normal. Judgment and thought content normal.  Nursing note and vitals reviewed.         Assessment & Plan:   Vertigo No follow-ups on file.

## 2018-09-06 NOTE — Patient Instructions (Addendum)

## 2018-09-08 ENCOUNTER — Other Ambulatory Visit: Payer: Self-pay | Admitting: Family Medicine

## 2018-09-08 ENCOUNTER — Encounter: Payer: Self-pay | Admitting: Family Medicine

## 2018-09-08 DIAGNOSIS — H8111 Benign paroxysmal vertigo, right ear: Secondary | ICD-10-CM

## 2018-09-08 DIAGNOSIS — H832X9 Labyrinthine dysfunction, unspecified ear: Secondary | ICD-10-CM

## 2018-09-08 NOTE — Telephone Encounter (Signed)
FYI - Pt is requesting to see Rudell Cobb per message received. Referral put in [place]. This is the location where she works. They will call patient to schedule. I will follow up with this office tomorrow morning 09/09/18.

## 2018-09-09 NOTE — Telephone Encounter (Signed)
I called this morning 09/09/18 to [facility] to follow up on urgent referral request. There office has received the request and I was informed they will contact patient today before the end of the business day to schedule for a consultation and patient would be scheduled for a consultation for sometime next week .

## 2018-09-14 ENCOUNTER — Encounter: Payer: Self-pay | Admitting: Physical Therapy

## 2018-09-14 ENCOUNTER — Other Ambulatory Visit: Payer: Self-pay

## 2018-09-14 ENCOUNTER — Other Ambulatory Visit: Payer: Self-pay | Admitting: Family Medicine

## 2018-09-14 ENCOUNTER — Ambulatory Visit: Payer: Federal, State, Local not specified - PPO | Attending: Family Medicine | Admitting: Physical Therapy

## 2018-09-14 ENCOUNTER — Encounter: Payer: Self-pay | Admitting: Family Medicine

## 2018-09-14 DIAGNOSIS — R2681 Unsteadiness on feet: Secondary | ICD-10-CM

## 2018-09-14 DIAGNOSIS — R42 Dizziness and giddiness: Secondary | ICD-10-CM | POA: Diagnosis not present

## 2018-09-14 MED ORDER — HYDROCHLOROTHIAZIDE 12.5 MG PO CAPS: 12.5000 mg | ORAL_CAPSULE | Freq: Every day | ORAL | 0 refills | Status: DC

## 2018-09-14 NOTE — Therapy (Signed)
[facility] [address] [place], [place], [address] Phone: [phone]   Fax:  [phone]  Physical Therapy Evaluation  Patient Details  Name: Nicole Henry MRN: [id number] Date of Birth: [birth year] Referring Provider (PT): Dr. Arnette Norris   Encounter Date: 09/14/2018  PT End of Session - 09/14/18 1432    Visit Number  1    Number of Visits  5    Date for PT Re-Evaluation  11/09/18   extended POC date as patient is travelling out of the country   [place]    PT Start Time  [phone]    PT Stop Time  [phone]    PT Time Calculation (min)  46 min    Activity Tolerance  Patient tolerated treatment well    Behavior During Therapy  [facility] for tasks assessed/performed       Past Medical History:  Diagnosis Date  . Allergic rhinitis due to pollen   . Basal cell carcinoma    history of  . Family history of diabetes mellitus   . GERD (gastroesophageal reflux disease)   . Hypothyroidism   . Pure hypercholesterolemia   . Rosacea   . Screening for lipoid disorders     Past Surgical History:  Procedure Laterality Date  . AUGMENTATION MAMMAPLASTY Bilateral   . BREAST ENHANCEMENT SURGERY  1997    There were no vitals filed for this visit.   Subjective Assessment - 09/14/18 0851    Subjective  Has been seen by MD for vertigo. Not everyday. Does have increased symptoms today. MD gave instruction on Epley maneuver - patient and husband tried it - unsure if she did it correctly. Plans to travel to [place] on Tuesday. Patient reports dizziness every morning when waking up - has to sit on EOB for a few minutes. Has symptoms intermittently with turning head quickly. Does experience nausea. Sometime symptoms last seconds, other times longer.     Pertinent History  no significant co-morbidities    Patient Stated Goals  improve dizziness    Currently in Pain?  No/denies    Multiple Pain Sites  No          [facility] PT Assessment - 09/14/18 0859      Assessment   Medical Diagnosis  BPPV, vestibular dysfunction    Referring Provider (PT)  Dr. Arnette Norris    Onset Date/Surgical Date  --   ~1 month ago   Next MD Visit  --   prn   Prior Therapy  no      Precautions   Precautions  None      Restrictions   Weight Bearing Restrictions  No      Balance Screen   Has the patient fallen in the past 6 months  No    Has the patient had a decrease in activity level because of a fear of falling?   No    Is the patient reluctant to leave their home because of a fear of falling?   No      Home Film/video editor residence      Prior Function   Level of Independence  Independent    Vocation  Full time employment    Vocation Requirements  urban development    Leisure  travelling to [place] on Tuesday      Cognition   Overall Cognitive Status  Within Functional Limits for tasks assessed  Posture/Postural Control   Posture/Postural Control  Postural limitations    Postural Limitations  Rounded Shoulders;Forward head      ROM / Strength   AROM / PROM / Strength  AROM      AROM   Overall AROM   Within functional limits for tasks performed    Overall AROM Comments  Cervical           Vestibular Assessment - 09/14/18 0859      Vestibular Assessment   General Observation  wears reading glasses; symptoms present for ~1 month; greatest symptoms today with supine to sit      Symptom Behavior   Type of Dizziness  --   feeling of "sickness"; "foggy"   Frequency of Dizziness  2-3x/day    Duration of Dizziness  last from seconds to longer (5-10 min)    Aggravating Factors  Turning head quickly;Supine to sit;Driving;Forward bending;Looking up to the ceiling    Relieving Factors  Rest;Head stationary      Occulomotor Exam   Occulomotor Alignment  Normal    Spontaneous  Absent    Smooth Pursuits  Intact   "a little dizzy"   Saccades  Intact   double beat  when looking R     Vestibulo-Occular Reflex   Comment  Head Impulsive with corrective saccades bilaterally;       Visual Acuity   Static  line 6    Dynamic  line 3      Positional Testing   Dix-Hallpike  Dix-Hallpike Right;Dix-Hallpike Left      Dix-Hallpike Right   Dix-Hallpike Right Symptoms  No nystagmus      Dix-Hallpike Left   Dix-Hallpike Left Symptoms  No nystagmus      Cognition   Cognition Orientation Level  Oriented x 4      Orthostatics   BP supine (x 5 minutes)  168/88    HR supine (x 5 minutes)  75    BP standing (after 1 minute)  176/83    HR standing (after 1 minute)  78    BP standing (after 3 minutes)  171/88    HR standing (after 3 minutes)  80          Objective measurements completed on examination: See above findings.              PT Education - 09/14/18 1431    Education Details  exam findings, vestibular dysfunction overview, need to contact MD regarding high BP readings today, VOR x 1 activity    Person(s) Educated  Patient    Methods  Explanation;Demonstration;Handout    Comprehension  Verbalized understanding       PT Short Term Goals - 09/14/18 1618      PT SHORT TERM GOAL #1   Title  STG = LTG        PT Long Term Goals - 09/14/18 1619      PT LONG TERM GOAL #1   Title  patient to be independent with vestibular exercises for reduced dizziness    Time  8    Period  Weeks    Status  New    Target Date  11/09/18      PT LONG TERM GOAL #2   Title  patient to report reduction in frequency, intensity, duration of dizzy symptoms    Time  8    Period  Weeks    Status  New    Target Date  11/09/18  PT LONG TERM GOAL #3   Title  patient to demonstrate DVA of 2 or less line deviation    Time  8    Period  Weeks    Status  New    Target Date  11/09/18      PT LONG TERM GOAL #4   Title  Patient to report ability to wake in the morning and drive wihtout dizzy symptoms    Time  8    Period  Weeks    Status  New     Target Date  11/09/18             Plan - 09/14/18 1432    Clinical Impression Statement  Ms. Servantes is a very pleasant 57 y/o female presenting to [facility] today [place] primary complaints of dizziness. Patient reporitng a variety of aggravating factors, but does note symtpoms daily when waking up and performing supine to sit as well as when driving. PT unable to elicit any symptoms with positional testing other than when returning to a sitting position from supine. Of note, high BP reading as compared to normal with PT educating patient on need to contact PCP of this. DVA with 3 line difference today, with normal being less than or equal to a 2 line difference. Today patient with signs and symptoms more consistent with a hypofunction vs motion sensitivity, as symptoms could not be reproduced with positional testing. Initiated VOR x 1 today with good tolerance and carryover. Patient to follow up once returned hom from travels. Patient to benefit from PT to address dizziness and associated functional limitations.     Clinical Presentation  Stable    Clinical Decision Making  Low    Rehab Potential  Good    PT Frequency  1x / week    PT Duration  4 weeks    PT Treatment/Interventions  ADLs/Self Care Home Management;Canalith Repostioning;Therapeutic exercise;Therapeutic activities;Balance training;Neuromuscular re-education;Patient/family education;Manual techniques;Vestibular;Taping;Passive range of motion;Visual/perceptual remediation/compensation    PT Next Visit Plan  reassess positional vertigo; progress VOR    Consulted and Agree with Plan of Care  Patient       Patient will benefit from skilled therapeutic intervention in order to improve the following deficits and impairments:  Dizziness, Impaired vision/preception, Decreased balance, Postural dysfunction  Visit Diagnosis: Dizziness and giddiness  Unsteadiness on feet     Problem List Patient Active Problem List   Diagnosis  Date Noted  . Vertigo 09/06/2018  . Osteoporosis 07/27/2016  . Adjustment disorder with anxiety 05/29/2015  . 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 PURE HYPERCHOLESTEROLEMIA 03/13/2009  . GERD 11/22/2008  . BASAL CELL CARCINOMA, HX OF 11/22/2008  . Hypothyroidism 01/14/2008  . ALLERGIC RHINITIS, SEASONAL 01/14/2008     Lanney Gins, PT, DPT Supplemental Physical Therapist 09/14/18 4:25 PM Pager: [phone] Office: [facility] [address] [place], [place], [address] Phone: [phone]   Fax:  [phone]  Name: KATJA BLUE MRN: [id number] Date of Birth: [birth year]

## 2018-10-03 ENCOUNTER — Other Ambulatory Visit (INDEPENDENT_AMBULATORY_CARE_PROVIDER_SITE_OTHER): Payer: Federal, State, Local not specified - PPO

## 2018-10-03 DIAGNOSIS — E038 Other specified hypothyroidism: Secondary | ICD-10-CM | POA: Diagnosis not present

## 2018-10-03 DIAGNOSIS — M810 Age-related osteoporosis without current pathological fracture: Secondary | ICD-10-CM | POA: Diagnosis not present

## 2018-10-03 DIAGNOSIS — E063 Autoimmune thyroiditis: Secondary | ICD-10-CM | POA: Diagnosis not present

## 2018-10-03 LAB — TSH: TSH: 0.08 u[IU]/mL — ABNORMAL LOW (ref 0.35–4.50)

## 2018-10-03 LAB — T4, FREE: FREE T4: 1.1 ng/dL (ref 0.60–1.60)

## 2018-10-03 LAB — VITAMIN D 25 HYDROXY (VIT D DEFICIENCY, FRACTURES): VITD: 30.35 ng/mL (ref 30.00–100.00)

## 2018-10-04 ENCOUNTER — Encounter: Payer: Self-pay | Admitting: Internal Medicine

## 2018-10-04 ENCOUNTER — Other Ambulatory Visit: Payer: Self-pay | Admitting: Internal Medicine

## 2018-10-04 ENCOUNTER — Ambulatory Visit: Payer: Federal, State, Local not specified - PPO | Admitting: Physical Therapy

## 2018-10-04 DIAGNOSIS — E063 Autoimmune thyroiditis: Principal | ICD-10-CM

## 2018-10-04 DIAGNOSIS — E038 Other specified hypothyroidism: Secondary | ICD-10-CM

## 2018-10-04 MED ORDER — SYNTHROID 50 MCG PO TABS: 75.0000 ug | ORAL_TABLET | Freq: Every day | ORAL | 5 refills | Status: DC

## 2018-10-27 DIAGNOSIS — K080 Exfoliation of teeth due to systemic causes: Secondary | ICD-10-CM | POA: Diagnosis not present

## 2018-11-21 ENCOUNTER — Other Ambulatory Visit (INDEPENDENT_AMBULATORY_CARE_PROVIDER_SITE_OTHER): Payer: Federal, State, Local not specified - PPO

## 2018-11-21 DIAGNOSIS — M810 Age-related osteoporosis without current pathological fracture: Secondary | ICD-10-CM

## 2018-11-21 DIAGNOSIS — E038 Other specified hypothyroidism: Secondary | ICD-10-CM | POA: Diagnosis not present

## 2018-11-21 DIAGNOSIS — E063 Autoimmune thyroiditis: Secondary | ICD-10-CM | POA: Diagnosis not present

## 2018-11-21 LAB — TSH: TSH: 0.48 u[IU]/mL (ref 0.35–4.50)

## 2018-11-21 LAB — T4, FREE: FREE T4: 0.8 ng/dL (ref 0.60–1.60)

## 2018-11-22 LAB — VITAMIN D 25 HYDROXY (VIT D DEFICIENCY, FRACTURES): VITD: 26.82 ng/mL — ABNORMAL LOW (ref 30.00–100.00)

## 2018-11-22 LAB — THYROID PEROXIDASE ANTIBODY: THYROID PEROXIDASE ANTIBODY: 61 [IU]/mL — AB (ref 0–34)

## 2018-11-22 LAB — SPECIMEN STATUS REPORT

## 2018-11-22 LAB — THYROGLOBULIN ANTIBODY: THYROGLOBULIN AB: 9 [IU]/mL — AB (ref <=1)

## 2018-12-02 ENCOUNTER — Other Ambulatory Visit: Payer: Self-pay | Admitting: Family Medicine

## 2018-12-11 ENCOUNTER — Other Ambulatory Visit: Payer: Self-pay | Admitting: Family Medicine

## 2018-12-26 ENCOUNTER — Other Ambulatory Visit: Payer: Self-pay | Admitting: Family Medicine

## 2018-12-29 ENCOUNTER — Ambulatory Visit: Payer: Federal, State, Local not specified - PPO | Admitting: Internal Medicine

## 2018-12-29 ENCOUNTER — Encounter: Payer: Self-pay | Admitting: Internal Medicine

## 2018-12-29 VITALS — BP 120/60 | HR 80 | Ht 63.0 in | Wt 131.0 lb

## 2018-12-29 DIAGNOSIS — E063 Autoimmune thyroiditis: Secondary | ICD-10-CM

## 2018-12-29 DIAGNOSIS — E038 Other specified hypothyroidism: Secondary | ICD-10-CM

## 2018-12-29 DIAGNOSIS — M810 Age-related osteoporosis without current pathological fracture: Secondary | ICD-10-CM | POA: Diagnosis not present

## 2018-12-29 DIAGNOSIS — E559 Vitamin D deficiency, unspecified: Secondary | ICD-10-CM | POA: Diagnosis not present

## 2018-12-29 LAB — T4, FREE: Free T4: 1 ng/dL (ref 0.60–1.60)

## 2018-12-29 LAB — TSH: TSH: 0.49 u[IU]/mL (ref 0.35–4.50)

## 2018-12-29 MED ORDER — SYNTHROID 50 MCG PO TABS: 75.0000 ug | ORAL_TABLET | Freq: Every day | ORAL | 3 refills | Status: DC

## 2018-12-29 NOTE — Patient Instructions (Addendum)
Please stop at the lab.  Please continue Synthroid 75 mcg daily.  Take the thyroid hormone every day, with water, at least 30 minutes before breakfast, separated by at least 4 hours from: - acid reflux medications - calcium - iron - multivitamins  Please increase the vitamin D supplement to 3000 units daily and continue Multivitamin.  Please come back for a follow-up appointment in 6 months.

## 2018-12-29 NOTE — Progress Notes (Signed)
Patient ID: Nicole Henry, female   DOB: 1961-03-25, 58 y.o.   MRN: [id number]    HPI  Nicole Henry is a 58 y.o.-year-old female, returning for follow-up for uncontrolled Hashimoto's hypothyroidism, vitamin D deficiency, osteoporosis.   Pt. has been dx with hypothyroidism in ~2009 (fatigue, TSH 16) >> started on Synthroid d.a.w. 50 mcg daily (last dose decreased in 02/2018: 5/7 75 mcg, 2/7 50 mcg). She cannot take the LT4 with dyes.  She is currently on Synthroid d.a.w. 75 (1.5 tabs of 50 mcg) mcg daily changed at last visit as a TSH was very high.  At that time, her TPO and ATA antibodies were also high, confirming Hashimoto's thyroiditis.  She takes the thyroid hormone: - in am - fasting, separated by coffee and cream by 30 minutes at least - at least 30 min from b'fast - no Ca, Fe - + Multivitamins moved in the evening (at last visit she was taken them 1 hour after levothyroxine) - + Omeprazole moved later in the day (at last visit she was occasionally taking it in the morning) - not on Biotin  Reviewed patient's TFTs-TSH normalized at last check: Lab Results  Component Value Date   TSH 0.48 11/21/2018   TSH 0.08 (L) 10/03/2018   TSH 32.97 (H) 06/27/2018   TSH 0.10 (L) 03/08/2018   TSH 6.56 (H) 01/20/2018   TSH 8.81 (H) 11/09/2017   TSH 5.95 (H) 09/15/2017   TSH 0.55 07/21/2016   TSH 6.56 (H) 07/04/2015   TSH 0.71 04/30/2015   FREET4 0.80 11/21/2018   FREET4 1.10 10/03/2018   FREET4 0.59 (L) 06/27/2018   FREET4 0.84 03/08/2018   FREET4 0.85 01/20/2018   FREET4 0.90 09/15/2017   FREET4 1.00 07/21/2016   FREET4 0.84 07/04/2015   FREET4 1.22 04/30/2015   FREET4 1.12 08/24/2014   T3FREE 2.8 03/08/2018   T3FREE 2.7 07/25/2014   Antithyroid antibodies: Lab Results  Component Value Date   THGAB 9 (H) 11/21/2018   THGAB 9 (H) 06/27/2018   Component     Latest Ref Rng & Units 06/27/2018 11/21/2018  Thyroperoxidase Ab SerPl-aCnc     0 - 34 IU/mL 127 (H) 61 (H)   Pt  denies: - feeling nodules in neck - hoarseness - dysphagia - choking - SOB with lying down  She has no FH of thyroid disorders. No FH of thyroid cancer. No h/o radiation tx to head or neck.  No herbal supplements. No Biotin use. No recent steroids use.   She also has vitamin D deficiency: Lab Results  Component Value Date   VD25OH 26.82 (L) 11/21/2018   VD25OH 30.35 10/03/2018   VD25OH 23.38 (L) 06/27/2018   We started 2000 units vitamin D daily- at last check in 11/2018 level was better but still slightly low.  She tells me that she is now only taking 1000 units vitamin D from a supplement and the rest from the multivitamin.  Pt. also has a history of osteoporosis:  Reviewed previous bone density report: DXA (09/01/2018): RFN 09/01/2018     -2.4  RFN 06/29/2016    -2.5  LFN 09/01/2018      -2.3 LFN 06/29/2016       -2.2  AP Spine L1-L4   09/01/2018  56.8     -1.4  1.015 g/cm2 (+1%) AP Spine L1-L4   06/29/2016  54.6     -1.5  1.005 g/cm2  DualFemur Total Mean 09/01/2018  56.8     -1.3  0.838  g/cm2 DualFemur Total Mean 06/29/2016  54.6     -1.5  0.824 g/cm2  FRAX: Major osteoporosis fracture risk over 10 years: 9.5% Hip fracture risk over 10 years: 1.6%  Stable T-scores, in the osteopenic range.  Also, HL.  ROS: Constitutional: no weight gain/no weight loss, + fatigue, no subjective hyperthermia, no subjective hypothermia Eyes: no blurry vision, no xerophthalmia ENT: no sore throat,+ see HPI Cardiovascular: no CP/no SOB/no palpitations/no leg swelling Respiratory: no cough/no SOB/no wheezing Gastrointestinal: no N/no V/no D/no C/no acid reflux Musculoskeletal: no muscle aches/no joint aches Skin: no rashes, no hair loss Neurological: no tremors/no numbness/no tingling/no dizziness  I reviewed pt's medications, allergies, PMH, social hx, family hx, and changes were documented in the history of present illness.  Otherwise, unchanged from my initial visit note.  Past Medical History:  Diagnosis Date  . Allergic rhinitis due to pollen   . Basal cell carcinoma    history of  . Family history of diabetes mellitus   . GERD (gastroesophageal reflux disease)   . Hypothyroidism   . Pure hypercholesterolemia   . Rosacea   . Screening for lipoid disorders    Past Surgical History:  Procedure Laterality Date  . AUGMENTATION MAMMAPLASTY Bilateral   . BREAST ENHANCEMENT SURGERY  1997   Social History   Socioeconomic History  . Marital status: Married    Spouse name: Not on file  . Number of children: 2  . Years of education: Not on file  . Highest education level: Not on file  Occupational History    Comment: works for [employer]  . Financial resource strain: Not on file  . Food insecurity:    Worry: Not on file    Inability: Not on file  . Transportation needs:    Medical: Not on file    Non-medical: Not on file  Tobacco Use  . Smoking status: Never Smoker  . Smokeless tobacco: Never Used  Substance and Sexual Activity  . Alcohol use: No  . Drug use: No  . Sexual activity: Not on file  Lifestyle  . Physical activity:    Days per week: Not on file    Minutes per session: Not on file  . Stress: Not on file  Relationships  . Social connections:    Talks on phone: Not on file    Gets together: Not on file    Attends religious service: Not on file    Active member of club or organization: Not on file    Attends meetings of clubs or organizations: Not on file    Relationship status: Not on file  . Intimate partner violence:    Fear of current or ex partner: Not on file    Emotionally abused: Not on file    Physically abused: Not on file    Forced sexual activity: Not on file  Other Topics Concern  . Not on file  Social History Narrative   Regular exercise- yes, running 2 to 3 times a week.   Diet- fruits and veggies.   Current Outpatient  Medications on File Prior to Visit  Medication Sig Dispense Refill  . hydrochlorothiazide (MICROZIDE) 12.5 MG capsule TAKE 1 CAPSULE(12.5 MG) BY MOUTH DAILY 90 capsule 0  . meclizine (ANTIVERT) 12.5 MG tablet Take 1 tablet (12.5 mg total) by mouth 3 (three) times daily as needed for dizziness. 30 tablet 0  . pravastatin (PRAVACHOL) 20 MG tablet Take 1 tablet (20 mg total) by mouth  daily. 90 tablet 1  . sertraline (ZOLOFT) 50 MG tablet TAKE 1 TABLET BY MOUTH EVERY DAY 30 tablet 0  . SYNTHROID 50 MCG tablet Take 1.5 tablets (75 mcg total) by mouth daily before breakfast. 120 tablet 5   No current facility-administered medications on file prior to visit.    Allergies  Allergen Reactions  . Ciprofloxacin   . Nitrofurantoin Other (See Comments)    unknown  . Simvastatin     myaglias  . Penicillins Rash  . Sulfonamide Derivatives Rash  . Yellow Dyes (Non-Tartrazine) Rash   Family History  Problem Relation Age of Onset  . Hypertension Father   . Prostate cancer Father   . Heart disease Brother        ? heart issue  . Heart attack Paternal Grandfather        late age    PE: BP 120/60   Pulse 80   Ht 5\' 3"  (1.6 m)   Wt 131 lb (59.4 kg)   SpO2 99%   BMI 23.21 kg/m  Wt Readings from Last 3 Encounters:  12/29/18 131 lb (59.4 kg)  09/06/18 135 lb 9.6 oz (61.5 kg)  06/27/18 133 lb 3.2 oz (60.4 kg)   Constitutional: normalweight, in NAD Eyes: PERRLA, EOMI, no exophthalmos ENT: moist mucous membranes, no thyromegaly, no cervical lymphadenopathy Cardiovascular: RRR, No MRG Respiratory: CTA B Gastrointestinal: abdomen soft, NT, ND, BS+ Musculoskeletal: no deformities, strength intact in all 4 Skin: moist, warm, no rashes Neurological: no tremor with outstretched hands, DTR normal in all 4  ASSESSMENT: 1. Hypothyroidism  2. Hashimoto's thyroiditis  3. Osteoporosis   4. Vit D def  PLAN:  1. And 2.  Patient with long-standing hypothyroidism, on levothyroxine therapy -  latest thyroid labs reviewed with pt >> normal.  Also, her thyroid antibodies improved at last check in 11/2018.  We discussed that this represents improvement in the activity of her Hashimoto's thyroiditis. - she continues on LT4 75 mcg daily (1.5 tablets of 50 mcg dose) - pt feels good on this dose. - we discussed about taking the thyroid hormone every day, with water, >30 minutes before breakfast, separated by >4 hours from acid reflux medications, calcium, iron, multivitamins. Pt. is taking it correctly now, in the past she was not separating eating out from multivitamins and PPIs. - will check thyroid tests today: TSH and fT4 - If labs are abnormal, she will need to return for repeat TFTs in 1.5 months  3. Osteoporosis  -Reviewed previous bone density reports: At last check the T-scores were stable or slightly improved. -For now, we will continue with weightbearing exercises and also normalizing her vitamin D intake -At last visit I also suggested improvement in her diet-low acid eating. -Also at last visit we discussed about the different medication classes, benefits and side effects (including atypical fractures and ONJ).  She was telling me that she did discuss with her dentist about this and he strongly discouraged the use. -We will plan to obtain another bone density scan in 2 years from the previous  4.  Vitamin D deficiency -Latest level was still low on 1000 units vitamin D daily + multivitamin. -We will increase the dose to 3000 units + multivitamin daily and recheck vitamin D level at next visit.  Needs refills for next 6 mo.  Office Visit on 12/29/2018  Component Date Value Ref Range Status  . TSH 12/29/2018 0.49  0.35 - 4.50 uIU/mL Final  . Free T4 12/29/2018 1.00  0.60 - 1.60 ng/dL Final   Comment: Specimens from patients who are undergoing biotin therapy and /or ingesting biotin supplements may contain high levels of biotin.  The higher biotin concentration in these  specimens interferes with this Free T4 assay.  Specimens that contain high levels  of biotin may cause false high results for this Free T4 assay.  Please interpret results in light of the total clinical presentation of the patient.     Normal TFTs.  Philemon Kingdom, MD PhD [facility]

## 2019-01-24 ENCOUNTER — Other Ambulatory Visit: Payer: Self-pay | Admitting: Family Medicine

## 2019-02-01 ENCOUNTER — Ambulatory Visit: Payer: Self-pay | Admitting: *Deleted

## 2019-02-01 ENCOUNTER — Other Ambulatory Visit: Payer: Self-pay

## 2019-02-01 MED ORDER — OSELTAMIVIR PHOSPHATE 75 MG PO CAPS: 75.0000 mg | ORAL_CAPSULE | Freq: Two times a day (BID) | ORAL | 0 refills | Status: AC

## 2019-02-01 NOTE — Telephone Encounter (Signed)
Pt aware Rx is at pharmacy/advised to call if not better per TA/thx dmf

## 2019-02-01 NOTE — Telephone Encounter (Signed)
Attempted to contact pt; left message on voicemail [phone].

## 2019-02-01 NOTE — Telephone Encounter (Signed)
Pt called in c/o flu like symptoms.   Her husband was diagnosed with the flu last Thursday.   She is requesting an anti-viral without having to come in if possible.    She lives in [place].   These symptoms came on suddenly yesterday afternoon.  See triage notes.  I spoke with the flow coordinator and she is going to check with Dr. Deborra Medina and call the pt back. The pt was agreeable to this plan.  I sent these triage notes to Dr. Hulen Shouts nurse pool.  Reason for Disposition . [1] Patient is NOT HIGH RISK AND [2] strongly requests antiviral medicine AND [3] flu symptoms present < 48 hours  Answer Assessment - Initial Assessment Questions 1. WORST SYMPTOM: "What is your worst symptom?" (e.g., cough, runny nose, muscle aches, headache, sore throat, fever)      A bad sore throat, dry cough, fever 101-102.   I was using Tylenol which has helped the fever.   I ache all over.   I have no appetite at all.    2. ONSET: "When did your flu symptoms start?"      Started late yesterday afternoon.    My husband has the flu since Thursday.   I have the same symptoms.    I had the flu shot. 3. COUGH: "How bad is the cough?"       Dry cough and painful cough. 4. RESPIRATORY DISTRESS: "Describe your breathing."      No shortness of breath 5. FEVER: "Do you have a fever?" If so, ask: "What is your temperature, how was it measured, and when did it start?"     See above 6. EXPOSURE: "Were you exposed to someone with influenza?"       Husband has the flu.    People at my work place have had the flu. 7. FLU VACCINE: "Did you get a flu shot this year?"     Yes 8. HIGH RISK DISEASE: "Do you any chronic medical problems?" (e.g., heart or lung disease, asthma, weak immune system, or other HIGH RISK conditions)     No     9. PREGNANCY: "Is there any chance you are pregnant?" "When was your last menstrual period?"     Not asked due to age 58. OTHER SYMPTOMS: "Do you have any other symptoms?"  (e.g., runny nose,  muscle aches, headache, sore throat)       No nasal congestion.   Bad headache.   See above also.  Protocols used: INFLUENZA - SEASONAL-A-AH

## 2019-02-03 ENCOUNTER — Encounter: Payer: Self-pay | Admitting: Family Medicine

## 2019-02-03 ENCOUNTER — Ambulatory Visit: Payer: Federal, State, Local not specified - PPO | Admitting: Family Medicine

## 2019-02-03 DIAGNOSIS — J111 Influenza due to unidentified influenza virus with other respiratory manifestations: Secondary | ICD-10-CM | POA: Insufficient documentation

## 2019-02-03 DIAGNOSIS — R69 Illness, unspecified: Secondary | ICD-10-CM | POA: Diagnosis not present

## 2019-02-03 MED ORDER — BENZONATATE 150 MG PO CAPS: 1.0000 | ORAL_CAPSULE | Freq: Three times a day (TID) | ORAL | 0 refills | Status: DC | PRN

## 2019-02-03 NOTE — Progress Notes (Signed)
Nicole Henry - 58 y.o. female MRN [id number]  Date of birth: Apr 15, 1961  Subjective Chief Complaint  Patient presents with  . Cough    Productive-has been taking Tamiflu since 2/19-still having body aches and sore throat.     HPI Nicole Henry is a 58 y.o. female here today for follow up of flu like illness.  She called in 2/19 and received an rx for tamiflu.  She has been taking tamiflu x2 days but is concerned about continued aches, cough and sore throat.  She does feel fatigued.  She denies shortness of breath, wheezing, chills, nausea or vomiting.  In addition to tamiflu she is taking OTC cough medication but isn't getting much relief with this.   ROS:  A comprehensive ROS was completed and negative except as noted per HPI    Allergies  Allergen Reactions  . Ciprofloxacin   . Nitrofurantoin Other (See Comments)    unknown  . Simvastatin     myaglias  . Penicillins Rash  . Sulfonamide Derivatives Rash  . Yellow Dyes (Non-Tartrazine) Rash    Past Medical History:  Diagnosis Date  . Allergic rhinitis due to pollen   . Basal cell carcinoma    history of  . Family history of diabetes mellitus   . GERD (gastroesophageal reflux disease)   . Hypothyroidism   . Pure hypercholesterolemia   . Rosacea   . Screening for lipoid disorders     Past Surgical History:  Procedure Laterality Date  . AUGMENTATION MAMMAPLASTY Bilateral   . BREAST ENHANCEMENT SURGERY  1997    Social History   Socioeconomic History  . Marital status: Married    Spouse name: Not on file  . Number of children: 2  . Years of education: Not on file  . Highest education level: Not on file  Occupational History    Comment: works for housing and [employer]  . Financial resource strain: Not on file  . Food insecurity:    Worry: Not on file    Inability: Not on file  . Transportation needs:    Medical: Not on file    Non-medical: Not on file  Tobacco Use  . Smoking  status: Never Smoker  . Smokeless tobacco: Never Used  Substance and Sexual Activity  . Alcohol use: No  . Drug use: No  . Sexual activity: Not on file  Lifestyle  . Physical activity:    Days per week: Not on file    Minutes per session: Not on file  . Stress: Not on file  Relationships  . Social connections:    Talks on phone: Not on file    Gets together: Not on file    Attends religious service: Not on file    Active member of club or organization: Not on file    Attends meetings of clubs or organizations: Not on file    Relationship status: Not on file  Other Topics Concern  . Not on file  Social History Narrative   Regular exercise- yes, running 2 to 3 times a week.   Diet- fruits and veggies.    Family History  Problem Relation Age of Onset  . Hypertension Father   . Prostate cancer Father   . Heart disease Brother        ? heart issue  . Heart attack Paternal Grandfather        late age    Health Maintenance  Topic Date Due  .  MAMMOGRAM  02/05/2020  . PAP SMEAR-Modifier  05/14/2020  . COLONOSCOPY  06/17/2022  . TETANUS/TDAP  01/21/2028  . INFLUENZA VACCINE  Completed  . Hepatitis C Screening  Completed  . HIV Screening  Discontinued    ----------------------------------------------------------------------------------------------------------------------------------------------------------------------------------------------------------------- Physical Exam BP 121/78   Pulse 93   Temp 99.1 F (37.3 C) (Oral)   Ht 5\' 3"  (1.6 m)   Wt 131 lb (59.4 kg)   SpO2 95%   BMI 23.21 kg/m   Physical Exam Constitutional:      Appearance: Normal appearance. She is not toxic-appearing.  HENT:     Head: Normocephalic and atraumatic.     Right Ear: Tympanic membrane normal.     Left Ear: Tympanic membrane normal.     Mouth/Throat:     Mouth: Mucous membranes are moist.  Eyes:     General: No scleral icterus. Neck:     Musculoskeletal: Neck supple.    Cardiovascular:     Rate and Rhythm: Normal rate and regular rhythm.  Pulmonary:     Effort: Pulmonary effort is normal.     Breath sounds: Normal breath sounds. No wheezing or rhonchi.  Skin:    General: Skin is warm and dry.  Neurological:     General: No focal deficit present.     Mental Status: She is alert and oriented to person, place, and time.  Psychiatric:        Mood and Affect: Mood normal.        Behavior: Behavior normal.     ------------------------------------------------------------------------------------------------------------------------------------------------------------------------------------------------------------------- Assessment and Plan  Influenza-like illness -Given symptoms I recommended that she complete tamiflu -Discussed that tamiflu will not make her feel better instantly and that it typically will decreased flu symptoms by about 1 day -Continue supportive care. Push fluids.   Will add on benzonatate, requested dye free if available.   -Recommend RTC for any continued worsening of symptoms.

## 2019-02-03 NOTE — Assessment & Plan Note (Addendum)
-  Given symptoms I recommended that she complete tamiflu -Discussed that tamiflu will not make her feel better instantly and that it typically will decreased flu symptoms by about 1 day -Continue supportive care. Push fluids.   Will add on benzonatate, requested dye free if available.   -Recommend RTC for any continued worsening of symptoms.

## 2019-02-03 NOTE — Patient Instructions (Signed)

## 2019-02-20 ENCOUNTER — Encounter (HOSPITAL_COMMUNITY): Payer: Self-pay | Admitting: Internal Medicine

## 2019-02-20 ENCOUNTER — Ambulatory Visit (HOSPITAL_COMMUNITY)
Admission: RE | Admit: 2019-02-20 | Discharge: 2019-02-20 | Disposition: A | Discharge status: Home / Self Care | Payer: Federal, State, Local not specified - PPO | Source: Ambulatory Visit | Attending: Internal Medicine | Admitting: Internal Medicine

## 2019-02-20 VITALS — BP 142/86 | HR 99 | Wt 133.6 lb

## 2019-02-20 DIAGNOSIS — Z789 Other specified health status: Secondary | ICD-10-CM

## 2019-02-20 DIAGNOSIS — Z888 Allergy status to other drugs, medicaments and biological substances status: Secondary | ICD-10-CM | POA: Diagnosis not present

## 2019-02-20 DIAGNOSIS — Z79899 Other long term (current) drug therapy: Secondary | ICD-10-CM | POA: Insufficient documentation

## 2019-02-20 DIAGNOSIS — Z882 Allergy status to sulfonamides status: Secondary | ICD-10-CM | POA: Diagnosis not present

## 2019-02-20 DIAGNOSIS — E7800 Pure hypercholesterolemia, unspecified: Secondary | ICD-10-CM | POA: Insufficient documentation

## 2019-02-20 DIAGNOSIS — Z85828 Personal history of other malignant neoplasm of skin: Secondary | ICD-10-CM | POA: Diagnosis not present

## 2019-02-20 DIAGNOSIS — Z79890 Hormone replacement therapy (postmenopausal): Secondary | ICD-10-CM | POA: Insufficient documentation

## 2019-02-20 DIAGNOSIS — Z880 Allergy status to penicillin: Secondary | ICD-10-CM | POA: Insufficient documentation

## 2019-02-20 DIAGNOSIS — Z833 Family history of diabetes mellitus: Secondary | ICD-10-CM | POA: Insufficient documentation

## 2019-02-20 DIAGNOSIS — Z881 Allergy status to other antibiotic agents status: Secondary | ICD-10-CM | POA: Insufficient documentation

## 2019-02-20 DIAGNOSIS — Z8249 Family history of ischemic heart disease and other diseases of the circulatory system: Secondary | ICD-10-CM | POA: Diagnosis not present

## 2019-02-20 DIAGNOSIS — E039 Hypothyroidism, unspecified: Secondary | ICD-10-CM | POA: Insufficient documentation

## 2019-02-20 LAB — BASIC METABOLIC PANEL
Anion gap: 8 (ref 5–15)
BUN: 10 mg/dL (ref 6–20)
CO2: 28 mmol/L (ref 22–32)
CREATININE: 0.73 mg/dL (ref 0.44–1.00)
Calcium: 9 mg/dL (ref 8.9–10.3)
Chloride: 103 mmol/L (ref 98–111)
GFR calc non Af Amer: >60 mL/min (ref >60)
Glucose, Bld: 98 mg/dL (ref 70–99)
Potassium: 3.7 mmol/L (ref 3.5–5.1)
SODIUM: 139 mmol/L (ref 135–145)

## 2019-02-20 NOTE — Patient Instructions (Signed)
Your physician has requested that you have a cardiac MRI. Cardiac MRI uses a computer to create images of your heart as its beating, producing both still and moving pictures of your heart and major blood vessels. For further information please visit [email or web address] Please follow the instruction sheet given to you today for more information.  ONCE THIS IS APPROVED WITH YOUR INSURANCE COMPANY YOU WILL BE CONTACTED TO SCHEDULE THE TEST.

## 2019-02-20 NOTE — Progress Notes (Signed)
ADVANCED HF CLINIC CONSULT NOTE  Referring Physician: Dr. Deborra Medina   HPI:  Tiny Chaudhary is a 58 y/o woman with a FHx of non-compaction CM who is referred for family screening for ARVC.   In 2006 her brother was diagnosed with systolic HF due to severe NICM with an EF 8%. He ahs been followed by Dr. Fletcher Anon and his EF has fortunately improved to 40% with aggressive medical therapy. In 8/19, her nephew (her brother's son) developed persistent upper respiratory symptoms. Eventually found to have severe systolic HF and cardiogenic shock at [facility]. Was transported to [facility] and placed on ECMO but unfortunately they were unable to save him.   They have subsequently been seen by Dr. Broadus John for genetic screening (results not back yet) and have been advised to have first-degree relatives screened for NICM with an emphasis on ARVC.   In 2008 had episode of SOB and had echo EF 50-55% with question of late positive bubble study.   In 2016 had episode of SOB while hiking. Got a stress echo which was normal.   Since that time has been doing very well. Active hiking  And goes to the gym without problem. No syncope or presyncope. No palpitations    Review of Systems: [y] = yes, [ ]  = no   General: Weight gain [ ] ; Weight loss [ ] ; Anorexia [ ] ; Fatigue [ ] ; Fever [ ] ; Chills [ ] ; Weakness [ ]   Cardiac: Chest pain/pressure [ ] ; Resting SOB [ ] ; Exertional SOB [ ] ; Orthopnea [ ] ; Pedal Edema [ ] ; Palpitations [ ] ; Syncope [ ] ; Presyncope [ ] ; Paroxysmal nocturnal dyspnea[ ]   Pulmonary: Cough [ ] ; Wheezing[ ] ; Hemoptysis[ ] ; Sputum [ ] ; Snoring [ ]   GI: Vomiting[ ] ; Dysphagia[ ] ; Melena[ ] ; Hematochezia [ ] ; Heartburn[ ] ; Abdominal pain [ ] ; Constipation [ ] ; Diarrhea [ ] ; BRBPR [ ]   GU: Hematuria[ ] ; Dysuria [ ] ; Nocturia[ ]   Vascular: Pain in legs with walking [ ] ; Pain in feet with lying flat [ ] ; Non-healing sores [ ] ; Stroke [ ] ; TIA [ ] ; Slurred speech [ ] ;  Neuro: Headaches[ ] ;  Vertigo[ ] ; Seizures[ ] ; Paresthesias[ ] ;Blurred vision [ ] ; Diplopia [ ] ; Vision changes [ ]   Ortho/Skin: Arthritis [ y]; Joint pain Blue.Reese ]; Muscle pain [ ] ; Joint swelling [ ] ; Back Pain [ ] ; Rash [ ]   Psych: Depression[y ]; Anxiety[ ]   Heme: Bleeding problems [ ] ; Clotting disorders [ ] ; Anemia [ ]   Endocrine: Diabetes [ ] ; Thyroid dysfunction[ y]   Past Medical History:  Diagnosis Date  . Allergic rhinitis due to pollen   . Basal cell carcinoma    history of  . Family history of diabetes mellitus   . GERD (gastroesophageal reflux disease)   . Hypothyroidism   . Pure hypercholesterolemia   . Rosacea   . Screening for lipoid disorders     Current Outpatient Medications  Medication Sig Dispense Refill  . acetaminophen (TYLENOL 8 HOUR ARTHRITIS PAIN) 650 MG CR tablet Take 650 mg by mouth every 8 (eight) hours as needed for pain.    . hydrochlorothiazide (MICROZIDE) 12.5 MG capsule TAKE 1 CAPSULE(12.5 MG) BY MOUTH DAILY 90 capsule 0  . sertraline (ZOLOFT) 50 MG tablet Take 50 mg by mouth daily.    Marland Kitchen SYNTHROID 50 MCG tablet Take 1.5 tablets (75 mcg total) by mouth daily before breakfast. 150 tablet 3   No current facility-administered medications for this encounter.  Allergies  Allergen Reactions  . Ciprofloxacin   . Nitrofurantoin Other (See Comments)    unknown  . Simvastatin     myaglias  . Penicillins Rash  . Sulfonamide Derivatives Rash  . Yellow Dyes (Non-Tartrazine) Rash      Social History   Socioeconomic History  . Marital status: Married    Spouse name: Not on file  . Number of children: 2  . Years of education: Not on file  . 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 Financial resource strain: Not on file  . Food insecurity:    Worry: Not on file    Inability: Not on file  . Transportation needs:    Medical: Not on file    Non-medical: Not on file  Tobacco Use    . Smoking status: Never Smoker  . Smokeless tobacco: Never Used  Substance and Sexual Activity  . Alcohol use: No  . Drug use: No  . Sexual activity: Not on file  Lifestyle  . Physical activity:    Days per week: Not on file    Minutes per session: Not on file  . Stress: Not on file  Relationships  . Social connections:    Talks on phone: Not on file    Gets together: Not on file    Attends religious service: Not on file    Active member of club or organization: Not on file    Attends meetings of clubs or organizations: Not on file    Relationship status: Not on file  . Intimate partner violence:    Fear of current or ex partner: Not on file    Emotionally abused: Not on file    Physically abused: Not on file    Forced sexual activity: Not on file  Other Topics Concern  . Not on file  Social History Narrative   Regular exercise- yes, running 2 to 3 times a week.   Diet- fruits and veggies.      Family History  Problem Relation Age of Onset  . Hypertension Father   . Prostate cancer Father   . Heart disease Brother        ? heart issue  . Heart attack Paternal Grandfather        late age    63:   02/20/19 1140  BP: (!) 142/86  Pulse: 99  SpO2: 99%  Weight: 60.6 kg (133 lb 9.6 oz)    PHYSICAL EXAM: General:  Well appearing. No respiratory difficulty HEENT: normal Neck: supple. no JVD. Carotids 2+ bilat; no bruits. No lymphadenopathy or thryomegaly appreciated. Cor: PMI nondisplaced. Regular rate & rhythm. No rubs, gallops or murmurs. Lungs: clear Abdomen: soft, nontender, nondistended. No hepatosplenomegaly. No bruits or masses. Good bowel sounds. Extremities: no cyanosis, clubbing, rash, edema Neuro: alert & oriented x 3, cranial nerves grossly intact. moves all 4 extremities w/o difficulty. Affect pleasant.  ECG: NSR 83 No ST-T wave abnormalities. Personally reviewed   ASSESSMENT & PLAN:  1. Family h/o ARVC cardiomyopathy  - will plan cMRI to  further evaluate.  - family currently being screened by [facility]  Glori Bickers, MD  12:02 PM

## 2019-03-04 ENCOUNTER — Other Ambulatory Visit: Payer: Self-pay | Admitting: Family Medicine

## 2019-03-27 ENCOUNTER — Other Ambulatory Visit: Payer: Self-pay | Admitting: Family Medicine

## 2019-03-28 ENCOUNTER — Other Ambulatory Visit: Payer: Self-pay | Admitting: Family Medicine

## 2019-03-30 ENCOUNTER — Encounter: Payer: Self-pay | Admitting: Family Medicine

## 2019-03-30 ENCOUNTER — Ambulatory Visit (INDEPENDENT_AMBULATORY_CARE_PROVIDER_SITE_OTHER): Payer: Federal, State, Local not specified - PPO | Admitting: Family Medicine

## 2019-03-30 DIAGNOSIS — I10 Essential (primary) hypertension: Secondary | ICD-10-CM | POA: Diagnosis not present

## 2019-03-30 DIAGNOSIS — F4322 Adjustment disorder with anxiety: Secondary | ICD-10-CM

## 2019-03-30 MED ORDER — SERTRALINE HCL 50 MG PO TABS: ORAL_TABLET | ORAL | 2 refills | Status: DC

## 2019-03-30 MED ORDER — HYDROCHLOROTHIAZIDE 12.5 MG PO CAPS: ORAL_CAPSULE | ORAL | 2 refills | Status: DC

## 2019-03-30 NOTE — Assessment & Plan Note (Signed)
>  15 minutes spent in face to face time with patient, >50% spent in counselling or coordination of care. She feels current rxs as working well.  eRx refills sent.

## 2019-03-30 NOTE — Progress Notes (Signed)
Virtual Visit via Video   I connected with Nicole Henry on 03/30/19 at 11:00 AM EDT by a video enabled telemedicine application and verified that I am speaking with the correct person using two identifiers. Location patient: Home Location provider: [facility], Office Persons participating in the virtual visit: Suzan Slick, MD   I discussed the limitations of evaluation and management by telemedicine and the availability of in person appointments. The patient expressed understanding and agreed to proceed.  Subjective:   HPI:   HTN- has been well controlled on HCTZ 12.5 mg daily. Denies HA, blurred vision, CP or SOB.  Lab Results  Component Value Date   CREATININE 0.73 02/20/2019   Depression- well controlled with zoloft 50 mg daily.  Depression screen [facility] 2/9 03/30/2019 09/15/2017  Decreased Interest 0 0  Down, Depressed, Hopeless 0 0  PHQ - 2 Score 0 0   GAD 7 : Generalized Anxiety Score 03/30/2019  Nervous, Anxious, on Edge 0  Control/stop worrying 0  Worry too much - different things 0  Trouble relaxing 0  Restless 0  Easily annoyed or irritable 0  Afraid - awful might happen 0  Total GAD 7 Score 0  Anxiety Difficulty Not difficult at all     BP Readings from Last 3 Encounters:  02/20/19 (!) 142/86  02/03/19 121/78  12/29/18 120/60   Review of Systems  Constitutional: Negative.   HENT: Negative.   Eyes: Negative.   Respiratory: Negative.   Cardiovascular: Negative.   Gastrointestinal: Negative.   Endocrine: Negative.   Genitourinary: Negative.   Musculoskeletal: Negative.   Skin: Negative.   Allergic/Immunologic: Negative.   Neurological: Negative.   Hematological: Negative.   Psychiatric/Behavioral: Negative.   All other systems reviewed and are negative.   ROS: See pertinent positives and negatives per HPI.  Patient Active Problem List   Diagnosis Date Noted  . Influenza-like illness 02/03/2019  . Vertigo 09/06/2018  .  Osteoporosis 07/27/2016  . Adjustment disorder with anxiety 05/29/2015  . 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 PURE HYPERCHOLESTEROLEMIA 03/13/2009  . GERD 11/22/2008  . BASAL CELL CARCINOMA, HX OF 11/22/2008  . Hypothyroidism 01/14/2008  . ALLERGIC RHINITIS, SEASONAL 01/14/2008    Social History   Tobacco Use  . Smoking status: Never Smoker  . Smokeless tobacco: Never Used  Substance Use Topics  . Alcohol use: No    Current Outpatient Medications:  .  acetaminophen (TYLENOL 8 HOUR ARTHRITIS PAIN) 650 MG CR tablet, Take 650 mg by mouth every 8 (eight) hours as needed for pain., Disp: , Rfl:  .  hydrochlorothiazide (MICROZIDE) 12.5 MG capsule, TAKE 1 CAPSULE(12.5 MG) BY MOUTH DAILY. PLEASE SCHEDULE A VIRTUAL VISIT., Disp: 90 capsule, Rfl: 0 .  sertraline (ZOLOFT) 50 MG tablet, TAKE 1 TABLET BY MOUTH EVERY DAY(NEED APPT FOR MORE REFILLS), Disp: 30 tablet, Rfl: 0 .  SYNTHROID 50 MCG tablet, Take 1.5 tablets (75 mcg total) by mouth daily before breakfast., Disp: 150 tablet, Rfl: 3  Allergies  Allergen Reactions  . Ciprofloxacin   . Nitrofurantoin Other (See Comments)    unknown  . Simvastatin     myaglias  . Penicillins Rash  . Sulfonamide Derivatives Rash  . Yellow Dyes (Non-Tartrazine) Rash    Objective:   There were no vitals taken for this visit.  VITALS: Per patient if applicable, see vitals. GENERAL: Alert, appears well and in no acute distress. HEENT: Atraumatic, conjunctiva clear, no obvious abnormalities on inspection of external nose and ears.  NECK: Normal movements of the head and neck. CARDIOPULMONARY: No increased WOB. Speaking in clear sentences. I:E ratio WNL.  MS: Moves all visible extremities without noticeable abnormality. PSYCH: Pleasant and cooperative, well-groomed. Speech normal rate and rhythm. Affect is appropriate. Insight and judgement are appropriate. Attention is focused, linear, and appropriate.  NEURO: CN grossly intact. Oriented as arrived to  appointment on time with no prompting. Moves both UE equally.  SKIN: No obvious lesions, wounds, erythema, or cyanosis noted on face or hands.  Assessment and Plan:   There are no diagnoses linked to this encounter.  . Reviewed expectations re: course of current medical issues. . Discussed self-management of symptoms. . Outlined signs and symptoms indicating need for more acute intervention. . Patient verbalized understanding and all questions were answered. Marland Kitchen Health Maintenance issues including appropriate healthy diet, exercise, and smoking avoidance were discussed with patient. . See orders for this visit as documented in the electronic medical record.  Arnette Norris, MD 03/30/2019

## 2019-04-04 ENCOUNTER — Encounter: Payer: Self-pay | Admitting: Family Medicine

## 2019-05-02 ENCOUNTER — Encounter: Payer: Self-pay | Admitting: Family Medicine

## 2019-05-09 DIAGNOSIS — K080 Exfoliation of teeth due to systemic causes: Secondary | ICD-10-CM | POA: Diagnosis not present

## 2019-06-21 ENCOUNTER — Telehealth (HOSPITAL_COMMUNITY): Payer: Self-pay

## 2019-06-21 NOTE — Telephone Encounter (Signed)
error 

## 2019-06-22 ENCOUNTER — Other Ambulatory Visit (HOSPITAL_COMMUNITY): Payer: Self-pay

## 2019-06-22 DIAGNOSIS — Z8249 Family history of ischemic heart disease and other diseases of the circulatory system: Secondary | ICD-10-CM

## 2019-06-22 NOTE — Progress Notes (Signed)
Genetic testing orders entered per Dr Haroldine Laws.

## 2019-06-28 ENCOUNTER — Other Ambulatory Visit: Payer: Self-pay

## 2019-06-29 DIAGNOSIS — L570 Actinic keratosis: Secondary | ICD-10-CM | POA: Diagnosis not present

## 2019-07-03 ENCOUNTER — Other Ambulatory Visit: Payer: Self-pay

## 2019-07-03 ENCOUNTER — Encounter: Payer: Self-pay | Admitting: Internal Medicine

## 2019-07-03 ENCOUNTER — Ambulatory Visit (INDEPENDENT_AMBULATORY_CARE_PROVIDER_SITE_OTHER): Payer: Federal, State, Local not specified - PPO | Admitting: Internal Medicine

## 2019-07-03 VITALS — BP 122/80 | HR 80 | Ht 63.0 in | Wt 138.0 lb

## 2019-07-03 DIAGNOSIS — E063 Autoimmune thyroiditis: Secondary | ICD-10-CM

## 2019-07-03 DIAGNOSIS — E038 Other specified hypothyroidism: Secondary | ICD-10-CM

## 2019-07-03 DIAGNOSIS — E559 Vitamin D deficiency, unspecified: Secondary | ICD-10-CM | POA: Diagnosis not present

## 2019-07-03 DIAGNOSIS — M810 Age-related osteoporosis without current pathological fracture: Secondary | ICD-10-CM | POA: Diagnosis not present

## 2019-07-03 LAB — T4, FREE: Free T4: 0.7 ng/dL (ref 0.60–1.60)

## 2019-07-03 LAB — VITAMIN D 25 HYDROXY (VIT D DEFICIENCY, FRACTURES): VITD: 36.53 ng/mL (ref 30.00–100.00)

## 2019-07-03 LAB — TSH: TSH: 11.15 u[IU]/mL — ABNORMAL HIGH (ref 0.35–4.50)

## 2019-07-03 MED ORDER — SYNTHROID 50 MCG PO TABS: 100.0000 ug | ORAL_TABLET | Freq: Every day | ORAL | 3 refills | Status: DC

## 2019-07-03 NOTE — Progress Notes (Signed)
Patient ID: Nicole Henry, female   DOB: Oct 26, 1961, 58 y.o.   MRN: [id number]    HPI  Nicole Henry is a 57 y.o.-year-old female, returning for follow-up for uncontrolled Hashimoto's hypothyroidism, vitamin D deficiency, osteoporosis. Last visit 6 mo ago.  Pt. has been dx with hypothyroidism in 2009 (fatigue, TSH 16) >> started on Synthroid d.a.w.  She cannot take levothyroxine with dyes, therefore, she is on 50 mcg tablets.  She is currently on Synthroid d.a.w. 75 mcg daily (1.5 tablets of 50 mcg dose)  She takes the thyroid hormone: - in am - fasting - at least 30 min from b'fast - no Ca, Fe - + MVI, PPIs later in the day - in the evening - not on Biotin  TFTs were normal: Lab Results  Component Value Date   TSH 0.49 12/29/2018   TSH 0.48 11/21/2018   TSH 0.08 (L) 10/03/2018   TSH 32.97 (H) 06/27/2018   TSH 0.10 (L) 03/08/2018   TSH 6.56 (H) 01/20/2018   TSH 8.81 (H) 11/09/2017   TSH 5.95 (H) 09/15/2017   TSH 0.55 07/21/2016   TSH 6.56 (H) 07/04/2015   FREET4 1.00 12/29/2018   FREET4 0.80 11/21/2018   FREET4 1.10 10/03/2018   FREET4 0.59 (L) 06/27/2018   FREET4 0.84 03/08/2018   FREET4 0.85 01/20/2018   FREET4 0.90 09/15/2017   FREET4 1.00 07/21/2016   FREET4 0.84 07/04/2015   FREET4 1.22 04/30/2015   T3FREE 2.8 03/08/2018   T3FREE 2.7 07/25/2014   She had elevated antithyroid antibodies: Lab Results  Component Value Date   THGAB 9 (H) 11/21/2018   THGAB 9 (H) 06/27/2018   Component     Latest Ref Rng & Units 06/27/2018 11/21/2018  Thyroperoxidase Ab SerPl-aCnc     0 - 34 IU/mL 127 (H) 61 (H)   Pt denies: - feeling nodules in neck - hoarseness - dysphagia - choking - SOB with lying down  She has no FH of thyroid disorders. No FH of thyroid cancer. No h/o radiation tx to head or neck.  No seaweed or kelp. No recent contrast studies. No herbal supplements. No Biotin use. No recent steroids use.   She also has vitamin D deficiency: Lab Results   Component Value Date   VD25OH 26.82 (L) 11/21/2018   VD25OH 30.35 10/03/2018   VD25OH 23.38 (L) 06/27/2018   At last visit we increased the dose to 3000 units vitamin D3 + a multivitamin.  She has a history of osteoporosis:  Reviewed previous bone density reports: DXA (09/01/2018): RFN 09/01/2018     -2.4  RFN 06/29/2016    -2.5  LFN 09/01/2018      -2.3 LFN 06/29/2016       -2.2  AP Spine L1-L4   09/01/2018  56.8     -1.4  1.015 g/cm2 (+1%) AP Spine L1-L4   06/29/2016  54.6     -1.5  1.005 g/cm2  DualFemur Total Mean 09/01/2018  56.8     -1.3  0.838 g/cm2 DualFemur Total Mean 06/29/2016  54.6     -1.5  0.824 g/cm2  FRAX: Major osteoporosis fracture risk over 10 years: 9.5% Hip fracture risk over 10 years: 1.6%  Stable T-scores, in the osteopenic range.  She is Walking/running.  Also, hyperlipidemia.  ROS: Constitutional: no weight gain/no weight loss, no fatigue, no subjective hyperthermia, no subjective hypothermia Eyes: no blurry vision, + xerophthalmia ENT: no sore throat, + see HPI Cardiovascular: no CP/no SOB/no palpitations/no leg swelling Respiratory:  no cough/no SOB/no wheezing Gastrointestinal: no N/no V/no D/no C/no acid reflux Musculoskeletal: no muscle aches/no joint aches Skin: no rashes, no hair loss Neurological: no tremors/no numbness/no tingling/no dizziness  I reviewed pt's medications, allergies, PMH, social hx, family hx, and changes were documented in the history of present illness. Otherwise, unchanged from my initial visit note.  Past Medical History:  Diagnosis Date  . Allergic rhinitis due to pollen   . Basal cell carcinoma    history of  . Family history of diabetes mellitus   . GERD (gastroesophageal reflux disease)   . Hypothyroidism   . Pure hypercholesterolemia   . Rosacea   . Screening for lipoid disorders    Past Surgical History:  Procedure Laterality Date  . AUGMENTATION  MAMMAPLASTY Bilateral   . BREAST ENHANCEMENT SURGERY  1997   Social History   Socioeconomic History  . Marital status: Married    Spouse name: Not on file  . Number of children: 2  . Years of education: Not on file  . Highest education level: Not on file  Occupational History    Comment: works for [employer]  . Financial resource strain: Not on file  . Food insecurity    Worry: Not on file    Inability: Not on file  . Transportation needs    Medical: Not on file    Non-medical: Not on file  Tobacco Use  . Smoking status: Never Smoker  . Smokeless tobacco: Never Used  Substance and Sexual Activity  . Alcohol use: No  . Drug use: No  . Sexual activity: Not on file  Lifestyle  . Physical activity    Days per week: Not on file    Minutes per session: Not on file  . Stress: Not on file  Relationships  . Social Herbalist on phone: Not on file    Gets together: Not on file    Attends religious service: Not on file    Active member of club or organization: Not on file    Attends meetings of clubs or organizations: Not on file    Relationship status: Not on file  . Intimate partner violence    Fear of current or ex partner: Not on file    Emotionally abused: Not on file    Physically abused: Not on file    Forced sexual activity: Not on file  Other Topics Concern  . Not on file  Social History Narrative   Regular exercise- yes, running 2 to 3 times a week.   Diet- fruits and veggies.   Current Outpatient Medications on File Prior to Visit  Medication Sig Dispense Refill  . acetaminophen (TYLENOL 8 HOUR ARTHRITIS PAIN) 650 MG CR tablet Take 650 mg by mouth every 8 (eight) hours as needed for pain.    . hydrochlorothiazide (MICROZIDE) 12.5 MG capsule TAKE 1 CAPSULE(12.5 MG) BY MOUTH DAILY. 90 capsule 2  . sertraline (ZOLOFT) 50 MG tablet TAKE 1 TABLET BY MOUTH EVERY DAY 90 tablet 2  . SYNTHROID 50 MCG tablet Take 1.5 tablets  (75 mcg total) by mouth daily before breakfast. 150 tablet 3   No current facility-administered medications on file prior to visit.    Allergies  Allergen Reactions  . Ciprofloxacin   . Nitrofurantoin Other (See Comments)    unknown  . Simvastatin     myaglias  . Penicillins Rash  . Sulfonamide Derivatives Rash  . Yellow Dyes (Non-Tartrazine) Rash  Family History  Problem Relation Age of Onset  . Hypertension Father   . Prostate cancer Father   . Heart disease Brother        ? heart issue  . Heart attack Paternal Grandfather        late age    PE: BP 122/80 (BP Location: Right Arm, Patient Position: Sitting, Cuff Size: Normal)   Pulse 80   Ht 5\' 3"  (1.6 m)   Wt 138 lb (62.6 kg)   SpO2 97%   BMI 24.45 kg/m  Wt Readings from Last 3 Encounters:  07/03/19 138 lb (62.6 kg)  02/20/19 133 lb 9.6 oz (60.6 kg)  02/03/19 131 lb (59.4 kg)   Constitutional: normal weight, in NAD Eyes: PERRLA, EOMI, no exophthalmos ENT: moist mucous membranes, no thyromegaly, no cervical lymphadenopathy Cardiovascular: RRR, No MRG Respiratory: CTA B Gastrointestinal: abdomen soft, NT, ND, BS+ Musculoskeletal: no deformities, strength intact in all 4 Skin: moist, warm, no rashes Neurological: no tremor with outstretched hands, DTR normal in all 4  ASSESSMENT: 1. Hypothyroidism  2. Hashimoto's thyroiditis  3. Osteoporosis   4. Vit D def  PLAN:  1. And 2.  Patient with longstanding hypothyroidism, on levothyroxine therapy - thyroid labs reviewed with pt >> normal 11/2018 and 12/2018 - she continues on LT4 75 mcg daily (1.5 tablets of 50 mcg dose) - pt feels good on this dose.  Her only complaint is dry eyes.  We discussed about maybe trying selenium to see if this helps I also recommended an ophthalmologist. - we discussed about taking the thyroid hormone every day, with water, >30 minutes before breakfast, separated by >4 hours from acid reflux medications, calcium, iron,  multivitamins. Pt. is taking it correctly. - will check thyroid tests today: TSH and fT4 - If labs are abnormal, she will need to return for repeat TFTs in 1.5 months  3. Osteoporosis  -Reviewed previous bone density reports: At last check, the T-scores were stable or slightly improved -For now, will continue walking and running (I also suggested weightbearing exercises) and also try to normalize her vitamin D level -At last visits we also discussed about improvement in her diet-low acid eating -We also discussed about different medication classes, benefits, and side effects.  She discussed about these with her dentist and he strongly discouraged their use -We decided to manage her expectantly for now, and check another bone density in 08/2020, 2 years from the previous  4.  Vitamin D deficiency -Her vitamin D level was still low (26.8) on 1000 units vit D3 + MVI >> at last visit, we increased to 3000 units + MVI daily.  However, she is not quite sure whether she is taking this dose so she will check at home and let me know -We will recheck the level today  Component     Latest Ref Rng & Units 07/03/2019  TSH     0.35 - 4.50 uIU/mL 11.15 (H)  T4,Free(Direct)     0.60 - 1.60 ng/dL 0.70  VITD     30.00 - 100.00 ng/mL 36.53   TSH is high.  Will check with patient whether she missed any doses, if not, will increase the dose to 100 mcg daily and recheck her TFTs in 1.5 months.  Philemon Kingdom, MD PhD [facility] Endocrinology

## 2019-07-03 NOTE — Patient Instructions (Addendum)
Please stop at the lab.  Please continue Synthroid 75 mcg daily.  Take the thyroid hormone every day, with water, at least 30 minutes before breakfast, separated by at least 4 hours from: - acid reflux medications - calcium - iron - multivitamins   You can try Selenium 200 mcg daily.  Continue the same dose of vitamin D.  Eye doctor: [facility]:  Dr. Sherlyn Lick MD ?  Address: [address], [place], [place] [address]  [phone]  Please come back for a follow-up appointment in 1 year.

## 2019-07-05 ENCOUNTER — Encounter: Payer: Self-pay | Admitting: Internal Medicine

## 2019-08-17 DIAGNOSIS — L821 Other seborrheic keratosis: Secondary | ICD-10-CM | POA: Diagnosis not present

## 2019-08-17 DIAGNOSIS — D2262 Melanocytic nevi of left upper limb, including shoulder: Secondary | ICD-10-CM | POA: Diagnosis not present

## 2019-08-17 DIAGNOSIS — L720 Epidermal cyst: Secondary | ICD-10-CM | POA: Diagnosis not present

## 2019-08-17 DIAGNOSIS — Z85828 Personal history of other malignant neoplasm of skin: Secondary | ICD-10-CM | POA: Diagnosis not present

## 2019-09-05 DIAGNOSIS — H18832 Recurrent erosion of cornea, left eye: Secondary | ICD-10-CM | POA: Diagnosis not present

## 2019-09-28 ENCOUNTER — Other Ambulatory Visit: Payer: Self-pay

## 2019-09-28 ENCOUNTER — Other Ambulatory Visit (INDEPENDENT_AMBULATORY_CARE_PROVIDER_SITE_OTHER): Payer: Federal, State, Local not specified - PPO

## 2019-09-28 ENCOUNTER — Other Ambulatory Visit: Payer: Self-pay | Admitting: Internal Medicine

## 2019-09-28 DIAGNOSIS — E063 Autoimmune thyroiditis: Secondary | ICD-10-CM | POA: Diagnosis not present

## 2019-09-28 DIAGNOSIS — E038 Other specified hypothyroidism: Secondary | ICD-10-CM

## 2019-09-28 LAB — TSH: TSH: 0.12 u[IU]/mL — ABNORMAL LOW (ref 0.35–4.50)

## 2019-09-28 LAB — T4, FREE: Free T4: 1.26 ng/dL (ref 0.60–1.60)

## 2019-10-05 ENCOUNTER — Encounter: Payer: Self-pay | Admitting: Family Medicine

## 2019-10-25 ENCOUNTER — Other Ambulatory Visit: Payer: Self-pay

## 2019-10-25 DIAGNOSIS — Z20822 Contact with and (suspected) exposure to covid-19: Secondary | ICD-10-CM

## 2019-10-28 LAB — NOVEL CORONAVIRUS, NAA: SARS-CoV-2, NAA: NOT DETECTED

## 2019-11-06 ENCOUNTER — Telehealth (HOSPITAL_COMMUNITY): Payer: Self-pay | Admitting: Emergency Medicine

## 2019-11-06 DIAGNOSIS — H18832 Recurrent erosion of cornea, left eye: Secondary | ICD-10-CM | POA: Diagnosis not present

## 2019-11-06 NOTE — Telephone Encounter (Signed)
Left message on voicemail with name and callback number Jessalyn Hinojosa RN Navigator [facility] [phone] Office [phone] Cell  

## 2019-11-07 ENCOUNTER — Other Ambulatory Visit: Payer: Self-pay

## 2019-11-07 ENCOUNTER — Other Ambulatory Visit (HOSPITAL_COMMUNITY): Payer: Federal, State, Local not specified - PPO

## 2019-11-07 ENCOUNTER — Ambulatory Visit (HOSPITAL_COMMUNITY)
Admission: RE | Admit: 2019-11-07 | Discharge: 2019-11-07 | Disposition: A | Discharge status: Home / Self Care | Payer: Federal, State, Local not specified - PPO | Source: Ambulatory Visit | Attending: Internal Medicine | Admitting: Internal Medicine

## 2019-11-07 DIAGNOSIS — Z789 Other specified health status: Secondary | ICD-10-CM

## 2019-11-07 DIAGNOSIS — Z136 Encounter for screening for cardiovascular disorders: Secondary | ICD-10-CM | POA: Insufficient documentation

## 2019-11-07 MED ORDER — GADOBENATE DIMEGLUMINE 529 MG/ML IV SOLN
8.0000 mL | Freq: Once | INTRAVENOUS | Status: AC | PRN
  Administered 2019-11-07: 8 mL via INTRAVENOUS

## 2019-11-13 ENCOUNTER — Telehealth (HOSPITAL_COMMUNITY): Payer: Self-pay

## 2019-11-13 NOTE — Telephone Encounter (Signed)
Pt aware of results 

## 2019-11-13 NOTE — Telephone Encounter (Signed)
-----   Message from Jolaine Artist, MD sent at 11/13/2019  1:15 PM EST ----- Normal cMRI.

## 2020-01-01 ENCOUNTER — Other Ambulatory Visit: Payer: Self-pay | Admitting: Internal Medicine

## 2020-02-16 ENCOUNTER — Telehealth: Payer: Self-pay | Admitting: General Practice

## 2020-02-16 MED ORDER — SERTRALINE HCL 50 MG PO TABS: ORAL_TABLET | ORAL | 0 refills | Status: DC

## 2020-02-16 NOTE — Telephone Encounter (Signed)
Patient is requesting a refill for zoloft sent to [facility] [address] in [place]. Informed patient that she will need to select a new PCP due to Dr. Deborra Medina leaving the practice. Patient stated that she had 2 refills prior to Dr. Deborra Medina leaving and will call back to let us know who she will select to [facility].. CB is [phone]

## 2020-02-16 NOTE — Telephone Encounter (Signed)
Rx refilled #[id number]

## 2020-03-05 DIAGNOSIS — H04123 Dry eye syndrome of bilateral lacrimal glands: Secondary | ICD-10-CM | POA: Diagnosis not present

## 2020-03-06 DIAGNOSIS — Z85828 Personal history of other malignant neoplasm of skin: Secondary | ICD-10-CM | POA: Diagnosis not present

## 2020-03-06 DIAGNOSIS — L570 Actinic keratosis: Secondary | ICD-10-CM | POA: Diagnosis not present

## 2020-03-06 DIAGNOSIS — L820 Inflamed seborrheic keratosis: Secondary | ICD-10-CM | POA: Diagnosis not present

## 2020-03-06 DIAGNOSIS — C44311 Basal cell carcinoma of skin of nose: Secondary | ICD-10-CM | POA: Diagnosis not present

## 2020-03-08 ENCOUNTER — Ambulatory Visit: Payer: Federal, State, Local not specified - PPO | Attending: Internal Medicine

## 2020-03-08 DIAGNOSIS — Z23 Encounter for immunization: Secondary | ICD-10-CM

## 2020-03-08 NOTE — Progress Notes (Signed)
   Covid-19 Vaccination Clinic  Name:  MOON FULLILOVE    MRN: [id number] DOB: Oct 28, 1961  03/08/2020  Ms. Hammett was observed post Covid-19 immunization for 15 minutes without incident. She was provided with Vaccine Information Sheet and instruction to access the V-Safe system.   Ms. Ishihara was instructed to call 911 with any severe reactions post vaccine: Marland Kitchen Difficulty breathing  . Swelling of face and throat  . A fast heartbeat  . A bad rash all over body  . Dizziness and weakness   Immunizations Administered    Name Date Dose VIS Date Route   Moderna COVID-19 Vaccine 03/08/2020  9:44 AM 0.5 mL 11/14/2019 Intramuscular   Manufacturer: Moderna   Lot: [id number]   [id number]

## 2020-03-21 ENCOUNTER — Other Ambulatory Visit: Payer: Self-pay | Admitting: Family Medicine

## 2020-03-21 NOTE — Telephone Encounter (Signed)
Spoke with patient regarding medication refill per patient she is unsure if she will stay at this location with one of the providers she will give [place] a call back to let us know and she does not need a refill as of now.

## 2020-03-28 DIAGNOSIS — C44311 Basal cell carcinoma of skin of nose: Secondary | ICD-10-CM | POA: Diagnosis not present

## 2020-04-10 ENCOUNTER — Ambulatory Visit: Payer: Federal, State, Local not specified - PPO

## 2020-05-01 DIAGNOSIS — L718 Other rosacea: Secondary | ICD-10-CM | POA: Diagnosis not present

## 2020-05-15 ENCOUNTER — Other Ambulatory Visit: Payer: Self-pay

## 2020-05-15 ENCOUNTER — Encounter: Payer: Self-pay | Admitting: Family Medicine

## 2020-05-15 ENCOUNTER — Ambulatory Visit: Payer: Federal, State, Local not specified - PPO | Admitting: Family Medicine

## 2020-05-15 VITALS — BP 140/76 | HR 79 | Temp 98.7°F | Ht 63.0 in | Wt 134.8 lb

## 2020-05-15 DIAGNOSIS — E7800 Pure hypercholesterolemia, unspecified: Secondary | ICD-10-CM | POA: Diagnosis not present

## 2020-05-15 DIAGNOSIS — F4322 Adjustment disorder with anxiety: Secondary | ICD-10-CM | POA: Diagnosis not present

## 2020-05-15 MED ORDER — SERTRALINE HCL 50 MG PO TABS: ORAL_TABLET | ORAL | 3 refills | Status: DC

## 2020-05-15 NOTE — Patient Instructions (Signed)
Good to see you today!  

## 2020-05-15 NOTE — Progress Notes (Signed)
   Subjective:    Patient ID: Nicole Henry, female    DOB: 03-Feb-1961, 59 y.o.   MRN: [id number]  HPI  Chief Complaint  Patient presents with  . Medication Refill    Zoloft  This is a 59 yo female, prior patient of Dr. Deborra Medina, who presents today for medication refill of sertraline. Has been doing well on 50 mg dose.   She is seen by Dr. Letta Median for management of hypothyroidism.   Hyperlipidemia- was previously on medication, has been off for awhile, made her feel bad.   HTN- previous diagnosis and HCTZ on medication list. Per patient, she monitored at home, had normal readings and was able to come off medication.    Review of Systems Per HPI    Objective:   Physical Exam Vitals reviewed.  Constitutional:      General: She is not in acute distress.    Appearance: Normal appearance. She is normal weight. She is not ill-appearing, toxic-appearing or diaphoretic.  HENT:     Head: Normocephalic and atraumatic.  Eyes:     Conjunctiva/sclera: Conjunctivae normal.  Cardiovascular:     Rate and Rhythm: Normal rate.  Pulmonary:     Effort: Pulmonary effort is normal.  Neurological:     Mental Status: She is alert and oriented to person, place, and time.  Psychiatric:        Mood and Affect: Mood normal.        Behavior: Behavior normal.        Thought Content: Thought content normal.        Judgment: Judgment normal.       BP 140/76 (BP Location: Right Arm, Patient Position: Sitting, Cuff Size: Normal)   Pulse 79   Temp 98.7 F (37.1 C) (Temporal)   Ht 5\' 3"  (1.6 m)   Wt 134 lb 12.8 oz (61.1 kg)   SpO2 97%   BMI 23.88 kg/m  Wt Readings from Last 3 Encounters:  05/15/20 134 lb 12.8 oz (61.1 kg)  07/03/19 138 lb (62.6 kg)  02/20/19 133 lb 9.6 oz (60.6 kg)   Depression screen [facility] 2/9 05/15/2020 03/30/2019 09/15/2017  Decreased Interest 0 0 0  Down, Depressed, Hopeless 0 0 0  PHQ - 2 Score 0 0 0  Altered sleeping 0 - -  Tired, decreased energy 0 - -  Change in  appetite 0 - -  Feeling bad or failure about yourself  0 - -  Trouble concentrating 0 - -  Moving slowly or fidgety/restless 0 - -  Suicidal thoughts 0 - -  PHQ-9 Score 0 - -  Difficult doing work/chores Not difficult at all - -       Assessment & Plan:  1. Adjustment disorder with anxiety - doing well on current medication, will continue - sertraline (ZOLOFT) 50 MG tablet; TAKE 1 TABLET BY MOUTH EVERY DAY  Dispense: 90 tablet; Refill: 3  2. Elevated cholesterol - Comprehensive metabolic panel - Lipid Panel  - follow up as planned for establish care  This visit occurred during the SARS-CoV-2 public health emergency.  Safety protocols were in place, including screening questions prior to the visit, additional usage of staff PPE, and extensive cleaning of exam room while observing appropriate contact time as indicated for disinfecting solutions.     Clarene Reamer, FNP-BC  [facility]  05/15/2020 2:27 PM

## 2020-05-16 LAB — COMPREHENSIVE METABOLIC PANEL
ALT: 9 U/L (ref 0–35)
AST: 16 U/L (ref 0–37)
Albumin: 4.4 g/dL (ref 3.5–5.2)
Alkaline Phosphatase: 74 U/L (ref 39–117)
BUN: 11 mg/dL (ref 6–23)
CO2: 29 mEq/L (ref 19–32)
Calcium: 9.5 mg/dL (ref 8.4–10.5)
Chloride: 103 mEq/L (ref 96–112)
Creatinine, Ser: 0.75 mg/dL (ref 0.40–1.20)
GFR: 79.22 mL/min (ref >60.00)
Glucose, Bld: 78 mg/dL (ref 70–99)
Potassium: 4.3 mEq/L (ref 3.5–5.1)
Sodium: 138 mEq/L (ref 135–145)
Total Bilirubin: 0.3 mg/dL (ref 0.2–1.2)
Total Protein: 7.3 g/dL (ref 6.0–8.3)

## 2020-05-16 LAB — LIPID PANEL
Cholesterol: 256 mg/dL — ABNORMAL HIGH (ref 0–200)
HDL: 58.8 mg/dL (ref >39.00)
LDL Cholesterol: 160 mg/dL — ABNORMAL HIGH (ref 0–99)
NonHDL: 197.46
Total CHOL/HDL Ratio: 4
Triglycerides: 185 mg/dL — ABNORMAL HIGH (ref 0.0–149.0)
VLDL: 37 mg/dL (ref 0.0–40.0)

## 2020-06-21 ENCOUNTER — Encounter: Payer: Federal, State, Local not specified - PPO | Admitting: Family Medicine

## 2020-06-21 DIAGNOSIS — Z0289 Encounter for other administrative examinations: Secondary | ICD-10-CM

## 2020-06-28 ENCOUNTER — Encounter: Payer: Self-pay | Admitting: Internal Medicine

## 2020-07-01 ENCOUNTER — Ambulatory Visit: Payer: Federal, State, Local not specified - PPO | Admitting: Internal Medicine

## 2020-07-01 ENCOUNTER — Other Ambulatory Visit: Payer: Self-pay

## 2020-07-01 ENCOUNTER — Encounter: Payer: Self-pay | Admitting: Internal Medicine

## 2020-07-01 VITALS — BP 122/70 | HR 77 | Ht 63.0 in | Wt 134.0 lb

## 2020-07-01 DIAGNOSIS — M810 Age-related osteoporosis without current pathological fracture: Secondary | ICD-10-CM

## 2020-07-01 DIAGNOSIS — E063 Autoimmune thyroiditis: Secondary | ICD-10-CM

## 2020-07-01 DIAGNOSIS — E559 Vitamin D deficiency, unspecified: Secondary | ICD-10-CM | POA: Diagnosis not present

## 2020-07-01 DIAGNOSIS — E038 Other specified hypothyroidism: Secondary | ICD-10-CM | POA: Diagnosis not present

## 2020-07-01 LAB — TSH: TSH: 2.5 u[IU]/mL (ref 0.35–4.50)

## 2020-07-01 LAB — T4, FREE: Free T4: 0.6 ng/dL (ref 0.60–1.60)

## 2020-07-01 MED ORDER — SYNTHROID 50 MCG PO TABS: ORAL_TABLET | ORAL | 3 refills | Status: DC

## 2020-07-01 NOTE — Patient Instructions (Addendum)
Please stop at the lab.  Please continue Synthroid 75 alternating with 100 mcg every other day.  Take the thyroid hormone every day, with water, at least 30 minutes before breakfast, separated by at least 4 hours from: - acid reflux medications - calcium - iron - multivitamins  Try Selenium 200 mcg daily.  Please come back for a follow-up appointment in 1 year.

## 2020-07-01 NOTE — Progress Notes (Addendum)
Patient ID: Nicole Henry, female   DOB: Jan 09, 1961, 59 y.o.   MRN: [id number]   This visit occurred during the SARS-CoV-2 public health emergency.  Safety protocols were in place, including screening questions prior to the visit, additional usage of staff PPE, and extensive cleaning of exam room while observing appropriate contact time as indicated for disinfecting solutions.   HPI  Nicole Henry is a 59 y.o.-year-old female, returning for follow-up for uncontrolled Hashimoto's hypothyroidism, vitamin D deficiency, osteoporosis. Last visit 1 year ago.  Pt. has been dx with hypothyroidism in 2009 (fatigue, TSH 16) >> started on Synthroid d.a.w.  She cannot take levothyroxine with dyes, therefore, she is on white, 50 mcg, tablets  She is currently on Synthroid d.a.w. 100 alternating with 75 mcg every other daily (2 alt. With 1.5 tablet of the 50 mcg dose every other day). Lower dose: 4x a week, higher dose: 3x a week.  She takes the thyroid hormone: - in am - fasting - at least 30 min from b'fast - no Ca, Fe - + MVI, PPIs -later in the day - not on Biotin  She did not start selenium 200 mcg daily - she forgot.  TFTs reviewed: Lab Results  Component Value Date   TSH 0.12 (L) 09/28/2019   TSH 11.15 (H) 07/03/2019   TSH 0.49 12/29/2018   TSH 0.48 11/21/2018   TSH 0.08 (L) 10/03/2018   TSH 32.97 (H) 06/27/2018   TSH 0.10 (L) 03/08/2018   TSH 6.56 (H) 01/20/2018   TSH 8.81 (H) 11/09/2017   TSH 5.95 (H) 09/15/2017   FREET4 1.26 09/28/2019   FREET4 0.70 07/03/2019   FREET4 1.00 12/29/2018   FREET4 0.80 11/21/2018   FREET4 1.10 10/03/2018   FREET4 0.59 (L) 06/27/2018   FREET4 0.84 03/08/2018   FREET4 0.85 01/20/2018   FREET4 0.90 09/15/2017   FREET4 1.00 07/21/2016   T3FREE 2.8 03/08/2018   T3FREE 2.7 07/25/2014   She had elevated antithyroid antibodies: Lab Results  Component Value Date   THGAB 9 (H) 11/21/2018   THGAB 9 (H) 06/27/2018   Component     Latest Ref Rng &  Units 06/27/2018 11/21/2018  Thyroperoxidase Ab SerPl-aCnc     0 - 34 IU/mL 127 (H) 61 (H)   No FH of thyroid cancer. No h/o radiation tx to head or neck.  No herbal supplements. No Biotin use. No recent steroids use.   Vitamin D deficiency:  Reviewed vitamin D levels: Lab Results  Component Value Date   VD25OH 36.53 07/03/2019   VD25OH 26.82 (L) 11/21/2018   VD25OH 30.35 10/03/2018   VD25OH 23.38 (L) 06/27/2018   She is on 3000 >> 5000units vitamin D3 + a multivitamin daily.  Osteoporosis:  Reviewed previous bone density reports: DXA (09/01/2018): RFN 09/01/2018     -2.4  RFN 06/29/2016    -2.5  LFN 09/01/2018      -2.3 LFN 06/29/2016       -2.2  AP Spine L1-L4   09/01/2018  56.8     -1.4  1.015 g/cm2 (+1%) AP Spine L1-L4   06/29/2016  54.6     -1.5  1.005 g/cm2  DualFemur Total Mean 09/01/2018  56.8     -1.3  0.838 g/cm2 DualFemur Total Mean 06/29/2016  54.6     -1.5  0.824 g/cm2  FRAX: Major osteoporosis fracture risk over 10 years: 9.5% Hip fracture risk over 10 years: 1.6%  Stable T-scores, in the osteopenic range.  She continues to  walk and run.  She also has a history of hyperlipidemia.  ROS: Constitutional: no weight gain/no weight loss, + fatigue, no subjective hyperthermia, no subjective hypothermia Eyes: no blurry vision, + L eye xerophthalmia ENT: no sore throat, + see HPI Cardiovascular: no CP/no SOB/no palpitations/no leg swelling Respiratory: no cough/no SOB/no wheezing Gastrointestinal: no N/no V/no D/+ C/no acid reflux Musculoskeletal: no muscle aches/no joint aches Skin: no rashes, + hair loss Neurological: no tremors/no numbness/no tingling/no dizziness  I reviewed pt's medications, allergies, PMH, social hx, family hx, and changes were documented in the history of present illness. Otherwise, unchanged from my initial visit note.  Past Medical History:  Diagnosis Date  . Allergic rhinitis  due to pollen   . Basal cell carcinoma    history of  . Family history of diabetes mellitus   . GERD (gastroesophageal reflux disease)   . Hypothyroidism   . Pure hypercholesterolemia   . Rosacea   . Screening for lipoid disorders    Past Surgical History:  Procedure Laterality Date  . AUGMENTATION MAMMAPLASTY Bilateral   . BREAST ENHANCEMENT SURGERY  1997   Social History   Socioeconomic History  . Marital status: Married    Spouse name: Not on file  . Number of children: 2  . Years of education: Not on file  . 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 Smoking status: Never Smoker  . Smokeless tobacco: Never Used  Substance and Sexual Activity  . Alcohol use: No  . Drug use: No  . Sexual activity: Not on file  Other Topics Concern  . Not on file  Social History Narrative   Regular exercise- yes, running 2 to 3 times a week.   Diet- fruits and veggies.   Social Determinants of Health   Financial Resource Strain:   . Difficulty of Paying Living Expenses:   Food Insecurity:   . Worried About Charity fundraiser in the Last Year:   . Arboriculturist in the Last Year:   Transportation Needs:   . Film/video editor (Medical):   Marland Kitchen Lack of Transportation (Non-Medical):   Physical Activity:   . Days of Exercise per Week:   . Minutes of Exercise per Session:   Stress:   . Feeling of Stress :   Social Connections:   . Frequency of Communication with Friends and Family:   . Frequency of Social Gatherings with Friends and Family:   . Attends Religious Services:   . Active Member of Clubs or Organizations:   . Attends Archivist Meetings:   Marland Kitchen Marital Status:   Intimate Partner Violence:   . Fear of Current or Ex-Partner:   . Emotionally Abused:   Marland Kitchen Physically Abused:   . Sexually Abused:    Current Outpatient Medications on File Prior to Visit  Medication Sig  Dispense Refill  . sertraline (ZOLOFT) 50 MG tablet TAKE 1 TABLET BY MOUTH EVERY DAY 90 tablet 3  . SYNTHROID 50 MCG tablet TAKE 1 AND 1/2 TABLETS(75 MCG) BY MOUTH DAILY BEFORE BREAKFAST 150 tablet 1   No current facility-administered medications on file prior to visit.   Allergies  Allergen Reactions  . Ciprofloxacin   . Nitrofurantoin Other (See Comments)    unknown  . Simvastatin     myaglias  . Penicillins Rash  . Sulfonamide Derivatives Rash  . Yellow Dyes (Non-Tartrazine) Rash  Family History  Problem Relation Age of Onset  . Hypertension Father   . Prostate cancer Father   . Heart disease Brother        ? heart issue  . Heart attack Paternal Grandfather        late age    PE: BP 122/70   Pulse 77   Ht 5\' 3"  (1.6 m)   Wt 134 lb (60.8 kg)   SpO2 97%   BMI 23.74 kg/m  Wt Readings from Last 3 Encounters:  07/01/20 134 lb (60.8 kg)  05/15/20 134 lb 12.8 oz (61.1 kg)  07/03/19 138 lb (62.6 kg)   Constitutional: normal weight, in NAD Eyes: PERRLA, EOMI, no exophthalmos ENT: moist mucous membranes, no thyromegaly, no cervical lymphadenopathy Cardiovascular: RRR, No MRG Respiratory: CTA B Gastrointestinal: abdomen soft, NT, ND, BS+ Musculoskeletal: no deformities, strength intact in all 4 Skin: moist, warm, no rashes Neurological: no tremor with outstretched hands, DTR normal in all 4  ASSESSMENT: 1. Hypothyroidism due to Hashimoto's thyroiditis  2. Osteoporosis   3. Vit D def  PLAN:  1.  Patient with longstanding hypothyroidism, on Synthroid brand name - latest thyroid labs reviewed with pt >> suppressed: Lab Results  Component Value Date   TSH 0.12 (L) 09/28/2019   - she continues on Synthroid 75 mcg (1.5 tablets) 4/7 days and 100 mcg 3/7 days - pt feels that her levels may be off-has fatigue, hair loss, constipation - we discussed about taking the thyroid hormone every day, with water, >30 minutes before breakfast, separated by >4 hours from acid  reflux medications, calcium, iron, multivitamins. Pt. is taking it correctly. - will check thyroid tests today: TSH and fT4 - If labs are abnormal, she will need to return for repeat TFTs in 1.5 months  2. Osteoporosis  -Reviewed previous bone density reports: At last check, in 2019, the T-scores were stable or slightly improved -She continues to walk and run.  At last visit I also suggested weightbearing exercises. -At last visit, we discussed about different medication classes, benefits, and side effects.  She discussed about this with her dentist and he strongly discouraged the use... -We are managing her expectantly for now, and plan to check another bone density in 09/21, 2 years from the previous -I advised her to contact me so I can order this  3.  Vitamin D deficiency -On 5000 units vitamin D daily (increased from last visit) + multivitamin a day -Latest level reviewed and this was normal in 06/2019 -We will repeat her level today  Needs refills.  Office Visit on 07/01/2020  Component Date Value Ref Range Status  . TSH 07/01/2020 2.50  0.35 - 4.50 uIU/mL Final  . Free T4 07/01/2020 0.60  0.60 - 1.60 ng/dL Final   Comment: Specimens from patients who are undergoing biotin therapy and /or ingesting biotin supplements may contain high levels of biotin.  The higher biotin concentration in these specimens interferes with this Free T4 assay.  Specimens that contain high levels  of biotin may cause false high results for this Free T4 assay.  Please interpret results in light of the total clinical presentation of the patient.    . Vit D, 25-Hydroxy 07/01/2020 32.2  30.0 - 100.0 ng/mL Final   Comment: Vitamin D deficiency has been defined by the [place] practice guideline as a level of serum 25-OH vitamin D less than 20 ng/mL (1,2). The Endocrine Society went on to further define  vitamin D insufficiency as a level between 21 and 29 ng/mL (2). 1. IOM  (Institute of Medicine). 2010. Dietary reference    intakes for calcium and D. [place]: The    [employer]. 2. Holick MF, Binkley Maybee, Bischoff-Ferrari HA, et al.    Evaluation, treatment, and prevention of vitamin D    deficiency: an Endocrine Society clinical practice    guideline. JCEM. 2011 Jul; 96(7):1911-30.    Thyroid tests are normal.  We will continue the current regimen. The level is normal.  We will continue the current vitamin D supplement dose.  Philemon Kingdom, MD PhD [facility] Endocrinology

## 2020-07-02 LAB — VITAMIN D 25 HYDROXY (VIT D DEFICIENCY, FRACTURES): Vit D, 25-Hydroxy: 32.2 ng/mL (ref 30.0–100.0)

## 2020-07-26 ENCOUNTER — Ambulatory Visit: Payer: Federal, State, Local not specified - PPO | Admitting: Family Medicine

## 2020-07-26 ENCOUNTER — Encounter: Payer: Self-pay | Admitting: Family Medicine

## 2020-07-26 ENCOUNTER — Other Ambulatory Visit: Payer: Self-pay

## 2020-07-26 VITALS — BP 128/78 | HR 86 | Temp 97.6°F | Ht 63.0 in | Wt 134.0 lb

## 2020-07-26 DIAGNOSIS — M79645 Pain in left finger(s): Secondary | ICD-10-CM | POA: Diagnosis not present

## 2020-07-26 DIAGNOSIS — R5383 Other fatigue: Secondary | ICD-10-CM

## 2020-07-26 LAB — FERRITIN: Ferritin: 22 ng/mL (ref 16–232)

## 2020-07-26 LAB — CBC WITH DIFFERENTIAL/PLATELET
Absolute Monocytes: 647 cells/uL (ref 200–950)
Basophils Absolute: 42 cells/uL (ref 0–200)
Basophils Relative: 0.8 %
Eosinophils Absolute: 122 cells/uL (ref 15–500)
Eosinophils Relative: 2.3 %
HCT: 40.1 % (ref 35.0–45.0)
Hemoglobin: 13.1 g/dL (ref 11.7–15.5)
Lymphs Abs: 2200 cells/uL (ref 850–3900)
MCH: 28.5 pg (ref 27.0–33.0)
MCHC: 32.7 g/dL (ref 32.0–36.0)
MCV: 87.2 fL (ref 80.0–100.0)
MPV: 10.4 fL (ref 7.5–12.5)
Monocytes Relative: 12.2 %
Neutro Abs: 2290 cells/uL (ref 1500–7800)
Neutrophils Relative %: 43.2 %
Platelets: 330 10*3/uL (ref 140–400)
RBC: 4.6 10*6/uL (ref 3.80–5.10)
RDW: 13 % (ref 11.0–15.0)
Total Lymphocyte: 41.5 %
WBC: 5.3 10*3/uL (ref 3.8–10.8)

## 2020-07-26 NOTE — Patient Instructions (Signed)
Good to see you today  For left thumb- can also take 2 ibuprofen or 1 ibuprofen and 1 Tylenol, can also use over the counter Voltaren gel, over the counter splint  Youtube- yoga with Vincente Liberty

## 2020-07-26 NOTE — Progress Notes (Signed)
   Subjective:    Patient ID: Nicole Henry, female    DOB: [birth year], 59 y.o.   MRN: [id number]  HPI Chief Complaint  Patient presents with  . Transitions Of Care   This is a 59 yo female who presents today to establish care. Works with Counselling psychologist, has two sets of sick parents.    Last CPE- sees gyn Mammo- in gyn office Pap- gyn Colonoscopy- 06/17/2012 Tdap- 01/20/2018 Flu-annual Covid 19 vaccine- fully vaccinated Eye- regular Dental- regular Exercise- some walking  Fatigue- for weeks, sleeping well, stress good, busy lately. Not menstruating.   Pain at base of left thumb for several months. Off and on, occasionally feels weak, has not tried any medication, bracing, heat or ice.    Review of Systems Denies headache, visual change, chest pain, SOB, abdominal pain, diarrhea/ constipation, dysuria, frequency, leg swelling    Objective:   Physical Exam Vitals reviewed.  Constitutional:      Appearance: Normal appearance. She is normal weight.  HENT:     Head: Normocephalic and atraumatic.  Cardiovascular:     Rate and Rhythm: Normal rate.  Pulmonary:     Effort: Pulmonary effort is normal.  Musculoskeletal:        General: Tenderness (base of left thumb) present.     Cervical back: Normal range of motion and neck supple.     Right lower leg: No edema.     Left lower leg: No edema.  Skin:    General: Skin is warm and dry.  Neurological:     Mental Status: She is alert and oriented to person, place, and time.  Psychiatric:        Mood and Affect: Mood normal.        Behavior: Behavior normal.        Thought Content: Thought content normal.        Judgment: Judgment normal.       BP 128/78   Pulse 86   Temp 97.6 F (36.4 C)   Ht 5\' 3"  (1.6 m)   Wt 134 lb (60.8 kg)   SpO2 99%   BMI 23.74 kg/m  Wt Readings from Last 3 Encounters:  07/26/20 134 lb (60.8 kg)  07/01/20 134 lb (60.8 kg)  05/15/20 134 lb 12.8 oz (61.1 kg)       Assessment & Plan:  1. Fatigue,  unspecified type - CBC with Differential - Ferritin  2. Thumb pain, left - discussed bracing, oral and topical NSAIDs, referral to hand specialist - she will try conservative treatment and let me know if she desires referral   Clarene Reamer, FNP-BC  [facility] [place]  07/28/2020 9:27 PM

## 2020-07-28 ENCOUNTER — Encounter: Payer: Self-pay | Admitting: Family Medicine

## 2020-08-21 DIAGNOSIS — L820 Inflamed seborrheic keratosis: Secondary | ICD-10-CM | POA: Diagnosis not present

## 2020-08-21 DIAGNOSIS — Z85828 Personal history of other malignant neoplasm of skin: Secondary | ICD-10-CM | POA: Diagnosis not present

## 2020-08-21 DIAGNOSIS — D1801 Hemangioma of skin and subcutaneous tissue: Secondary | ICD-10-CM | POA: Diagnosis not present

## 2020-08-21 DIAGNOSIS — B078 Other viral warts: Secondary | ICD-10-CM | POA: Diagnosis not present

## 2020-08-21 DIAGNOSIS — L821 Other seborrheic keratosis: Secondary | ICD-10-CM | POA: Diagnosis not present

## 2020-10-03 DIAGNOSIS — N952 Postmenopausal atrophic vaginitis: Secondary | ICD-10-CM | POA: Diagnosis not present

## 2020-10-03 DIAGNOSIS — R8290 Unspecified abnormal findings in urine: Secondary | ICD-10-CM | POA: Diagnosis not present

## 2020-10-03 DIAGNOSIS — Z1231 Encounter for screening mammogram for malignant neoplasm of breast: Secondary | ICD-10-CM | POA: Diagnosis not present

## 2020-10-03 DIAGNOSIS — Z130 Encounter for screening for diseases of the blood and blood-forming organs and certain disorders involving the immune mechanism: Secondary | ICD-10-CM | POA: Diagnosis not present

## 2020-10-03 DIAGNOSIS — Z124 Encounter for screening for malignant neoplasm of cervix: Secondary | ICD-10-CM | POA: Diagnosis not present

## 2020-10-03 DIAGNOSIS — Z1151 Encounter for screening for human papillomavirus (HPV): Secondary | ICD-10-CM | POA: Diagnosis not present

## 2020-10-03 DIAGNOSIS — Z01419 Encounter for gynecological examination (general) (routine) without abnormal findings: Secondary | ICD-10-CM | POA: Diagnosis not present

## 2020-10-03 LAB — HM PAP SMEAR

## 2020-10-14 ENCOUNTER — Other Ambulatory Visit: Payer: Self-pay | Admitting: Obstetrics and Gynecology

## 2020-10-14 DIAGNOSIS — R928 Other abnormal and inconclusive findings on diagnostic imaging of breast: Secondary | ICD-10-CM

## 2020-10-23 ENCOUNTER — Other Ambulatory Visit: Payer: Federal, State, Local not specified - PPO

## 2020-10-25 ENCOUNTER — Encounter: Payer: Self-pay | Admitting: Family Medicine

## 2020-10-26 ENCOUNTER — Ambulatory Visit
Admission: RE | Admit: 2020-10-26 | Discharge: 2020-10-26 | Disposition: A | Discharge status: Home / Self Care | Payer: Federal, State, Local not specified - PPO | Source: Ambulatory Visit | Attending: Obstetrics and Gynecology | Admitting: Obstetrics and Gynecology

## 2020-10-26 ENCOUNTER — Other Ambulatory Visit: Payer: Self-pay

## 2020-10-26 ENCOUNTER — Other Ambulatory Visit: Payer: Self-pay | Admitting: Obstetrics and Gynecology

## 2020-10-26 DIAGNOSIS — R928 Other abnormal and inconclusive findings on diagnostic imaging of breast: Secondary | ICD-10-CM

## 2020-10-26 DIAGNOSIS — N6001 Solitary cyst of right breast: Secondary | ICD-10-CM | POA: Diagnosis not present

## 2020-10-26 DIAGNOSIS — R922 Inconclusive mammogram: Secondary | ICD-10-CM | POA: Diagnosis not present

## 2020-10-30 ENCOUNTER — Encounter: Payer: Self-pay | Admitting: Family Medicine

## 2021-03-04 DIAGNOSIS — L820 Inflamed seborrheic keratosis: Secondary | ICD-10-CM | POA: Diagnosis not present

## 2021-04-23 ENCOUNTER — Other Ambulatory Visit: Payer: Self-pay | Admitting: Internal Medicine

## 2021-04-24 ENCOUNTER — Other Ambulatory Visit: Payer: Self-pay | Admitting: Obstetrics and Gynecology

## 2021-04-24 DIAGNOSIS — R928 Other abnormal and inconclusive findings on diagnostic imaging of breast: Secondary | ICD-10-CM

## 2021-04-25 ENCOUNTER — Telehealth: Payer: Self-pay

## 2021-04-25 NOTE — Telephone Encounter (Signed)
Called and left a message for pt to call back regarding paperwork. Referral for diagnostic study needs to be completed by PCP or GYN.

## 2021-04-28 ENCOUNTER — Ambulatory Visit
Admission: RE | Admit: 2021-04-28 | Discharge: 2021-04-28 | Disposition: A | Discharge status: Home / Self Care | Payer: Federal, State, Local not specified - PPO | Source: Ambulatory Visit | Attending: Obstetrics and Gynecology | Admitting: Obstetrics and Gynecology

## 2021-04-28 ENCOUNTER — Other Ambulatory Visit: Payer: Self-pay

## 2021-04-28 ENCOUNTER — Other Ambulatory Visit: Payer: Self-pay | Admitting: Obstetrics and Gynecology

## 2021-04-28 DIAGNOSIS — N6311 Unspecified lump in the right breast, upper outer quadrant: Secondary | ICD-10-CM | POA: Diagnosis not present

## 2021-04-28 DIAGNOSIS — R928 Other abnormal and inconclusive findings on diagnostic imaging of breast: Secondary | ICD-10-CM

## 2021-04-28 DIAGNOSIS — N6313 Unspecified lump in the right breast, lower outer quadrant: Secondary | ICD-10-CM | POA: Diagnosis not present

## 2021-06-19 DIAGNOSIS — N9089 Other specified noninflammatory disorders of vulva and perineum: Secondary | ICD-10-CM | POA: Diagnosis not present

## 2021-06-19 DIAGNOSIS — N941 Unspecified dyspareunia: Secondary | ICD-10-CM | POA: Diagnosis not present

## 2021-06-19 DIAGNOSIS — R350 Frequency of micturition: Secondary | ICD-10-CM | POA: Diagnosis not present

## 2021-07-01 ENCOUNTER — Other Ambulatory Visit (INDEPENDENT_AMBULATORY_CARE_PROVIDER_SITE_OTHER): Payer: Federal, State, Local not specified - PPO

## 2021-07-01 ENCOUNTER — Other Ambulatory Visit: Payer: Self-pay

## 2021-07-01 ENCOUNTER — Ambulatory Visit: Payer: Federal, State, Local not specified - PPO | Admitting: Internal Medicine

## 2021-07-01 ENCOUNTER — Encounter: Payer: Self-pay | Admitting: Internal Medicine

## 2021-07-01 VITALS — BP 128/88 | HR 73 | Ht 63.0 in | Wt 140.0 lb

## 2021-07-01 DIAGNOSIS — Z833 Family history of diabetes mellitus: Secondary | ICD-10-CM

## 2021-07-01 DIAGNOSIS — R7309 Other abnormal glucose: Secondary | ICD-10-CM

## 2021-07-01 DIAGNOSIS — M810 Age-related osteoporosis without current pathological fracture: Secondary | ICD-10-CM | POA: Diagnosis not present

## 2021-07-01 DIAGNOSIS — E038 Other specified hypothyroidism: Secondary | ICD-10-CM | POA: Diagnosis not present

## 2021-07-01 DIAGNOSIS — E063 Autoimmune thyroiditis: Secondary | ICD-10-CM

## 2021-07-01 DIAGNOSIS — E559 Vitamin D deficiency, unspecified: Secondary | ICD-10-CM | POA: Diagnosis not present

## 2021-07-01 LAB — BASIC METABOLIC PANEL
BUN: 13 mg/dL (ref 6–23)
CO2: 29 mEq/L (ref 19–32)
Calcium: 9.2 mg/dL (ref 8.4–10.5)
Chloride: 104 mEq/L (ref 96–112)
Creatinine, Ser: 0.83 mg/dL (ref 0.40–1.20)
GFR: 77.03 mL/min (ref >60.00)
Glucose, Bld: 99 mg/dL (ref 70–99)
Potassium: 4.4 mEq/L (ref 3.5–5.1)
Sodium: 140 mEq/L (ref 135–145)

## 2021-07-01 LAB — T4, FREE: Free T4: 0.96 ng/dL (ref 0.60–1.60)

## 2021-07-01 LAB — TSH: TSH: 4.49 u[IU]/mL (ref 0.35–5.50)

## 2021-07-01 LAB — HEMOGLOBIN A1C: Hgb A1c MFr Bld: 6 % (ref 4.6–6.5)

## 2021-07-01 NOTE — Patient Instructions (Addendum)
Please stop at the lab.  Please continue Synthroid 75 alternating with 100 mcg every other day.  Take the thyroid hormone every day, with water, at least 30 minutes before breakfast, separated by at least 4 hours from: - acid reflux medications - calcium - iron - multivitamins  Please come back for a follow-up appointment in 1 year.

## 2021-07-01 NOTE — Progress Notes (Signed)
Patient ID: Nicole Henry, female   DOB: [birth year], 60 y.o.   MRN: [id number]   This visit occurred during the SARS-CoV-2 public health emergency.  Safety protocols were in place, including screening questions prior to the visit, additional usage of staff PPE, and extensive cleaning of exam room while observing appropriate contact time as indicated for disinfecting solutions.   HPI  Nicole Henry is a 60 y.o.-year-old female, returning for follow-up for uncontrolled Hashimoto's hypothyroidism, vitamin D deficiency, osteoporosis. Last visit 1 year ago.  Interim history: She had Covid19 in 10/2020 >> a severe case, despite taking the booster. She had sxs of a UTI, but culture negative >> is going to start compounded estradiol preparation.  She previously tried a regular estradiol tablet and had a reaction to the dyes. Otherwise, her hair loss, constipation, and fatigue are all resolved.  Reviewed history: Pt. has been dx with hypothyroidism in 2009 (fatigue, TSH 16) >> started on Synthroid d.a.w.  She cannot take levothyroxine with dyes, therefore, she is on white, 50 mcg, tablets  She is currently on Synthroid d.a.w. 100 alternating with 75 mcg every other daily (2 alt. With 1.5 tablet of the 50 mcg dose every other day). Lower dose: 4x a week, higher dose: 3x a week.   She takes the thyroid hormone: - in am - fasting - at least 30 min from b'fast - no Ca, Fe - + MVI, PPIs -later in the day-not taking these currently - not on Biotin  TFTs reviewed: Lab Results  Component Value Date   TSH 2.50 07/01/2020   TSH 0.12 (L) 09/28/2019   TSH 11.15 (H) 07/03/2019   TSH 0.49 12/29/2018   TSH 0.48 11/21/2018   TSH 0.08 (L) 10/03/2018   TSH 32.97 (H) 06/27/2018   TSH 0.10 (L) 03/08/2018   TSH 6.56 (H) 01/20/2018   TSH 8.81 (H) 11/09/2017   FREET4 0.60 07/01/2020   FREET4 1.26 09/28/2019   FREET4 0.70 07/03/2019   FREET4 1.00 12/29/2018   FREET4 0.80 11/21/2018   FREET4 1.10  10/03/2018   FREET4 0.59 (L) 06/27/2018   FREET4 0.84 03/08/2018   FREET4 0.85 01/20/2018   FREET4 0.90 09/15/2017   T3FREE 2.8 03/08/2018   T3FREE 2.7 07/25/2014   She had elevated antithyroid antibodies: Lab Results  Component Value Date   THGAB 9 (H) 11/21/2018   THGAB 9 (H) 06/27/2018   Component     Latest Ref Rng & Units 06/27/2018 11/21/2018  Thyroperoxidase Ab SerPl-aCnc     0 - 34 IU/mL 127 (H) 61 (H)   No FH of thyroid cancer. No h/o radiation tx to head or neck.  No herbal supplements. No Biotin use. No recent steroids use.   Vitamin D deficiency:  Reviewed vitamin D levels: Lab Results  Component Value Date   VD25OH 32.2 07/01/2020   VD25OH 36.53 07/03/2019   VD25OH 26.82 (L) 11/21/2018   VD25OH 30.35 10/03/2018   VD25OH 23.38 (L) 06/27/2018   She was on 5000 units vitamin D3 (stopped few weeks ago) + a multivitamin daily (stopped).  Osteoporosis:  Reviewed previous bone density reports: DXA (09/01/2018) - [facility]: RFN 09/01/2018        -2.4   RFN 06/29/2016        -2.5  LFN 09/01/2018       -2.3 LFN 06/29/2016       -2.2  AP Spine  L1-L4      09/01/2018    56.8         -  1.4    1.015 g/cm2 (+1%) AP Spine  L1-L4      06/29/2016    54.6         -1.5    1.005 g/cm2  DualFemur Total Mean 09/01/2018    56.8         -1.3    0.838 g/cm2 DualFemur Total Mean 06/29/2016    54.6         -1.5    0.824 g/cm2  FRAX: Major osteoporosis fracture risk over 10 years: 9.5% Hip fracture risk over 10 years: 1.6%  Stable T-scores, in the osteopenic range.  She continues to walk and run.  She also has a history of hyperlipidemia.  ROS: Constitutional: no weight gain/no weight loss, resolved fatigue, no subjective hyperthermia, no subjective hypothermia Eyes: no blurry vision ENT: no sore throat, + see HPI Cardiovascular: no CP/no SOB/no palpitations/no leg swelling Respiratory: no cough/no SOB/no wheezing Gastrointestinal: no N/no V/no D/resolved C/no acid  reflux Musculoskeletal: no muscle aches/no joint aches Skin: no rashes, no hair loss Neurological: no tremors/no numbness/no tingling/no dizziness  I reviewed pt's medications, allergies, PMH, social hx, family hx, and changes were documented in the history of present illness. Otherwise, unchanged from my initial visit note.  Past Medical History:  Diagnosis Date   Allergic rhinitis due to pollen    Basal cell carcinoma    history of   Family history of diabetes mellitus    GERD (gastroesophageal reflux disease)    Hypothyroidism    Pure hypercholesterolemia    Rosacea    Screening for lipoid disorders    Past Surgical History:  Procedure Laterality Date   AUGMENTATION MAMMAPLASTY Bilateral    BREAST ENHANCEMENT SURGERY  1997   Social History   Socioeconomic History   Marital status: Married    Spouse name: Not on file   Number of children: 2   Years of education: Not on file   Highest education level: Not on file  Occupational History    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   Diet- fruits and veggies.   Social Determinants of Health   Financial Resource Strain: Not on file  Food Insecurity: Not on file  Transportation Needs: Not on file  Physical Activity: Not on file  Stress: Not on file  Social Connections: Not on file  Intimate Partner Violence: Not on file   Current Outpatient Medications on File Prior to Visit  Medication Sig Dispense Refill   sertraline (ZOLOFT) 50 MG tablet TAKE 1 TABLET BY MOUTH EVERY DAY 90 tablet 3   SYNTHROID 50 MCG tablet Take 1.5 tablets alternating with 2 tablets by mouth every other day before breakfast 150 tablet 3   No current  facility-administered medications on file prior to visit.   Allergies  Allergen Reactions   Ciprofloxacin    Nitrofurantoin Other (See Comments)    unknown   Simvastatin     myaglias   Penicillins Rash   Sulfonamide Derivatives Rash   Yellow Dyes (Non-Tartrazine) Rash   Family History  Problem Relation Age of Onset   Hypertension Father    Prostate cancer Father  Heart disease Brother        ? heart issue   Heart attack Paternal Grandfather        late age    PE: BP 128/88 (BP Location: Right Arm, Patient Position: Sitting, Cuff Size: Normal)   Pulse 73   Ht 5\' 3"  (1.6 m)   Wt 140 lb (63.5 kg)   SpO2 98%   BMI 24.80 kg/m  Wt Readings from Last 3 Encounters:  07/01/21 140 lb (63.5 kg)  07/26/20 134 lb (60.8 kg)  07/01/20 134 lb (60.8 kg)   Constitutional: normal weight, in NAD Eyes: PERRLA, EOMI, no exophthalmos ENT: moist mucous membranes, no thyromegaly, no cervical lymphadenopathy Cardiovascular: RRR, No MRG Respiratory: CTA B Gastrointestinal: abdomen soft, NT, ND, BS+ Musculoskeletal: no deformities, strength intact in all 4 Skin: moist, warm, no rashes Neurological: no tremor with outstretched hands, DTR normal in all 4  ASSESSMENT: 1. Hypothyroidism due to Hashimoto's thyroiditis  2. Osteoporosis   3. Vit D def  PLAN:  1.  Patient with longstanding hypothyroidism, on Synthroid brand name, 50 mcg tablets due to the lack of dyes. - latest thyroid labs reviewed with pt. >> normal: Lab Results  Component Value Date   TSH 2.50 07/01/2020  - she continues on Synthroid d.a.w. 75 mcg 4/7 days and 100 mcg 3/7 days - pt feels good on this dose. - we discussed about taking the thyroid hormone every day, with water, >30 minutes before breakfast, separated by >4 hours from acid reflux medications, calcium, iron, multivitamins. Pt. is taking it correctly. - will check thyroid tests today: TSH and fT4 - If labs are abnormal, she will need to return for repeat  TFTs in 1.5 months  2. Osteoporosis  -Reviewed previous bone density reports.  At last check, in 2019, the T-scores were stable/slightly improved -She continues to walk and run and I previously also suggested weightbearing exercises. -We did discuss about different medication classes, benefits, and side effects.  She discussed about this with her dentist and he strongly discouraged the use... -We are managing her expectantly but she needs another bone density scan.  We will order today.  3.  Vitamin D deficiency -She was on 5000 units vitamin D daily + a multivitamin.  However, she stopped these few weeks ago after she had a reaction to dyes.  This was presumably caused by her recently prescribed estradiol, but she stopped all of her supplements also at that time and did not restart. -Latest level was normal at last visit. -We will recheck her level today and most likely will need to restart the supplement.  I suggested to start a capsule, rather than a tablet.  At this visit, she would want me to check her for diabetes/prediabetes as she has family history of breast and also due to her yeast infections.  Component     Latest Ref Rng & Units 07/01/2021  Sodium     135 - 145 mEq/L 140  Potassium     3.5 - 5.1 mEq/L 4.4  Chloride     96 - 112 mEq/L 104  CO2     19 - 32 mEq/L 29  Glucose     70 - 99 mg/dL 99  BUN     6 - 23 mg/dL 13  Creatinine     0.40 - 1.20 mg/dL 0.83  Calcium     8.4 - 10.5 mg/dL 9.2  GFR     >60.00 mL/min 77.03  TSH  0.35 - 5.50 uIU/mL 4.49  T4,Free(Direct)     0.60 - 1.60 ng/dL 0.96  Vitamin D, 25-Hydroxy     30.0 - 100.0 ng/mL 34.3  Hemoglobin A1C     4.6 - 6.5 % 6.0   Higher TSH >> I would suggest to increase the dose of levothyroxine to 100 mcg daily and recheck the tests in 1.5 months. HbA1c is also higher, in the prediabetic range.  We will need to recheck another HbA1c for a diagnosis of prediabetes.  We will do so when she comes back for the  next set of labs.  Philemon Kingdom, MD PhD [facility] Endocrinology

## 2021-07-02 ENCOUNTER — Encounter: Payer: Self-pay | Admitting: Internal Medicine

## 2021-07-02 LAB — VITAMIN D 25 HYDROXY (VIT D DEFICIENCY, FRACTURES): Vit D, 25-Hydroxy: 34.3 ng/mL (ref 30.0–100.0)

## 2021-07-02 MED ORDER — SYNTHROID 50 MCG PO TABS: ORAL_TABLET | ORAL | 3 refills | Status: DC

## 2021-07-29 NOTE — Progress Notes (Signed)
07/30/21 9:05 AM   Nicole Henry February 17, 1961 [id number]  Referring provider:  Paula Compton, MD [address] [place],  [place] [address] Chief Complaint  Patient presents with   Recurrent UTI     HPI: Nicole Henry is a 60 y.o.female who presents today for further patient urinary frequency primarily..  During her 06/19/2021 visit with Tona Sensing, FNP, she had increased frequency of urination and vulval irritation. Her urine culture showed no growth and was otherwise unremarkable.    PVR today was 146 mL.   Urinalysis today showed 11-30  WBC, nitrite negative, and many bacteria   She states she went to her gynecologist for possible UTI. She states her symptoms were burning and frequency, she was put on antibiotics but her urinalysis and culture came back negative.  She had consistent significant symptoms this over the past month which is essentially failed to resolve.  Prior to this, she was essentially normal.  She states she is urinating more frequently at night it increases when laying down, these are new symptoms for her. She feels as though she is not emptying completely at night. She states she took infertility drugs in her 41s which she thinks could be the cause of her nocturia.   She denies swelling, any new medications, constipation, sleep apnea, hematuria, smoking. She does experience vaginal dryness and discomfort with intercourse.   She was given estradiol starter pack Imvexxy which she has not started.   She mentions today that she did take some pretty "strong" infertility meds as a young adult and always worries about GYN malignancy in the back of her head.  PMH: Past Medical History:  Diagnosis Date   Allergic rhinitis due to pollen    Basal cell carcinoma    history of   Family history of diabetes mellitus    GERD (gastroesophageal reflux disease)    Hypothyroidism    Pure hypercholesterolemia    Rosacea    Screening for lipoid disorders      Surgical History: Past Surgical History:  Procedure Laterality Date   AUGMENTATION MAMMAPLASTY Bilateral    BREAST ENHANCEMENT SURGERY  1997    Home Medications:  Allergies as of 07/30/2021       Reactions   Ciprofloxacin    Nitrofurantoin Other (See Comments)   unknown   Simvastatin    myaglias   Penicillins Rash   Sulfonamide Derivatives Rash   Yellow Dyes (non-tartrazine) Rash        Medication List        Accurate as of July 30, 2021  9:05 AM. If you have any questions, ask your nurse or doctor.          Imvexxy Starter Pack 4 MCG Inst Generic drug: Estradiol Starter Pack Imvexxy Starter Pack 4 mcg vaginal insert, dose pack  INSERT 1 VAGINAL INSERT (4 MCG) BY VAGINAL ROUTE ONCE DAILY FOR 2 WEEKS THEN 1 INSERT (4 MCG) TWICE WEEKLY FOR DURATION OF USE   sertraline 50 MG tablet Commonly known as: ZOLOFT TAKE 1 TABLET BY MOUTH EVERY DAY   Synthroid 50 MCG tablet Generic drug: levothyroxine Take 2 tablets by mouth every day before breakfast        Allergies:  Allergies  Allergen Reactions   Ciprofloxacin    Nitrofurantoin Other (See Comments)    unknown   Simvastatin     myaglias   Penicillins Rash   Sulfonamide Derivatives Rash   Yellow Dyes (Non-Tartrazine) Rash  Family History: Family History  Problem Relation Age of Onset   Hypertension Father    Prostate cancer Father    Heart disease Brother        ? heart issue   Heart attack Paternal Grandfather        late age   Bladder Cancer Neg Hx    Kidney cancer Neg Hx     Social History:  reports that she has never smoked. She has never used smokeless tobacco. She reports that she does not drink alcohol and does not use drugs.   Physical Exam: BP 139/77   Pulse 81   Ht '5\' 3"'$  (1.6 m)   Wt 136 lb (61.7 kg)   BMI 24.09 kg/m   Constitutional:  Alert and oriented, No acute distress. HEENT: [place] AT, moist mucus membranes.  Trachea midline, no masses. Cardiovascular: No clubbing,  cyanosis, or edema. Respiratory: Normal respiratory effort, no increased work of breathing. Skin: No rashes, bruises or suspicious lesions. Neurologic: Grossly intact, no focal deficits, moving all 4 extremities. Psychiatric: Normal mood and affect.  Laboratory Data:  Lab Results  Component Value Date   CREATININE 0.83 07/01/2021    Lab Results  Component Value Date   HGBA1C 6.0 07/01/2021    Urinalysis 11-30 WBC, nitrite negative, and many bacteria   Pertinent Imaging: Results for orders placed or performed in visit on 07/30/21  BLADDER SCAN AMB NON-IMAGING  Result Value Ref Range   Scan Result 15m     Assessment & Plan:   Urinary Frequency Nocturia  - PVR 146 mL  - Urinalysis today showed 11-30 WBC, and many bacteria but was otherwise unremarkable   - Will further investigate this with UCx, microplasma, uroplamsa to further rule out infection - Myrebetriq 25 mg prescribed for OAB to treat symptomatically for the time being, prefers to avoid anticholinergic due to side effects - given that symptoms are relatively acute and onset with relative pelvic pressure its reasonable to pursue a pelvic exam/ cysto  - Pelvic transvaginal ultrasound to  to rule out pelvic neoplasm   2. Pelvic pressure pain  - See above  3. Vaginal dryness  - Agree with OB/GYN to start Imvexxy starter pack for prevention and vaginal dryness.    Follow-up with pelvic transvaginal ultrasound / cysto  I,Kailey Littlejohn,acting as a scribe for AHollice Espy MD.,have documented all relevant documentation on the behalf of AHollice Espy MD,as directed by  AHollice Espy MD while in the presence of AHollice Espy [address] [place] [phone] I have reviewed the above documentation for accuracy and completeness, and I agree with the above.   AHollice Espy MD

## 2021-07-30 ENCOUNTER — Other Ambulatory Visit: Payer: Self-pay

## 2021-07-30 ENCOUNTER — Encounter: Payer: Self-pay | Admitting: Urology

## 2021-07-30 ENCOUNTER — Ambulatory Visit: Payer: Federal, State, Local not specified - PPO | Admitting: Urology

## 2021-07-30 VITALS — BP 139/77 | HR 81 | Ht 63.0 in | Wt 136.0 lb

## 2021-07-30 DIAGNOSIS — R102 Pelvic and perineal pain: Secondary | ICD-10-CM | POA: Diagnosis not present

## 2021-07-30 DIAGNOSIS — N390 Urinary tract infection, site not specified: Secondary | ICD-10-CM

## 2021-07-30 LAB — BLADDER SCAN AMB NON-IMAGING

## 2021-07-30 MED ORDER — MIRABEGRON ER 25 MG PO TB24: 25.0000 mg | ORAL_TABLET | Freq: Every day | ORAL | 1 refills | Status: DC

## 2021-07-30 NOTE — Patient Instructions (Signed)
Cystoscopy Cystoscopy is a procedure that is used to help diagnose and sometimes treat conditions that affect the lower urinary tract. The lower urinary tract includes the bladder and the urethra. The urethra is the tube that drains urine from the bladder. Cystoscopy is done using a thin, tube-shaped instrument with a light and camera at the end (cystoscope). The cystoscope may be hard or flexible, depending on the goal of the procedure. The cystoscope is inserted through the urethra, into the bladder. Cystoscopy may be recommended if you have: Urinary tract infections that keep coming back. Blood in the urine (hematuria). An inability to control when you urinate (urinary incontinence) or an overactive bladder. Unusual cells found in a urine sample. A blockage in the urethra, such as a urinary stone. Painful urination. An abnormality in the bladder found during an intravenous pyelogram (IVP) or CT scan. Cystoscopy may also be done to remove a sample of tissue to be examined under a microscope (biopsy). What are the risks? Generally, this is a safe procedure. However, problems may occur, including: Infection. Bleeding.  What happens during the procedure?  You will be given one or more of the following: A medicine to numb the area (local anesthetic). The area around the opening of your urethra will be cleaned. The cystoscope will be passed through your urethra into your bladder. Germ-free (sterile) fluid will flow through the cystoscope to fill your bladder. The fluid will stretch your bladder so that your health care provider can clearly examine your bladder walls. Your doctor will look at the urethra and bladder. The cystoscope will be removed The procedure may vary among health care providers  What can I expect after the procedure? After the procedure, it is common to have: Some soreness or pain in your abdomen and urethra. Urinary symptoms. These include: Mild pain or burning when you  urinate. Pain should stop within a few minutes after you urinate. This may last for up to 1 week. A small amount of blood in your urine for several days. Feeling like you need to urinate but producing only a small amount of urine. Follow these instructions at home: General instructions Return to your normal activities as told by your health care provider.  Do not drive for 24 hours if you were given a sedative during your procedure. Watch for any blood in your urine. If the amount of blood in your urine increases, call your health care provider. If a tissue sample was removed for testing (biopsy) during your procedure, it is up to you to get your test results. Ask your health care provider, or the department that is doing the test, when your results will be ready. Drink enough fluid to keep your urine pale yellow. Keep all follow-up visits as told by your health care provider. This is important. Contact a health care provider if you: Have pain that gets worse or does not get better with medicine, especially pain when you urinate. Have trouble urinating. Have more blood in your urine. Get help right away if you: Have blood clots in your urine. Have abdominal pain. Have a fever or chills. Are unable to urinate. Summary Cystoscopy is a procedure that is used to help diagnose and sometimes treat conditions that affect the lower urinary tract. Cystoscopy is done using a thin, tube-shaped instrument with a light and camera at the end. After the procedure, it is common to have some soreness or pain in your abdomen and urethra. Watch for any blood in your urine.   If the amount of blood in your urine increases, call your health care provider. If you were prescribed an antibiotic medicine, take it as told by your health care provider. Do not stop taking the antibiotic even if you start to feel better. This information is not intended to replace advice given to you by your health care provider. Make  sure you discuss any questions you have with your health care provider. Document Revised: 11/22/2018 Document Reviewed: 11/22/2018 Elsevier Patient Education  2020 Elsevier Inc.  

## 2021-07-31 ENCOUNTER — Encounter: Payer: Self-pay | Admitting: Obstetrics and Gynecology

## 2021-07-31 LAB — MICROSCOPIC EXAMINATION
Epithelial Cells (non renal): NONE SEEN /HPF (ref 0–10)
RBC, Urine: NONE SEEN /HPF (ref 0–2)

## 2021-07-31 LAB — URINALYSIS, COMPLETE
Bilirubin, UA: NEGATIVE
Glucose, UA: NEGATIVE
Ketones, UA: NEGATIVE
Nitrite, UA: NEGATIVE
Protein,UA: NEGATIVE
RBC, UA: NEGATIVE
Specific Gravity, UA: 1.01 (ref 1.005–1.030)
Urobilinogen, Ur: 0.2 mg/dL (ref 0.2–1.0)
pH, UA: 7 (ref 5.0–7.5)

## 2021-08-02 LAB — CULTURE, URINE COMPREHENSIVE

## 2021-08-04 ENCOUNTER — Telehealth: Payer: Self-pay | Admitting: *Deleted

## 2021-08-04 MED ORDER — CEPHALEXIN 500 MG PO CAPS: 500.0000 mg | ORAL_CAPSULE | Freq: Three times a day (TID) | ORAL | 0 refills | Status: DC

## 2021-08-04 NOTE — Telephone Encounter (Addendum)
Patient informed, voiced understanding. Sent in Keflex to [facility].  Patient states has a reaction to Myrbetriq-yellow dye in medication causes itching and a rash-any other alternatives?    ----- Message from Hollice Espy, MD sent at 08/03/2021 10:01 AM EDT ----- There is bacteria in the urine, Klebsiella.  This may explain some of her urinary symptoms.  Lets go ahead and treat her with a prolonged course of Keflex, 500 mg 3 times a day for 7 days given her current symptoms.  Cross-reactivity with penicillin allergy is low this we will proceed with this medication.  Hollice Espy, MD

## 2021-08-04 NOTE — Telephone Encounter (Signed)
Given that she has an acute infection, lets see how the abx work prior to prescribing something different.    Encourage her to start/ continue vaginal estrogen cream.    If urinary symptoms fail to improve with abx, then try Gemetesa 75 mg.  Hollice Espy, MD

## 2021-08-05 LAB — MYCOPLASMA / UREAPLASMA CULTURE
Mycoplasma hominis Culture: NEGATIVE
Ureaplasma urealyticum: NEGATIVE

## 2021-08-05 NOTE — Telephone Encounter (Signed)
Patient informed, voiced understanding.  °

## 2021-08-15 ENCOUNTER — Other Ambulatory Visit: Payer: Self-pay

## 2021-08-15 ENCOUNTER — Ambulatory Visit
Admission: RE | Admit: 2021-08-15 | Discharge: 2021-08-15 | Disposition: A | Discharge status: Home / Self Care | Payer: Federal, State, Local not specified - PPO | Source: Ambulatory Visit | Attending: Urology | Admitting: Urology

## 2021-08-15 DIAGNOSIS — R102 Pelvic and perineal pain: Secondary | ICD-10-CM

## 2021-08-19 ENCOUNTER — Telehealth: Payer: Self-pay | Admitting: *Deleted

## 2021-08-19 NOTE — Telephone Encounter (Addendum)
Patient informed, voiced understanding  ----- Message from Hollice Espy, MD sent at 08/18/2021  2:42 PM EDT ----- Pelvic ultrasound is normal, great news.  Hollice Espy, MD

## 2021-08-19 NOTE — Progress Notes (Incomplete)
   08/19/21  CC: No chief complaint on file.    HPI: Nicole Henry is a 60 y.o.female with a personal history of urinary frequency and pelvic pressure, who presents today for a cystoscopy  and pelvic ultrasound.   During 06/19/2021 visit with Tona Sensing, FNP, she had increased frequency of urination and vulval irritation. Her urine culture at the time showed no growth and was otherwise unremarkable.    Recent urine culture on 07/30/2021 grew Klebsiella pneumoniae. She was given prolonged course of Keflex, 500 mg.   08/15/2021 transabdominal and transvaginal ultrasound of pelvis revealed normal pelvic ultrasound.     There were no vitals filed for this visit. NED. A&Ox3.   No respiratory distress   Abd soft, NT, ND Normal external genitalia with patent urethral meatus  Cystoscopy Procedure Note  Patient identification was confirmed, informed consent was obtained, and patient was prepped using Betadine solution.  Lidocaine jelly was administered per urethral meatus.    Procedure: - Flexible cystoscope introduced, without any difficulty.   - Thorough search of the bladder revealed:    normal urethral meatus    normal urothelium    no stones    no ulcers     no tumors    no urethral polyps    no trabeculation  - Ureteral orifices were normal in position and appearance.  Post-Procedure: - Patient tolerated the procedure well  . Assessment/ Plan: Urinary frequency nocturia  Pelvic pressure pain    No follow-ups on file.  I,Kailey Littlejohn,acting as a Education administrator for Hollice Espy, MD.,have documented all relevant documentation on the behalf of Hollice Espy, MD,as directed by  Hollice Espy, MD while in the presence of Hollice Espy, MD.

## 2021-08-20 ENCOUNTER — Other Ambulatory Visit: Payer: Federal, State, Local not specified - PPO | Admitting: Urology

## 2021-08-21 ENCOUNTER — Ambulatory Visit: Payer: Federal, State, Local not specified - PPO | Admitting: Adult Health

## 2021-09-09 ENCOUNTER — Telehealth: Payer: Self-pay | Admitting: Urology

## 2021-09-09 NOTE — Telephone Encounter (Signed)
Patient requested an app to re-check urine due to persisting symptoms.  If an appt. is not available, requesting another course of antibiotics.I told her we would probably need to reschedule her cysto and she said she does not want to do that, as it seems too aggressive for her and she is open to suggestions. She is requesting a call back. Her number is [phone].

## 2021-09-09 NOTE — Telephone Encounter (Signed)
Spoke with patient regarding urinary symptoms. Denies dysuria or any other symptoms. Did not want to reschedule cysto. Per Dr. Erlene Quan schedule evaluation with PA. Voiced understanding.

## 2021-09-10 NOTE — Progress Notes (Signed)
09/15/21 3:41 PM   Nicole Henry [birth year] [id number]  Referring provider:  Paula Compton, MD [place],  [place] [address]  Chief Complaint  Patient presents with   Urinary Frequency   Urological history: Urinary frequency  - Recent UTI on 07/30/2021 that grew Klebsiella pneumoniae  - mycoplasma and ureaplasma negative  - pelvic ultrasound 08/2021 - NED - managed on Myrbetriq 25 mg   2. Vaginal dryness  - Managed with Imvexxy starter for prevention    HPI: Nicole Henry is a 60 y.o.female who presents today for recheck of symptoms with UA.   Urine today >20 WBCs and moderate bacteria   She reports that she was recently placed on antibiotics that had helped her symptoms but they ended up returning. Her symptoms consist of burning during urination that worsens at night along with frequency and urgency that worsens at night.   Pelvic ultrasound was negative.   She did not take Myrbetriq due to worry that it has yellow dye.   She was offered a cystoscopy by Dr. Erlene Quan, but she declined due to worry of infection.   Patient denies any modifying or aggravating factors.  Patient denies any gross hematuria, or suprapubic/flank pain.  Patient denies any fevers, chills, nausea or vomiting.    PMH: Past Medical History:  Diagnosis Date   Allergic rhinitis due to pollen    Basal cell carcinoma    history of   Family history of diabetes mellitus    GERD (gastroesophageal reflux disease)    Hypothyroidism    Pure hypercholesterolemia    Rosacea    Screening for lipoid disorders     Surgical History: Past Surgical History:  Procedure Laterality Date   AUGMENTATION MAMMAPLASTY Bilateral    BREAST ENHANCEMENT SURGERY  1997    Home Medications:  Allergies as of 09/11/2021       Reactions   Ciprofloxacin    Nitrofurantoin Other (See Comments)   unknown   Simvastatin    myaglias   Penicillins Rash   Sulfonamide Derivatives Rash   Yellow  Dyes (non-tartrazine) Rash        Medication List        Accurate as of September 11, 2021 11:59 PM. If you have any questions, ask your nurse or doctor.          STOP taking these medications    cephALEXin 500 MG capsule Commonly known as: Keflex Stopped by: Zara Council, PA-C       TAKE these medications    Imvexxy Starter Pack 4 MCG Inst Generic drug: Estradiol Starter Pack   mirabegron ER 25 MG Tb24 tablet Commonly known as: MYRBETRIQ Take 1 tablet (25 mg total) by mouth daily.   sertraline 50 MG tablet Commonly known as: ZOLOFT TAKE 1 TABLET BY MOUTH EVERY DAY   Synthroid 50 MCG tablet Generic drug: levothyroxine Take 2 tablets by mouth every day before breakfast        Allergies:  Allergies  Allergen Reactions   Ciprofloxacin    Nitrofurantoin Other (See Comments)    unknown   Simvastatin     myaglias   Penicillins Rash   Sulfonamide Derivatives Rash   Yellow Dyes (Non-Tartrazine) Rash    Family History: Family History  Problem Relation Age of Onset   Hypertension Father    Prostate cancer Father    Heart disease Brother        ? heart issue   Heart attack Paternal Grandfather  late age   Bladder Cancer Neg Hx    Kidney cancer Neg Hx     Social History:  reports that she has never smoked. She has never used smokeless tobacco. She reports that she does not drink alcohol and does not use drugs.   Physical Exam: BP 136/78   Pulse 97   Ht 5\' 3"  (1.6 m)   Wt 136 lb (61.7 kg)   BMI 24.09 kg/m   Constitutional:  Alert and oriented, No acute distress. HEENT: [place] AT, mask in place.  Trachea midline Cardiovascular: No clubbing, cyanosis, or edema. Respiratory: Normal respiratory effort, no increased work of breathing. Neurologic: Grossly intact, no focal deficits, moving all 4 extremities. Psychiatric: Normal mood and affect.  Laboratory Data: Lab Results  Component Value Date   CREATININE 0.83 07/01/2021   Component      Latest Ref Rng & Units 09/28/2019 07/01/2020 07/01/2021  TSH     0.35 - 5.50 uIU/mL 0.12 (L) 2.50 4.49   Component     Latest Ref Rng & Units 07/01/2021  Sodium     135 - 145 mEq/L 140  Potassium     3.5 - 5.1 mEq/L 4.4  Chloride     96 - 112 mEq/L 104  CO2     19 - 32 mEq/L 29  Glucose     70 - 99 mg/dL 99  BUN     6 - 23 mg/dL 13  Creatinine     0.40 - 1.20 mg/dL 0.83  Calcium     8.4 - 10.5 mg/dL 9.2  GFR     >60.00 mL/min 77.03   Component     Latest Ref Rng & Units 07/01/2021  Sodium     135 - 145 mEq/L 140  Potassium     3.5 - 5.1 mEq/L 4.4  Chloride     96 - 112 mEq/L 104  CO2     19 - 32 mEq/L 29  Glucose     70 - 99 mg/dL 99  BUN     6 - 23 mg/dL 13  Creatinine     0.40 - 1.20 mg/dL 0.83  Calcium     8.4 - 10.5 mg/dL 9.2  GFR     >60.00 mL/min 77.03    Lab Results  Component Value Date   HGBA1C 6.0 07/01/2021    Component     Latest Ref Rng & Units 07/30/2021  Ureaplasma urealyticum     Negative Negative  Mycoplasma hominis Culture     Negative Negative   Component     Latest Ref Rng & Units 07/30/2021  Urine Culture, Comprehensive      Final report (A)  Organism ID, Bacteria      Klebsiella pneumoniae (A)  Antimicrobial Susceptibility      Comment   Urinalysis Component     Latest Ref Rng & Units 09/11/2021  Specific Gravity, UA     1.005 - 1.030 1.015  pH, UA     5.0 - 7.5 6.0  Color, UA     Yellow Yellow  Appearance Ur     Clear Hazy (A)  Leukocytes,UA     Negative Trace (A)  Protein,UA     Negative/Trace Negative  Glucose, UA     Negative Negative  Ketones, UA     Negative Negative  RBC, UA     Negative Trace (A)  Bilirubin, UA     Negative Negative  Urobilinogen, Ur     0.2 -  1.0 mg/dL 0.2  Nitrite, UA     Negative Negative  Microscopic Examination      See below:   Component     Latest Ref Rng & Units 09/11/2021  WBC, UA     0 - 5 /hpf >30 (A)  RBC     0 - 2 /hpf 0-2  Epithelial Cells (non renal)     0 -  10 /hpf 0-10  Bacteria, UA     None seen/Few Moderate (A)  I have reviewed the labs.   Pertinent Imaging CLINICAL DATA:  Pelvic pain   EXAM: TRANSABDOMINAL AND TRANSVAGINAL ULTRASOUND OF PELVIS   TECHNIQUE: Both transabdominal and transvaginal ultrasound examinations of the pelvis were performed. Transabdominal technique was performed for global imaging of the pelvis including uterus, ovaries, adnexal regions, and pelvic cul-de-sac. It was necessary to proceed with endovaginal exam following the transabdominal exam to visualize the ovaries and adnexa.   COMPARISON:  None   FINDINGS: Uterus   Measurements: 7.3 x 2.1 x 2.9 cm = volume: 24 mL. No fibroids or other mass visualized.   Endometrium   Thickness: 2 mm.  No focal abnormality visualized.   Right ovary   Measurements: 1.3 x 0.8 x 1.1 cm = volume: 0.6 mL. Normal appearance/no adnexal mass.   Left ovary   Measurements: 0.9 x 0.7 x 0.7 cm = volume: 1.9 mL. Normal appearance/no adnexal mass.   Other findings   No abnormal free fluid.   IMPRESSION: Normal pelvic ultrasound     Electronically Signed   By: Sharen Heck  Mir M.D.   On: 08/15/2021 15:49 I have independently reviewed the films.  See HPI.    Assessment & Plan:    Urinary frequency nocturia  - Urinalysis today showed > 30 WBC's and moderate bacteria - Urine sent for culture to rule out infection; antibiotics will be prescribed if there is growth  - Strongly recommend cystoscopy to rule out malignancy or an underlying disease. - -Scheduled.  -I ran the medication, Myrbetriq, through the Epic system and no cross reactivity was noted with yellow-dye, I did caution that there may be several different types of yellow dye and the type of dye she is allergic to may not be the one listed in her allergies  - she will try the Myrbetriq she has on hand to see if she experiences an allergic reaction  Pelvic pressure pain  - see above   Vaginal dryness  -  Continue imvexxy  -explained the process of vaginal atrophy and how vaginal estrogen cream can slow down the atrophy and in some cases reverse it, but it takes months of consistent use to achieve    Return for cysto .  [facility] [address] [place], [place] [address] [phone]  [facility] as a scribe for [facility], PA-C.,have documented all relevant documentation on the behalf of Elya Diloreto, PA-C,as directed by  [facility], PA-C while in the presence of Rawan Riendeau, PA-C.  I have reviewed the above documentation for accuracy and completeness, and I agree with the above.    Zara Council, PA-C

## 2021-09-11 ENCOUNTER — Other Ambulatory Visit: Payer: Self-pay

## 2021-09-11 ENCOUNTER — Ambulatory Visit: Payer: Federal, State, Local not specified - PPO | Admitting: Urology

## 2021-09-11 ENCOUNTER — Encounter: Payer: Self-pay | Admitting: Urology

## 2021-09-11 VITALS — BP 136/78 | HR 97 | Ht 63.0 in | Wt 136.0 lb

## 2021-09-11 DIAGNOSIS — N952 Postmenopausal atrophic vaginitis: Secondary | ICD-10-CM

## 2021-09-11 DIAGNOSIS — N390 Urinary tract infection, site not specified: Secondary | ICD-10-CM

## 2021-09-11 DIAGNOSIS — R350 Frequency of micturition: Secondary | ICD-10-CM | POA: Diagnosis not present

## 2021-09-11 MED ORDER — GEMTESA 75 MG PO TABS: 75.0000 mg | ORAL_TABLET | Freq: Every day | ORAL | 0 refills | Status: DC

## 2021-09-11 MED ORDER — MIRABEGRON ER 25 MG PO TB24: 25.0000 mg | ORAL_TABLET | Freq: Every day | ORAL | 0 refills | Status: DC

## 2021-09-12 LAB — URINALYSIS, COMPLETE
Bilirubin, UA: NEGATIVE
Glucose, UA: NEGATIVE
Ketones, UA: NEGATIVE
Nitrite, UA: NEGATIVE
Protein,UA: NEGATIVE
Specific Gravity, UA: 1.015 (ref 1.005–1.030)
Urobilinogen, Ur: 0.2 mg/dL (ref 0.2–1.0)
pH, UA: 6 (ref 5.0–7.5)

## 2021-09-12 LAB — MICROSCOPIC EXAMINATION: WBC, UA: >30 /hpf — AB (ref 0–5)

## 2021-09-17 ENCOUNTER — Other Ambulatory Visit: Payer: Self-pay | Admitting: Urology

## 2021-09-17 LAB — CULTURE, URINE COMPREHENSIVE

## 2021-09-17 MED ORDER — CEFUROXIME AXETIL 500 MG PO TABS: 500.0000 mg | ORAL_TABLET | Freq: Two times a day (BID) | ORAL | 0 refills | Status: DC

## 2021-09-23 ENCOUNTER — Other Ambulatory Visit: Payer: Self-pay

## 2021-09-23 ENCOUNTER — Ambulatory Visit: Payer: Federal, State, Local not specified - PPO | Admitting: Urology

## 2021-09-23 VITALS — BP 165/82 | HR 79 | Ht 63.0 in | Wt 136.0 lb

## 2021-09-23 DIAGNOSIS — N952 Postmenopausal atrophic vaginitis: Secondary | ICD-10-CM

## 2021-09-23 DIAGNOSIS — N390 Urinary tract infection, site not specified: Secondary | ICD-10-CM | POA: Diagnosis not present

## 2021-09-23 DIAGNOSIS — R350 Frequency of micturition: Secondary | ICD-10-CM

## 2021-09-23 NOTE — Progress Notes (Signed)
   09/23/21  CC:  Chief Complaint  Patient presents with   Cysto    HPI: 60 year old female with a personal history of urinary frequency/nocturia vaginal dryness and pelvic pressure who presents today for cystoscopy.  In the interim, she underwent pelvic ultrasound which was negative for any underlying pathology.  She is also taking antibiotics for bladder infection.  She has 3 days of this left.  There were no vitals taken for this visit. NED. A&Ox3.   No respiratory distress   Abd soft, NT, ND Normal external genitalia with patent urethral meatus  Cystoscopy Procedure Note  Patient identification was confirmed, informed consent was obtained, and patient was prepped using Betadine solution.  Lidocaine jelly was administered per urethral meatus.    Procedure: - Flexible cystoscope introduced, without any difficulty.   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  There is very mild trigonitis.  Post-Procedure: - Patient tolerated the procedure well  Assessment/ Plan:  1. Recurrent UTI Complete antibiotic course as previously outlined  Agree with estrogen, continue this medication  Cystoscopy today is unremarkable other than for presence of cystitis cystica which is likely related to her recurrent infections as a sequela rather than causative factor - Urinalysis, Complete  2. Vaginal atrophy As above  3. Frequency of urination Elected to complete her course of antibiotics.  If urinary frequency persist, she was given samples of Myrbetriq 25 mg to try.  She is in question today about the yellow dye content which I was unable to answer for her.  I referred her to discuss it with her local pharmacist.    Return if symptoms  worsen or fail to improve.  Hollice Espy, MD

## 2021-09-23 NOTE — Patient Instructions (Signed)
Cystitis cystica is a relatively common chronic reactive inflammatory disorder thought to be caused by chronic irritation of the urothelium because of infection, calculi, or outlet obstruction resulting in multiple small filling defects in the bladder wall.  This may be related to your most recent bladder infections.

## 2021-09-24 LAB — URINALYSIS, COMPLETE
Bilirubin, UA: NEGATIVE
Glucose, UA: NEGATIVE
Ketones, UA: NEGATIVE
Leukocytes,UA: NEGATIVE
Nitrite, UA: NEGATIVE
Protein,UA: NEGATIVE
Specific Gravity, UA: 1.015 (ref 1.005–1.030)
Urobilinogen, Ur: 0.2 mg/dL (ref 0.2–1.0)
pH, UA: 7 (ref 5.0–7.5)

## 2021-09-24 LAB — MICROSCOPIC EXAMINATION
Bacteria, UA: NONE SEEN
Epithelial Cells (non renal): NONE SEEN /hpf (ref 0–10)

## 2021-10-02 ENCOUNTER — Telehealth: Payer: Self-pay | Admitting: Obstetrics and Gynecology

## 2021-10-02 NOTE — Telephone Encounter (Signed)
Patient is requesting a refill on her sertraline (ZOLOFT) 50 MG tablet and was scheduled to see Laverna Peace but she is currently out on medical leave.Patient has been seen at [facility] with Clarene Reamer on 07/26/20.Please advise.

## 2021-10-02 NOTE — Telephone Encounter (Signed)
Patient has tried several times to establish with NP in this office and has had to be rescheduled. Patient really wants to be at this office so she is willing to wait until Sharyn Lull comes back or another provider is attained at this office. Patient has been trying since June to get her Zoloft refilled , is there any way to fill for 90 days until we know if NP will return.

## 2021-10-06 ENCOUNTER — Other Ambulatory Visit: Payer: Self-pay | Admitting: Internal Medicine

## 2021-10-06 DIAGNOSIS — F4322 Adjustment disorder with anxiety: Secondary | ICD-10-CM

## 2021-10-06 MED ORDER — SERTRALINE HCL 50 MG PO TABS: ORAL_TABLET | ORAL | 0 refills | Status: DC

## 2021-10-06 NOTE — Telephone Encounter (Signed)
Left message to return my call.  

## 2021-10-06 NOTE — Telephone Encounter (Signed)
Sch pt with available provider in 3 months She will establish with Sharyn Lull or next provider who takes over [place] panel  Have pt call back in 1-2 months to establish

## 2021-10-13 ENCOUNTER — Ambulatory Visit: Payer: Federal, State, Local not specified - PPO | Admitting: Adult Health

## 2021-10-16 ENCOUNTER — Ambulatory Visit: Payer: Self-pay | Admitting: Adult Health

## 2021-10-30 ENCOUNTER — Other Ambulatory Visit: Payer: Federal, State, Local not specified - PPO

## 2021-11-10 ENCOUNTER — Ambulatory Visit: Payer: Federal, State, Local not specified - PPO | Admitting: Adult Health

## 2021-11-10 ENCOUNTER — Ambulatory Visit: Payer: Self-pay | Admitting: Adult Health

## 2021-11-18 ENCOUNTER — Ambulatory Visit
Admission: RE | Admit: 2021-11-18 | Discharge: 2021-11-18 | Disposition: A | Discharge status: Home / Self Care | Payer: Federal, State, Local not specified - PPO | Source: Ambulatory Visit | Attending: Obstetrics and Gynecology | Admitting: Obstetrics and Gynecology

## 2021-11-18 ENCOUNTER — Other Ambulatory Visit: Payer: Self-pay

## 2021-11-18 DIAGNOSIS — R928 Other abnormal and inconclusive findings on diagnostic imaging of breast: Secondary | ICD-10-CM

## 2021-11-18 DIAGNOSIS — R922 Inconclusive mammogram: Secondary | ICD-10-CM | POA: Diagnosis not present

## 2021-11-18 IMAGING — MG MM DIGITAL DIAGNOSTIC UNILAT*R* W/ TOMO W/ CAD
4 series · 4 of 12 positions shown · non-contrast
Comparison: Previous exams including recent screening mammogram
dated 10/03/2020.

CLINICAL DATA: Patient returns today to evaluate a possible RIGHT
breast mass questioned on recent screening mammogram.

EXAM:
DIGITAL DIAGNOSTIC RIGHT MAMMOGRAM WITH CAD AND TOMO
ULTRASOUND RIGHT BREAST

[R MLO synth-2D]
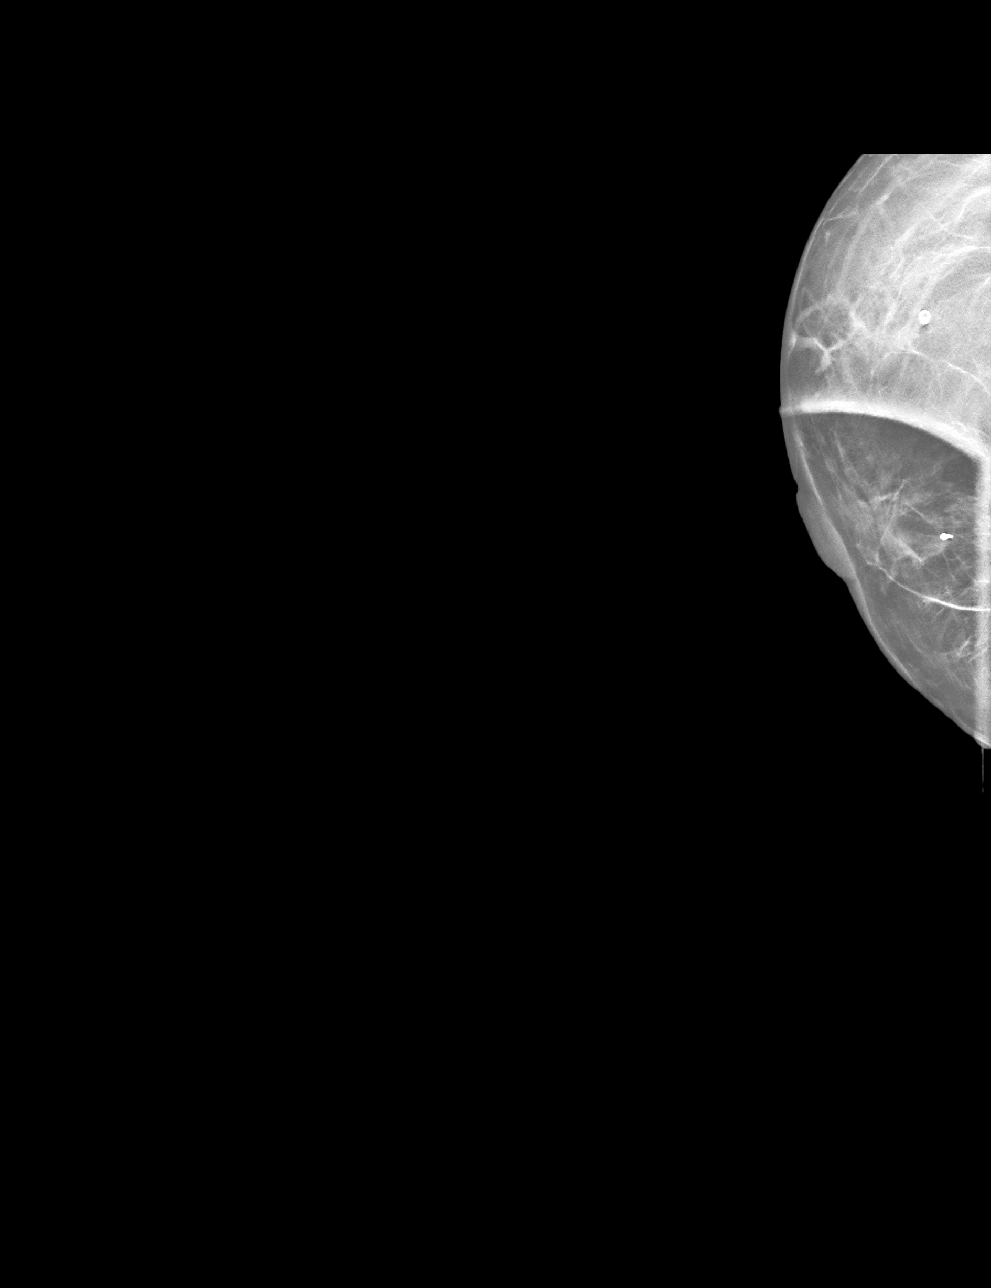

[R CC synth-2D]
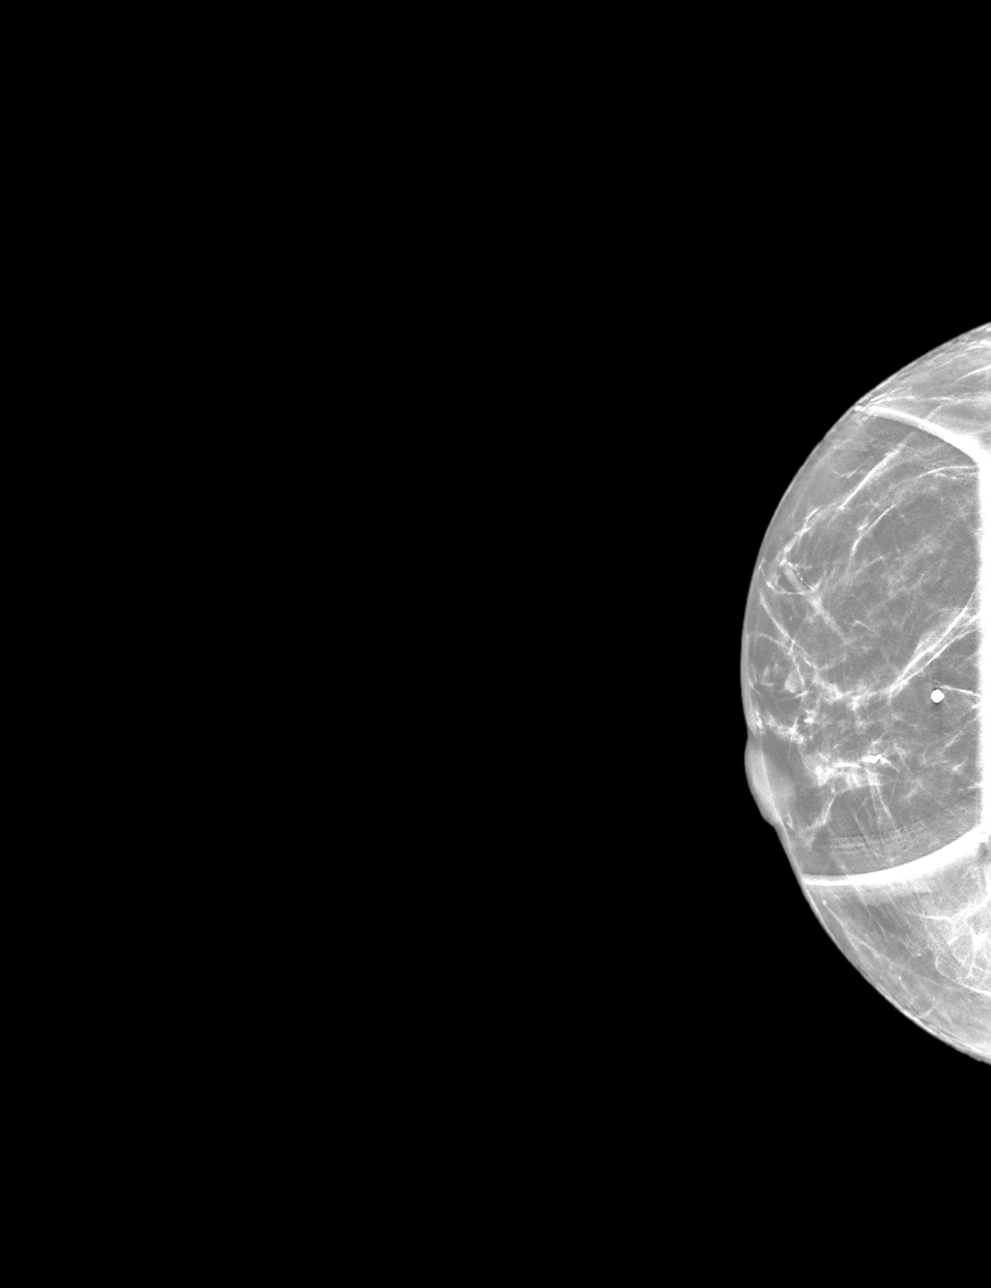

[R MLO tomo · tomo slice 19/38.0]
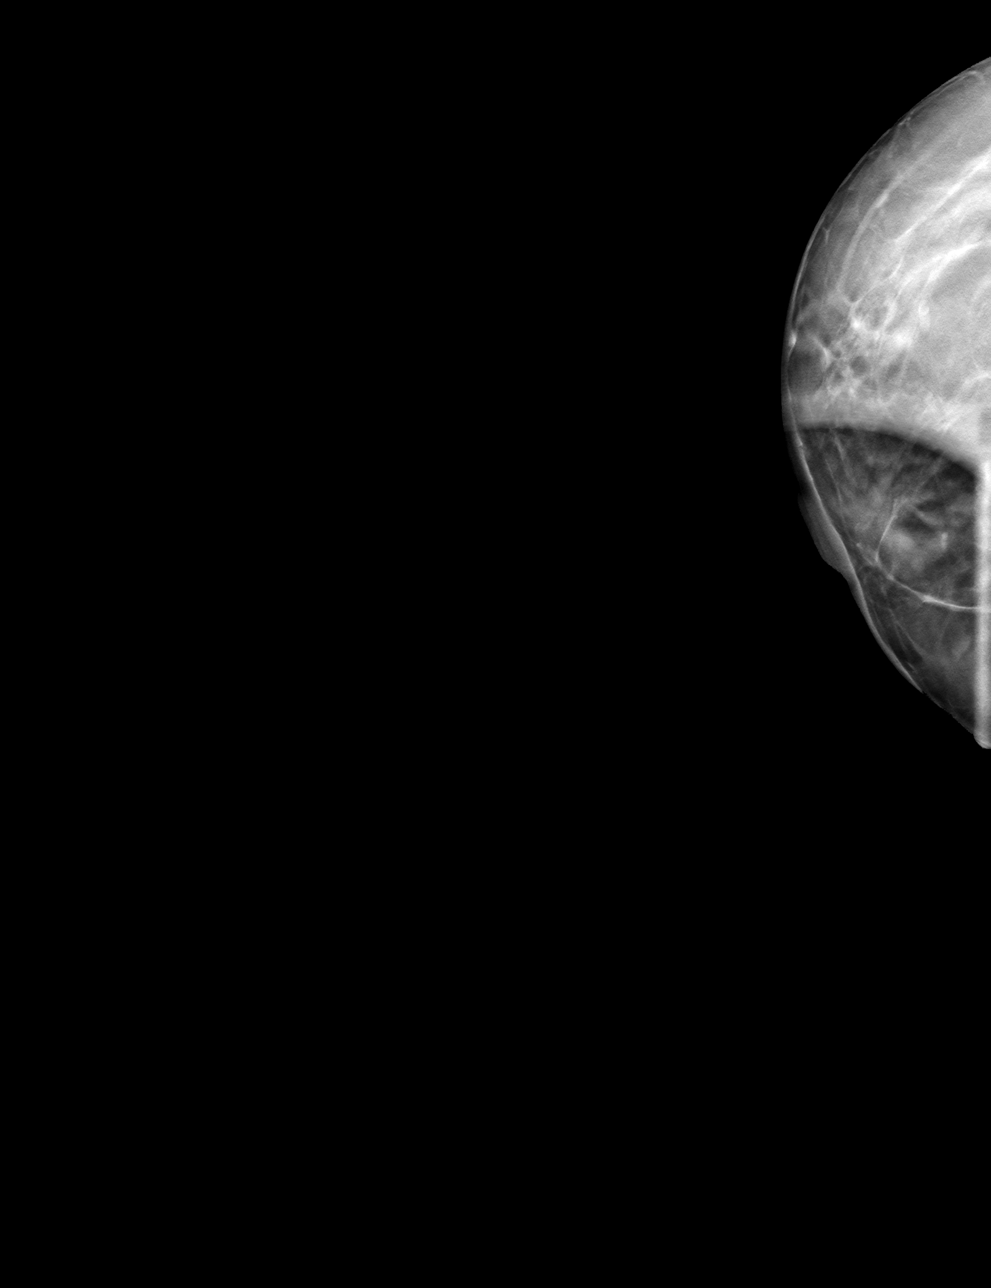

[R CC tomo · tomo slice 23/46.0]
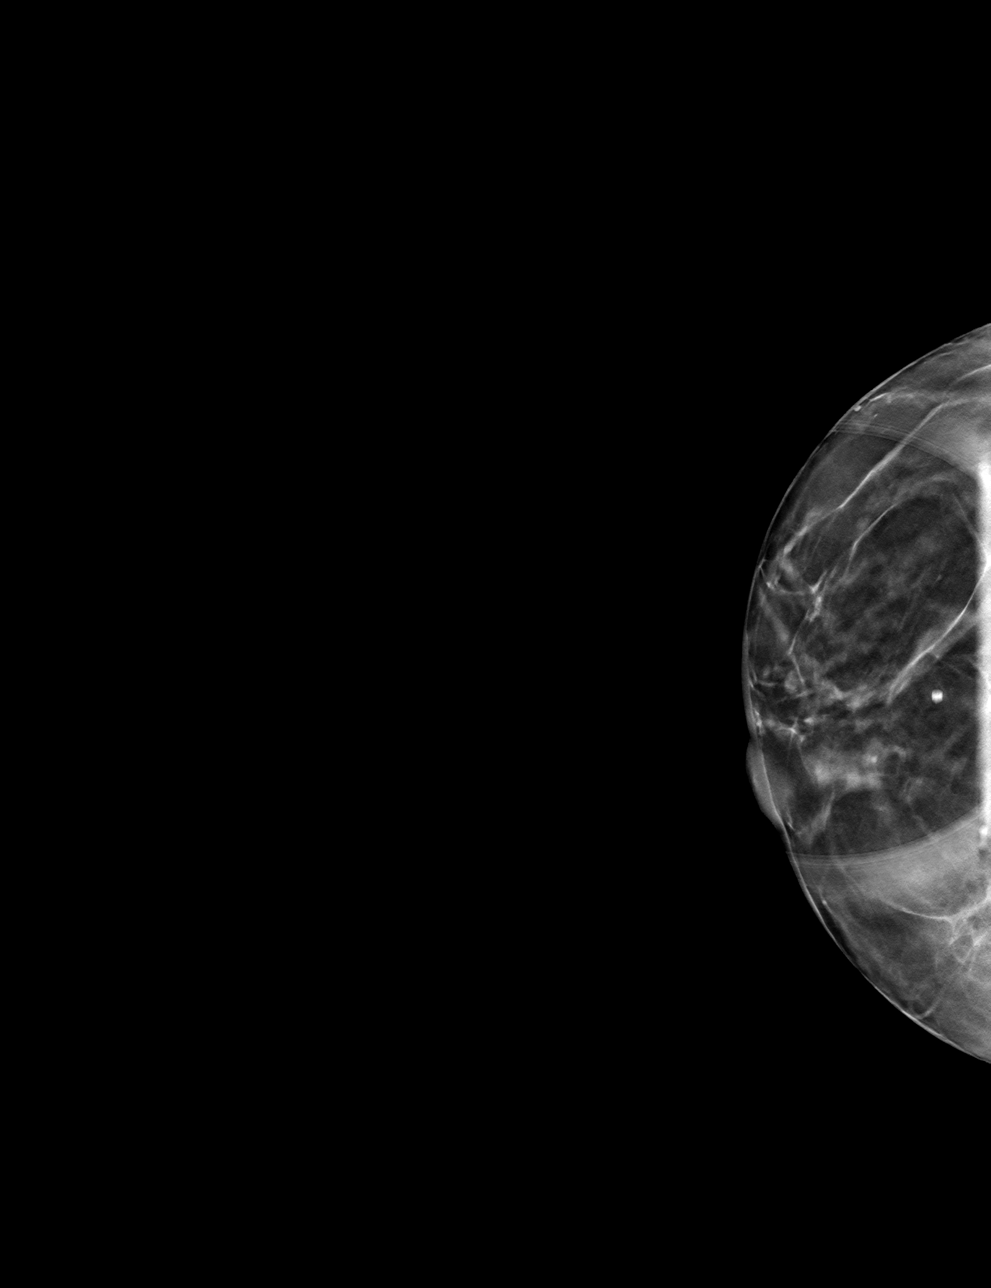

[4 of 12 positions shown; findings below may reference images not displayed]

ACR Breast Density Category c: The breast tissue is heterogeneously
dense, which may obscure small masses.
FINDINGS: On today's additional diagnostic views, there is a partially
obscured low-density mass confirmed within the outer quadrant RIGHT
breast, measuring approximately 5 mm greatest dimension.

Mammographic images were processed with CAD.

Targeted ultrasound is performed, evaluating the entire outer, lower
and retroareolar RIGHT breast, showing an oval circumscribed
hypoechoic mass at the 8-9 o'clock axis, retroareolar, measuring 5 x
3 x 4 mm, without internal vascularity, corresponding to the
mammographic finding. Images are labeled 9 o'clock axis, however,
mass is closer to the 8 o'clock axis depending on patient
positioning.

There is additional mild duct ectasia within the retroareolar to 6
o'clock axis of the RIGHT breast. No additional solid or cystic
mass.
IMPRESSION: 1. Probably benign complicated cyst within the retroareolar RIGHT
breast, 8-9 o'clock axis, measuring 5 mm, corresponding to the
mammographic finding. Recommend follow-up RIGHT breast diagnostic
mammogram and ultrasound in 6 months to ensure stability.
2. Mild benign duct ectasia within the retroareolar to 6 o'clock of
axis of the RIGHT breast.

RECOMMENDATION:
RIGHT breast diagnostic mammogram and ultrasound in 6 months.

I have discussed the findings and recommendations with the patient.
If applicable, a reminder letter will be sent to the patient
regarding the next appointment.

BI-RADS CATEGORY  3: Probably benign.

## 2021-11-24 ENCOUNTER — Other Ambulatory Visit: Payer: Self-pay

## 2021-11-24 ENCOUNTER — Ambulatory Visit: Payer: Federal, State, Local not specified - PPO | Admitting: Adult Health

## 2021-11-24 ENCOUNTER — Encounter: Payer: Self-pay | Admitting: Adult Health

## 2021-11-24 VITALS — BP 140/74 | HR 72 | Temp 96.1°F | Ht 62.99 in | Wt 132.2 lb

## 2021-11-24 DIAGNOSIS — E039 Hypothyroidism, unspecified: Secondary | ICD-10-CM

## 2021-11-24 DIAGNOSIS — E559 Vitamin D deficiency, unspecified: Secondary | ICD-10-CM | POA: Diagnosis not present

## 2021-11-24 DIAGNOSIS — F419 Anxiety disorder, unspecified: Secondary | ICD-10-CM | POA: Diagnosis not present

## 2021-11-24 DIAGNOSIS — Z8639 Personal history of other endocrine, nutritional and metabolic disease: Secondary | ICD-10-CM | POA: Insufficient documentation

## 2021-11-24 MED ORDER — SERTRALINE HCL 50 MG PO TABS: ORAL_TABLET | ORAL | 1 refills | Status: DC

## 2021-11-24 NOTE — Progress Notes (Signed)
New Patient Office Visit  Subjective:  Patient ID: Nicole Henry, female    DOB: [birth year]  Age: 60 y.o. MRN: [id number]  CC:  Chief Complaint  Patient presents with   New Patient (Initial Visit)    HPI Nicole Henry presents for establishment of care.  She and her husband had labs done at a clinic and was told that she had high cholesterol.   Endocrinolgy- Dr. Renne Crigler.  Urology- Dr. Erlene Quan suggested estrogen, patient did not do well with it. She is going to wait and talk to her gynecologist. Vaginal atrophy noted ny Dr. Erlene Quan. She reports frequency of urine better with watching sugar.   On zoloft several years. No suicidal or homicidal ideations or intents.  History of taking simvastatin in the past, 2014 and worked well.   Patient  denies any fever, body aches,chills, rash, chest pain, shortness of breath, nausea, vomiting, or diarrhea.   07/05/2022 colonoscopy due, seen Dr. Olevia Perches, patient will let me know who she prefers. She would like to think on it. She denies any abnormal bowel symptoms or pain in abdomen.   Denies dizziness, lightheadedness, pre syncopal or syncopal episodes.    Past Medical History:  Diagnosis Date   Allergic rhinitis due to pollen    Basal cell carcinoma    history of   Family history of diabetes mellitus    GERD (gastroesophageal reflux disease)    Hypothyroidism    Pure hypercholesterolemia    Rosacea    Screening for lipoid disorders     Past Surgical History:  Procedure Laterality Date   AUGMENTATION MAMMAPLASTY Bilateral    BREAST ENHANCEMENT SURGERY  1997    Family History  Problem Relation Age of Onset   Hypertension Mother    Hyperlipidemia Mother    Hyperlipidemia Father    Hypertension Father    Prostate cancer Father    Heart disease Brother        ? 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   Diet- fruits and veggies.   Social Determinants of Health   Financial Resource Strain: Not on file  Food Insecurity: Not on file  Transportation Needs: Not on file  Physical Activity: Not on file  Stress: Not on file  Social Connections: Not on file  Intimate Partner Violence: Not on file    ROS Review of Systems  Constitutional: Negative.   HENT: Negative.    Respiratory: Negative.    Cardiovascular: Negative.   Gastrointestinal: Negative.   Genitourinary: Negative.   Musculoskeletal: Negative.   Skin: Negative.        Followed by dermatology denies any changes since seeing.   Neurological: Negative.  Hematological: Negative.   Psychiatric/Behavioral:  Negative for agitation, behavioral problems, confusion, decreased concentration, dysphoric mood, hallucinations, self-injury, sleep disturbance and suicidal ideas. The patient is nervous/anxious. The patient is not hyperactive.    Objective:   Today's Vitals: BP 140/74   Pulse 72   Temp (!) 96.1 F (35.6 C)   Ht 5' 2.99" (1.6 m)   Wt 132 lb 3.2 oz (60 kg)   SpO2 97%   BMI 23.42 kg/m   Physical Exam Vitals reviewed.  Constitutional:      General: She is not in acute distress.    Appearance: She is well-developed. She is not diaphoretic.     Interventions: She is not intubated. HENT:     Head: Normocephalic and atraumatic.     Right Ear: External ear normal.     Left Ear: External ear normal.     Nose: Nose normal.      Mouth/Throat:     Pharynx: No oropharyngeal exudate.  Eyes:     General: Lids are normal. No scleral icterus.       Right eye: No discharge.        Left eye: No discharge.     Conjunctiva/sclera: Conjunctivae normal.     Right eye: Right conjunctiva is not injected. No exudate or hemorrhage.    Left eye: Left conjunctiva is not injected. No exudate or hemorrhage.    Pupils: Pupils are equal, round, and reactive to light.  Neck:     Thyroid: No thyroid mass or thyromegaly.     Vascular: Normal carotid pulses. No carotid bruit, hepatojugular reflux or JVD.     Trachea: Trachea and phonation normal. No tracheal tenderness or tracheal deviation.     Meningeal: Brudzinski's sign and Kernig's sign absent.  Cardiovascular:     Rate and Rhythm: Normal rate and regular rhythm.     Pulses: Normal pulses.          Radial pulses are 2+ on the right side and 2+ on the left side.       Dorsalis pedis pulses are 2+ on the right side and 2+ on the left side.       Posterior tibial pulses are 2+ on the right side and 2+ on the left side.     Heart sounds: Normal heart sounds, S1 normal and S2 normal. Heart sounds not distant. No murmur heard.   No friction rub. No gallop.  Pulmonary:     Effort: Pulmonary effort is normal. No tachypnea, bradypnea, accessory muscle usage or respiratory distress. She is not intubated.     Breath sounds: Normal breath sounds. No stridor. No wheezing, rhonchi or rales.  Chest:     Chest wall: No tenderness.  Abdominal:     General: Bowel sounds are normal. There is no distension or abdominal bruit.     Palpations: Abdomen is soft. There is no shifting dullness, fluid wave, hepatomegaly, splenomegaly, mass or pulsatile mass.     Tenderness: There is no abdominal tenderness. There is no guarding or rebound.     Hernia: No hernia is present.  Musculoskeletal:        General: No tenderness or deformity. Normal range of motion.     Cervical back: Full passive range of  motion without pain, normal range of motion and neck supple. No edema, erythema or rigidity. No spinous process tenderness or muscular tenderness. Normal range of motion.  Lymphadenopathy:     Head:     Right side of head:  No submental, submandibular, tonsillar, preauricular, posterior auricular or occipital adenopathy.     Left side of head: No submental, submandibular, tonsillar, preauricular, posterior auricular or occipital adenopathy.     Cervical: No cervical adenopathy.     Right cervical: No superficial, deep or posterior cervical adenopathy.    Left cervical: No superficial, deep or posterior cervical adenopathy.     Upper Body:     Right upper body: No supraclavicular or pectoral adenopathy.     Left upper body: No supraclavicular or pectoral adenopathy.  Skin:    General: Skin is warm and dry.     Coloration: Skin is not pale.     Findings: No abrasion, bruising, burn, ecchymosis, erythema, lesion, petechiae or rash.     Nails: There is no clubbing.  Neurological:     Mental Status: She is alert and oriented to person, place, and time.     GCS: GCS eye subscore is 4. GCS verbal subscore is 5. GCS motor subscore is 6.     Cranial Nerves: No cranial nerve deficit.     Sensory: No sensory deficit.     Motor: No weakness, tremor, atrophy, abnormal muscle tone or seizure activity.     Coordination: Coordination normal.     Gait: Gait normal.     Deep Tendon Reflexes: Reflexes are normal and symmetric. Reflexes normal. Babinski sign absent on the right side. Babinski sign absent on the left side.     Reflex Scores:      Tricep reflexes are 2+ on the right side and 2+ on the left side.      Bicep reflexes are 2+ on the right side and 2+ on the left side.      Brachioradialis reflexes are 2+ on the right side and 2+ on the left side.      Patellar reflexes are 2+ on the right side and 2+ on the left side.      Achilles reflexes are 2+ on the right side and 2+ on the left  side. Psychiatric:        Mood and Affect: Mood normal.        Speech: Speech normal.        Behavior: Behavior normal.        Thought Content: Thought content normal.        Judgment: Judgment normal.    Assessment & Plan:   Problem List Items Addressed This Visit       Endocrine   Hypothyroidism - Primary   Relevant Orders   TSH     Other   Adjustment disorder with anxiety   Relevant Medications   sertraline (ZOLOFT) 50 MG tablet   History of hyperlipidemia   Relevant Orders   Lipid panel   Vitamin D deficiency   Relevant Orders   VITAMIN D 25 Hydroxy (Vit-D Deficiency, Fractures)    Outpatient Encounter Medications as of 11/24/2021  Medication Sig   SYNTHROID 50 MCG tablet Take 2 tablets by mouth every day before breakfast   [DISCONTINUED] sertraline (ZOLOFT) 50 MG tablet TAKE 1 TABLET BY MOUTH EVERY DAY. Establish further refills   Estradiol Starter Pack (IMVEXXY STARTER PACK) 4 MCG INST  (Patient not taking: Reported on 11/24/2021)   mirabegron ER (MYRBETRIQ) 25 MG TB24 tablet Take 1 tablet (25 mg total) by mouth daily. (Patient not taking: Reported on 11/24/2021)   sertraline (ZOLOFT) 50 MG tablet TAKE 1 TABLET BY MOUTH EVERY DAY. Establish further refills   [DISCONTINUED] cefUROXime (CEFTIN)  500 MG tablet Take 1 tablet (500 mg total) by mouth 2 (two) times daily with a meal. (Patient not taking: Reported on 11/24/2021)   No facility-administered encounter medications on file as of 11/24/2021.   Orders Placed This Encounter  Procedures   CBC with Differential/Platelet   Comprehensive metabolic panel   TSH   Lipid panel   VITAMIN D 25 Hydroxy (Vit-D Deficiency, Fractures)   Schedule labs fasting and also CPE when due.   Keep follow up with specialty providers  Red Flags discussed. The patient was given clear instructions to go to ER or return to medical center if any red flags develop, symptoms do not improve, worsen or new problems develop. They verbalized  understanding.  Follow-up: Return if symptoms worsen or fail to improve, for at any time for any worsening symptoms, Go to Emergency room/ urgent care if worse.   Marcille Buffy, FNP

## 2021-11-24 NOTE — Patient Instructions (Addendum)
Sertraline Tablets What is this medication? SERTRALINE (SER tra leen) treats depression, anxiety, obsessive-compulsive disorder (OCD), post-traumatic stress disorder (PTSD), and premenstrual dysphoric disorder (PMDD). It increases the amount of serotonin in the brain, a hormone that helps regulate mood. It belongs to a group of medications called SSRIs. This medicine may be used for other purposes; ask your health care provider or pharmacist if you have questions. COMMON BRAND NAME(S): Zoloft What should I tell my care team before I take this medication? They need to know if you have any of these conditions: Bleeding disorders Bipolar disorder or a family history of bipolar disorder Frequently drink alcohol Glaucoma Heart disease High blood pressure History of irregular heartbeat History of low levels of calcium, magnesium, or potassium in the blood Liver disease Receiving electroconvulsive therapy Seizures Suicidal thoughts, plans, or attempt; a previous suicide attempt by you or a family member Take medications that prevent or treat blood clots Thyroid disease An unusual or allergic reaction to sertraline, other medications, foods, dyes, or preservatives Pregnant or trying to get pregnant Breast-feeding How should I use this medication? Take this medication by mouth with a glass of water. Follow the directions on the prescription label. You can take it with or without food. Take your medication at regular intervals. Do not take your medication more often than directed. Do not stop taking this medication suddenly except upon the advice of your care team. Stopping this medication too quickly may cause serious side effects or your condition may worsen. A special MedGuide will be given to you by the pharmacist with each prescription and refill. Be sure to read this information carefully each time. Talk to your care team about the use of this medication in children. While this medication may  be prescribed for children as young as 7 years for selected conditions, precautions do apply. Overdosage: If you think you have taken too much of this medicine contact a poison control center or emergency room at once. NOTE: This medicine is only for you. Do not share this medicine with others. What if I miss a dose? If you miss a dose, take it as soon as you can. If it is almost time for your next dose, take only that dose. Do not take double or extra doses. What may interact with this medication? Do not take this medication with any of the following: Cisapride Dronedarone Linezolid MAOIs like Carbex, Eldepryl, Marplan, Nardil, and Parnate Methylene blue (injected into a vein) Pimozide Thioridazine This medication may also interact with the following: Alcohol Amphetamines Aspirin and aspirin-like medications Certain medications for depression, anxiety, or other mental health conditions Certain medications for fungal infections like ketoconazole, fluconazole, posaconazole, and itraconazole Certain medications for irregular heart beat like flecainide, quinidine, propafenone Certain medications for migraine headaches like almotriptan, eletriptan, frovatriptan, naratriptan, rizatriptan, sumatriptan, zolmitriptan Certain medications for sleep Certain medications for seizures like carbamazepine, valproic acid, phenytoin Certain medications that treat or prevent blood clots like warfarin, enoxaparin, dalteparin Cimetidine Digoxin Diuretics Fentanyl Isoniazid Lithium NSAIDs, medications for pain and inflammation, like ibuprofen or naproxen Other medications that prolong the QT interval (cause an abnormal heart rhythm) like dofetilide Rasagiline Safinamide Supplements like St. John's wort, kava kava, valerian Tolbutamide Tramadol Tryptophan This list may not describe all possible interactions. Give your health care provider a list of all the medicines, herbs, non-prescription drugs, or  dietary supplements you use. Also tell them if you smoke, drink alcohol, or use illegal drugs. Some items may interact with your medicine. What should  I watch for while using this medication? Tell your care team if your symptoms do not get better or if they get worse. Visit your care team for regular checks on your progress. Because it may take several weeks to see the full effects of this medication, it is important to continue your treatment as prescribed by your care team. Patients and their families should watch out for new or worsening thoughts of suicide or depression. Also watch out for sudden changes in feelings such as feeling anxious, agitated, panicky, irritable, hostile, aggressive, impulsive, severely restless, overly excited and hyperactive, or not being able to sleep. If this happens, especially at the beginning of treatment or after a change in dose, call your care team. This medication may affect your coordination, reaction time, or judgment. Do not drive or operate machinery until you know how this medication affects you. Sit or stand up slowly to reduce the risk of dizzy or fainting spells. Drinking alcohol with this medication can increase the risk of these side effects. Your mouth may get dry. Chewing sugarless gum or sucking hard candy, and drinking plenty of water may help. Contact your care team if the problem does not go away or is severe. What side effects may I notice from receiving this medication? Side effects that you should report to your care team as soon as possible: Allergic reactions--skin rash, itching, hives, swelling of the face, lips, tongue, or throat Bleeding--bloody or black, tar-like stools, red or dark brown urine, vomiting blood or brown material that looks like coffee grounds, small red or purple spots on skin, unusual bleeding or bruising Heart rhythm changes--fast or irregular heartbeat, dizziness, feeling faint or lightheaded, chest pain, trouble  breathing Low sodium level--muscle weakness, fatigue, dizziness, headache, confusion Serotonin syndrome--irritability, confusion, fast or irregular heartbeat, muscle stiffness, twitching muscles, sweating, high fever, seizure, chills, vomiting, diarrhea Sudden eye pain or change in vision such as blurred vision, seeing halos around lights, vision loss Thoughts of suicide or self-harm, worsening mood Side effects that usually do not require medical attention (report these to your care team if they continue or are bothersome): Change in sex drive or performance Diarrhea Excessive sweating Nausea Tremors or shaking Upset stomach This list may not describe all possible side effects. Call your doctor for medical advice about side effects. You may report side effects to FDA at 1-800-FDA-1088. Where should I keep my medication? Keep out of the reach of children and pets. Store at room temperature between 15 and 30 degrees C (59 and 86 degrees F). Get rid of any unused medication after the expiration date. To get rid of medications that are no longer needed or expired: Take the medication to a medication take-back program. Check with your pharmacy or law enforcement to find a location. If you cannot return the medication, check the label or package insert to see if the medication should be thrown out in the garbage or flushed down the toilet. If you are not sure, ask your care team. If it is safe to put in the trash, empty the medication out of the container. Mix the medication with cat litter, dirt, coffee grounds, or other unwanted substance. Seal the mixture in a bag or container. Put it in the trash. NOTE: This sheet is a summary. It may not cover all possible information. If you have questions about this medicine, talk to your doctor, pharmacist, or health care provider.  2022 Elsevier/Gold Standard (2021-06-02 00:00:00) Health Maintenance, Female Adopting a healthy lifestyle and  getting  preventive care are important in promoting health and wellness. Ask your health care provider about: The right schedule for you to have regular tests and exams. Things you can do on your own to prevent diseases and keep yourself healthy. What should I know about diet, weight, and exercise? Eat a healthy diet  Eat a diet that includes plenty of vegetables, fruits, low-fat dairy products, and lean protein. Do not eat a lot of foods that are high in solid fats, added sugars, or sodium. Maintain a healthy weight Body mass index (BMI) is used to identify weight problems. It estimates body fat based on height and weight. Your health care provider can help determine your BMI and help you achieve or maintain a healthy weight. Get regular exercise Get regular exercise. This is one of the most important things you can do for your health. Most adults should: Exercise for at least 150 minutes each week. The exercise should increase your heart rate and make you sweat (moderate-intensity exercise). Do strengthening exercises at least twice a week. This is in addition to the moderate-intensity exercise. Spend less time sitting. Even light physical activity can be beneficial. Watch cholesterol and blood lipids Have your blood tested for lipids and cholesterol at 60 years of age, then have this test every 5 years. Have your cholesterol levels checked more often if: Your lipid or cholesterol levels are high. You are older than 60 years of age. You are at high risk for heart disease. What should I know about cancer screening? Depending on your health history and family history, you may need to have cancer screening at various ages. This may include screening for: Breast cancer. Cervical cancer. Colorectal cancer. Skin cancer. Lung cancer. What should I know about heart disease, diabetes, and high blood pressure? Blood pressure and heart disease High blood pressure causes heart disease and increases the  risk of stroke. This is more likely to develop in people who have high blood pressure readings or are overweight. Have your blood pressure checked: Every 3-5 years if you are 28-65 years of age. Every year if you are 58 years old or older. Diabetes Have regular diabetes screenings. This checks your fasting blood sugar level. Have the screening done: Once every three years after age 77 if you are at a normal weight and have a low risk for diabetes. More often and at a younger age if you are overweight or have a high risk for diabetes. What should I know about preventing infection? Hepatitis B If you have a higher risk for hepatitis B, you should be screened for this virus. Talk with your health care provider to find out if you are at risk for hepatitis B infection. Hepatitis C Testing is recommended for: Everyone born from [birth year] through 1965. Anyone with known risk factors for hepatitis C. Sexually transmitted infections (STIs) Get screened for STIs, including gonorrhea and chlamydia, if: You are sexually active and are younger than 60 years of age. You are older than 59 years of age and your health care provider tells you that you are at risk for this type of infection. Your sexual activity has changed since you were last screened, and you are at increased risk for chlamydia or gonorrhea. Ask your health care provider if you are at risk. Ask your health care provider about whether you are at high risk for HIV. Your health care provider may recommend a prescription medicine to help prevent HIV infection. If you choose to take medicine  to prevent HIV, you should first get tested for HIV. You should then be tested every 3 months for as long as you are taking the medicine. Pregnancy If you are about to stop having your period (premenopausal) and you may become pregnant, seek counseling before you get pregnant. Take 400 to 800 micrograms (mcg) of folic acid every day if you become pregnant. Ask for  birth control (contraception) if you want to prevent pregnancy. Osteoporosis and menopause Osteoporosis is a disease in which the bones lose minerals and strength with aging. This can result in bone fractures. If you are 16 years old or older, or if you are at risk for osteoporosis and fractures, ask your health care provider if you should: Be screened for bone loss. Take a calcium or vitamin D supplement to lower your risk of fractures. Be given hormone replacement therapy (HRT) to treat symptoms of menopause. Follow these instructions at home: Alcohol use Do not drink alcohol if: Your health care provider tells you not to drink. You are pregnant, may be pregnant, or are planning to become pregnant. If you drink alcohol: Limit how much you have to: 0-1 drink a day. Know how much alcohol is in your drink. In the [place] one drink equals one 12 oz bottle of beer (355 mL), one 5 oz glass of wine (148 mL), or one 1 oz glass of hard liquor (44 mL). Lifestyle Do not use any products that contain nicotine or tobacco. These products include cigarettes, chewing tobacco, and vaping devices, such as e-cigarettes. If you need help quitting, ask your health care provider. Do not use street drugs. Do not share needles. Ask your health care provider for help if you need support or information about quitting drugs. General instructions Schedule regular health, dental, and eye exams. Stay current with your vaccines. Tell your health care provider if: You often feel depressed. You have ever been abused or do not feel safe at home. Summary Adopting a healthy lifestyle and getting preventive care are important in promoting health and wellness. Follow your health care provider's instructions about healthy diet, exercising, and getting tested or screened for diseases. Follow your health care provider's instructions on monitoring your cholesterol and blood pressure. This information is not intended to replace  advice given to you by your health care provider. Make sure you discuss any questions you have with your health care provider. Document Revised: 04/21/2021 Document Reviewed: 04/21/2021 Elsevier Patient Education  [place].

## 2021-11-28 ENCOUNTER — Other Ambulatory Visit: Payer: Federal, State, Local not specified - PPO

## 2021-12-31 ENCOUNTER — Other Ambulatory Visit: Payer: Federal, State, Local not specified - PPO

## 2022-01-05 ENCOUNTER — Encounter: Payer: Self-pay | Admitting: Adult Health

## 2022-01-05 DIAGNOSIS — E785 Hyperlipidemia, unspecified: Secondary | ICD-10-CM

## 2022-01-06 ENCOUNTER — Other Ambulatory Visit: Payer: Self-pay

## 2022-01-06 DIAGNOSIS — Z833 Family history of diabetes mellitus: Secondary | ICD-10-CM

## 2022-01-07 ENCOUNTER — Other Ambulatory Visit: Payer: Self-pay

## 2022-01-07 ENCOUNTER — Other Ambulatory Visit (INDEPENDENT_AMBULATORY_CARE_PROVIDER_SITE_OTHER): Payer: Federal, State, Local not specified - PPO

## 2022-01-07 DIAGNOSIS — Z8639 Personal history of other endocrine, nutritional and metabolic disease: Secondary | ICD-10-CM

## 2022-01-07 DIAGNOSIS — E559 Vitamin D deficiency, unspecified: Secondary | ICD-10-CM

## 2022-01-07 DIAGNOSIS — E039 Hypothyroidism, unspecified: Secondary | ICD-10-CM | POA: Diagnosis not present

## 2022-01-07 DIAGNOSIS — Z833 Family history of diabetes mellitus: Secondary | ICD-10-CM

## 2022-01-07 DIAGNOSIS — F419 Anxiety disorder, unspecified: Secondary | ICD-10-CM | POA: Diagnosis not present

## 2022-01-07 LAB — TSH: TSH: 0.62 u[IU]/mL (ref 0.35–5.50)

## 2022-01-07 LAB — COMPREHENSIVE METABOLIC PANEL
ALT: 9 U/L (ref 0–35)
AST: 15 U/L (ref 0–37)
Albumin: 4.3 g/dL (ref 3.5–5.2)
Alkaline Phosphatase: 87 U/L (ref 39–117)
BUN: 12 mg/dL (ref 6–23)
CO2: 31 mEq/L (ref 19–32)
Calcium: 9.5 mg/dL (ref 8.4–10.5)
Chloride: 101 mEq/L (ref 96–112)
Creatinine, Ser: 0.82 mg/dL (ref 0.40–1.20)
GFR: 77.88 mL/min (ref >60.00)
Glucose, Bld: 93 mg/dL (ref 70–99)
Potassium: 4.3 mEq/L (ref 3.5–5.1)
Sodium: 139 mEq/L (ref 135–145)
Total Bilirubin: 0.4 mg/dL (ref 0.2–1.2)
Total Protein: 7.3 g/dL (ref 6.0–8.3)

## 2022-01-07 LAB — CBC WITH DIFFERENTIAL/PLATELET
Basophils Absolute: 0 10*3/uL (ref 0.0–0.1)
Basophils Relative: 0.8 % (ref 0.0–3.0)
Eosinophils Absolute: 0.1 10*3/uL (ref 0.0–0.7)
Eosinophils Relative: 3.1 % (ref 0.0–5.0)
HCT: 38.7 % (ref 36.0–46.0)
Hemoglobin: 12.5 g/dL (ref 12.0–15.0)
Lymphocytes Relative: 40.4 % (ref 12.0–46.0)
Lymphs Abs: 1.8 10*3/uL (ref 0.7–4.0)
MCHC: 32.4 g/dL (ref 30.0–36.0)
MCV: 85.8 fl (ref 78.0–100.0)
Monocytes Absolute: 0.5 10*3/uL (ref 0.1–1.0)
Monocytes Relative: 10.9 % (ref 3.0–12.0)
Neutro Abs: 2 10*3/uL (ref 1.4–7.7)
Neutrophils Relative %: 44.8 % (ref 43.0–77.0)
Platelets: 336 10*3/uL (ref 150.0–400.0)
RBC: 4.51 Mil/uL (ref 3.87–5.11)
RDW: 14 % (ref 11.5–15.5)
WBC: 4.4 10*3/uL (ref 4.0–10.5)

## 2022-01-07 LAB — LIPID PANEL
Cholesterol: 262 mg/dL — ABNORMAL HIGH (ref 0–200)
HDL: 59.1 mg/dL (ref >39.00)
LDL Cholesterol: 178 mg/dL — ABNORMAL HIGH (ref 0–99)
NonHDL: 202.65
Total CHOL/HDL Ratio: 4
Triglycerides: 123 mg/dL (ref 0.0–149.0)
VLDL: 24.6 mg/dL (ref 0.0–40.0)

## 2022-01-07 LAB — HEMOGLOBIN A1C: Hgb A1c MFr Bld: 5.9 % (ref 4.6–6.5)

## 2022-01-07 LAB — VITAMIN D 25 HYDROXY (VIT D DEFICIENCY, FRACTURES): VITD: 30.84 ng/mL (ref 30.00–100.00)

## 2022-01-07 NOTE — Progress Notes (Signed)
Total cholesterol and LDL elevated.  Discuss lifestyle modification with patient e.g. increase exercise, fiber, fruits, vegetables, lean meat, and omega 3/fish intake and decrease saturated fat.  I see she was unable to tolerate simvastatin in past ? Interested in taking any cholesterol medication or lipid clinic referral ?   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   Vitamin D is 30.84, can add vitamin D 3 at 2,000 IU(international) units once daily by mouth. Recheck vitamin D in 4 months. Please add recheck vitamin D lab and have patient schedule.   TSH is 0.62 normal and thyroid is followed by [facility] and has been reviewed by her, keep follow up with endocrinology.  CBC and CMP is within normal limits.

## 2022-01-13 DIAGNOSIS — E785 Hyperlipidemia, unspecified: Secondary | ICD-10-CM | POA: Insufficient documentation

## 2022-01-13 MED ORDER — ROSUVASTATIN CALCIUM 5 MG PO TABS: 5.0000 mg | ORAL_TABLET | Freq: Every day | ORAL | 1 refills | Status: DC

## 2022-02-13 ENCOUNTER — Other Ambulatory Visit: Payer: Self-pay

## 2022-02-13 ENCOUNTER — Other Ambulatory Visit (INDEPENDENT_AMBULATORY_CARE_PROVIDER_SITE_OTHER): Payer: Federal, State, Local not specified - PPO

## 2022-02-13 DIAGNOSIS — E785 Hyperlipidemia, unspecified: Secondary | ICD-10-CM

## 2022-02-13 LAB — COMPREHENSIVE METABOLIC PANEL
ALT: 12 U/L (ref 0–35)
AST: 17 U/L (ref 0–37)
Albumin: 4.4 g/dL (ref 3.5–5.2)
Alkaline Phosphatase: 91 U/L (ref 39–117)
BUN: 11 mg/dL (ref 6–23)
CO2: 32 mEq/L (ref 19–32)
Calcium: 9.6 mg/dL (ref 8.4–10.5)
Chloride: 102 mEq/L (ref 96–112)
Creatinine, Ser: 0.73 mg/dL (ref 0.40–1.20)
GFR: 89.47 mL/min (ref >60.00)
Glucose, Bld: 98 mg/dL (ref 70–99)
Potassium: 4.2 mEq/L (ref 3.5–5.1)
Sodium: 140 mEq/L (ref 135–145)
Total Bilirubin: 0.5 mg/dL (ref 0.2–1.2)
Total Protein: 7.5 g/dL (ref 6.0–8.3)

## 2022-02-16 NOTE — Progress Notes (Signed)
CMP within normal limits.

## 2022-02-17 ENCOUNTER — Encounter: Payer: Self-pay | Admitting: Adult Health

## 2022-02-17 ENCOUNTER — Ambulatory Visit: Payer: Federal, State, Local not specified - PPO | Admitting: Adult Health

## 2022-02-17 ENCOUNTER — Other Ambulatory Visit: Payer: Self-pay

## 2022-02-17 VITALS — BP 128/70 | HR 78 | Temp 98.0°F | Wt 132.0 lb

## 2022-02-17 DIAGNOSIS — E559 Vitamin D deficiency, unspecified: Secondary | ICD-10-CM | POA: Diagnosis not present

## 2022-02-17 DIAGNOSIS — Z833 Family history of diabetes mellitus: Secondary | ICD-10-CM | POA: Diagnosis not present

## 2022-02-17 DIAGNOSIS — J0140 Acute pansinusitis, unspecified: Secondary | ICD-10-CM | POA: Diagnosis not present

## 2022-02-17 DIAGNOSIS — E785 Hyperlipidemia, unspecified: Secondary | ICD-10-CM

## 2022-02-17 MED ORDER — PREDNISONE 10 MG (21) PO TBPK: ORAL_TABLET | ORAL | 0 refills | Status: DC

## 2022-02-17 MED ORDER — AZITHROMYCIN 250 MG PO TABS: ORAL_TABLET | ORAL | 0 refills | Status: AC

## 2022-02-17 NOTE — Patient Instructions (Signed)

## 2022-02-17 NOTE — Progress Notes (Addendum)
Established Patient Office Visit  Subjective:  Patient ID: Nicole Henry, female    DOB: November 03, 1961  Age: 61 y.o. MRN: [id number]  CC:  Chief Complaint  Patient presents with   Follow-up    F/u to review labs. Pt also c/o sinus infection? Pt states sinus pressure and runny nose/ yellow discharge from nose that wont go away. Pt denies fever, cough, chest pain, sore throat, etc.     HPI [name] presents for sinus congestion and sinus pressure for the last 9 days she reports frontal sinus pressure mostly. Postnasal drip.  She is going on a flight in a couple days she is having ear pressure.  She reports is mostly nasal and facial pressure and congestion she denies any chest congestion or cough other than postnasal drip coughing.   Patient  denies any fever, body aches,chills, rash, chest pain, shortness of breath, nausea, vomiting, or diarrhea.  Denies dizziness, lightheadedness, pre syncopal or syncopal episodes. '  Allergies  Allergen Reactions   Ciprofloxacin    Nitrofurantoin Other (See Comments)    unknown   Simvastatin     myaglias   Penicillins Rash   Sulfonamide Derivatives Rash   Yellow Dyes (Non-Tartrazine) Rash     Past Medical History:  Diagnosis Date   Allergic rhinitis due to pollen    Basal cell carcinoma    history of   Family history of diabetes mellitus    GERD (gastroesophageal reflux disease)    Hypothyroidism    Pure hypercholesterolemia    Rosacea    Screening for lipoid disorders     Past Surgical History:  Procedure Laterality Date   AUGMENTATION MAMMAPLASTY Bilateral    BREAST ENHANCEMENT SURGERY  1997    Family History  Problem Relation Age of Onset   Hypertension Mother    Hyperlipidemia Mother    Hyperlipidemia Father    Hypertension Father    Prostate cancer Father    Heart disease Brother        ? 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   Diet- fruits and veggies.   Social Determinants of Health   Financial Resource Strain: Not on file  Food Insecurity: Not on file  Transportation Needs: Not on file  Physical Activity: Not on file  Stress: Not on file  Social Connections: Not on file  Intimate Partner Violence: Not on file    Outpatient Medications Prior to Visit  Medication Sig Dispense Refill   rosuvastatin (CRESTOR) 5 MG tablet Take 1 tablet (5 mg total) by mouth daily. 90 tablet 1   sertraline (ZOLOFT) 50 MG tablet TAKE 1 TABLET BY MOUTH EVERY DAY. Establish further refills 90  tablet 1   SYNTHROID 50 MCG tablet Take 2 tablets by mouth every day before breakfast 180 tablet 3   Estradiol Starter Pack (IMVEXXY STARTER PACK) 4 MCG INST  (Patient not taking: Reported on 11/24/2021)     mirabegron ER (MYRBETRIQ) 25 MG TB24 tablet Take 1 tablet (25 mg total) by mouth daily. (Patient not taking: Reported on 11/24/2021) 28 tablet 0   No facility-administered medications prior to visit.    Allergies  Allergen Reactions   Ciprofloxacin    Nitrofurantoin Other (See Comments)    unknown   Simvastatin     myaglias   Penicillins Rash   Sulfonamide Derivatives Rash   Yellow Dyes (Non-Tartrazine) Rash    ROS Review of Systems  Constitutional: Negative.   HENT:  Positive for congestion, ear pain, postnasal drip, rhinorrhea, sinus pressure, sinus pain and sneezing. Negative for  dental problem, drooling, ear discharge, facial swelling, hearing loss, mouth sores, nosebleeds, sore throat, tinnitus, trouble swallowing and voice change.   Eyes: Negative.   Respiratory: Negative.    Cardiovascular: Negative.   Gastrointestinal: Negative.   Endocrine: Negative.   Genitourinary: Negative.   Musculoskeletal: Negative.   Skin: Negative.   Neurological: Negative.   Psychiatric/Behavioral: Negative.       Objective:    Physical Exam Vitals reviewed.  Constitutional:      Appearance: She is well-developed.  HENT:     Head: Normocephalic and atraumatic.     Jaw: There is normal jaw occlusion.     Salivary Glands: Right salivary gland is not diffusely enlarged or tender. Left salivary gland is not diffusely enlarged.     Right Ear: Hearing, ear canal and external ear normal. No decreased hearing noted. No drainage, swelling or tenderness. A middle ear effusion is present. No foreign body. Tympanic membrane is not erythematous or bulging.     Left Ear: Hearing, ear canal and external ear normal. No decreased hearing noted. No drainage, swelling or tenderness. A middle ear effusion is present. No foreign body. Tympanic membrane is not erythematous or bulging.     Nose: Congestion and rhinorrhea present. Rhinorrhea is clear.     Right Sinus: Frontal sinus tenderness present. No maxillary sinus tenderness.     Left Sinus: Frontal sinus tenderness present. No maxillary sinus tenderness.     Mouth/Throat:     Mouth: Mucous membranes are moist.     Pharynx: Uvula midline. No oropharyngeal exudate or posterior oropharyngeal erythema.     Tonsils: No tonsillar abscesses.  Eyes:     General: Lids are normal. Vision grossly intact. No scleral icterus.    Extraocular Movements: Extraocular movements intact.     Conjunctiva/sclera: Conjunctivae normal.     Pupils: Pupils are equal, round, and reactive to light.  Cardiovascular:     Rate and Rhythm: Regular rhythm.     Pulses:  Normal pulses.     Heart sounds: Normal heart sounds.  Pulmonary:     Effort: Pulmonary effort is normal.     Breath sounds: Normal breath sounds. No wheezing, rhonchi or rales.  Lymphadenopathy:     Head:     Right side of head: No submental, submandibular, tonsillar, preauricular, posterior auricular or occipital adenopathy.     Left side of head: No submental, submandibular, tonsillar, preauricular, posterior auricular or occipital adenopathy.     Cervical: No cervical adenopathy.  Skin:    General: Skin is warm and dry.  Neurological:     Mental Status: She is alert.  Psychiatric:        Speech: Speech normal.        Behavior: Behavior normal.        Thought Content: Thought content normal.    BP 128/70 (BP Location: Left Arm, Patient Position: Sitting, Cuff Size: Small)   Pulse 78   Temp 98 F (36.7 C) (Oral)   Wt 132 lb (59.9 kg)   SpO2 98%   BMI 23.39 kg/m  Wt Readings from Last 3 Encounters:  02/17/22 132 lb (59.9 kg)  11/24/21 132 lb 3.2 oz (60 kg)  09/23/21 136 lb (61.7 kg)    Vitals with BMI 02/17/2022 11/24/2021 09/23/2021  Height - 5' 2.992" 5\' 3"   Weight 132 lbs 132 lbs 3 oz 136 lbs  BMI - 23.42 24.1  Systolic 128 140 132  Diastolic 70 74 82  Pulse 78 72 79      There are no preventive care reminders to display for this patient.  There are no preventive care reminders to display for this patient.  Lab Results  Component Value Date   TSH 0.62 01/07/2022   Lab Results  Component Value Date   WBC 4.4 01/07/2022   HGB 12.5 01/07/2022   HCT 38.7 01/07/2022   MCV 85.8 01/07/2022   PLT 336.0 01/07/2022   Lab Results  Component Value Date   NA 140 02/13/2022   K 4.2 02/13/2022   CO2 32 02/13/2022   GLUCOSE 98 02/13/2022   BUN 11 02/13/2022   CREATININE 0.73 02/13/2022   BILITOT 0.5 02/13/2022   ALKPHOS 91 02/13/2022   AST 17 02/13/2022   ALT 12 02/13/2022   PROT 7.5 02/13/2022   ALBUMIN 4.4 02/13/2022   CALCIUM 9.6 02/13/2022   ANIONGAP 8  02/20/2019   GFR 89.47 02/13/2022   Lab Results  Component Value Date   CHOL 262 (H) 01/07/2022   Lab Results  Component Value Date   HDL 59.10 01/07/2022   Lab Results  Component Value Date   LDLCALC 178 (H) 01/07/2022   Lab Results  Component Value Date   TRIG 123.0 01/07/2022   Lab Results  Component Value Date   CHOLHDL 4 01/07/2022   Lab Results  Component Value Date   HGBA1C 5.9 01/07/2022      Assessment & Plan:   Problem List Items Addressed This Visit       Respiratory   Acute non-recurrent pansinusitis   Relevant Medications   azithromycin (ZITHROMAX) 250 MG tablet   predniSONE (STERAPRED UNI-PAK 21 TAB) 10 MG (21) TBPK tablet   Other Relevant Orders   COVID-19, Flu A+B and RSV   Culture, Group A Strep     Other   Vitamin D deficiency   Relevant Orders   Vitamin D (25 hydroxy)   Hyperlipidemia - Primary   Relevant Orders   Lipid Profile   Family history of diabetes mellitus   Relevant Orders   HgB A1c    Discussed medication with patient, she prefers azithromycin over recommended doxycycline, given penicillin allergy. She is getting ready to fly in a couple days discussed preventative for ear pain.   Nasal saline. Do advise to start a second generation antihistamine one of choice xyzal, zyrtec, Claritin or Allegra.  Meds ordered this encounter  Medications   azithromycin (ZITHROMAX) 250 MG tablet    Sig: Take 2 tablets on day 1, then 1 tablet daily on days 2 through 5    Dispense:  6 tablet    Refill:  0  predniSONE (STERAPRED UNI-PAK 21 TAB) 10 MG (21) TBPK tablet    Sig: PO: Take 6 tablets on day 1:Take 5 tablets day 2:Take 4 tablets day 3: Take 3 tablets day 4:Take 2 tablets day five: Take 1 tablet day 6    Dispense:  21 tablet    Refill:  0  Red Flags discussed. The patient was given clear instructions to go to ER or return to medical center if any red flags develop, symptoms do not improve, worsen or new problems develop. They  verbalized understanding.  The patient is advised to begin progressive daily aerobic exercise program, follow a low fat, low cholesterol diet, attempt to lose weight, reduce exposure to stress, improve dietary compliance, use calcium 1 gram daily with Vit D, continue current medications, continue current healthy lifestyle patterns, and return for routine annual checkups.   Patient is aware that his primary care provider is leaving the office and last day will be March 05, 2022 and he will need to establish care with a new primary care provider, list is available upfront for patient to help him with establishing care or they can choose a provider of their choice.Patient verbalized understanding of all instructions given and denies any further questions at this time.   Follow-up: Return if symptoms worsen or fail to improve, for at any time for any worsening symptoms, Go to Emergency room/ urgent care if worse.    Jairo Ben, FNP

## 2022-02-18 ENCOUNTER — Encounter: Payer: Self-pay | Admitting: Adult Health

## 2022-02-18 DIAGNOSIS — J0140 Acute pansinusitis, unspecified: Secondary | ICD-10-CM | POA: Insufficient documentation

## 2022-02-18 DIAGNOSIS — Z833 Family history of diabetes mellitus: Secondary | ICD-10-CM | POA: Insufficient documentation

## 2022-02-19 ENCOUNTER — Telehealth: Payer: Self-pay

## 2022-02-19 LAB — COVID-19, FLU A+B AND RSV
Influenza A, NAA: NOT DETECTED
Influenza B, NAA: NOT DETECTED
RSV, NAA: NOT DETECTED
SARS-CoV-2, NAA: NOT DETECTED

## 2022-02-19 NOTE — Progress Notes (Signed)
Negative for covid, flu, rsv.  ?Return if symptoms worsen or fail to improve, for at any time for any worsening symptoms, Go to Emergency room/ urgent care if worse.

## 2022-02-19 NOTE — Telephone Encounter (Signed)
-----   Message from Doreen Beam, [place] sent at 02/19/2022 10:39 AM EST ----- ?Negative for covid, flu, rsv.  ?Return if symptoms worsen or fail to improve, for at any time for any worsening symptoms, Go to Emergency room/ urgent care if worse.  ?

## 2022-02-20 LAB — CULTURE, GROUP A STREP: Strep A Culture: NEGATIVE

## 2022-02-23 ENCOUNTER — Telehealth: Payer: Self-pay

## 2022-02-23 NOTE — Progress Notes (Signed)
Strep negative.

## 2022-02-23 NOTE — Telephone Encounter (Signed)
-----   Message from Doreen Beam, [place] sent at 02/23/2022  9:02 AM EDT ----- ?Strep negative.  ?

## 2022-03-04 ENCOUNTER — Other Ambulatory Visit (INDEPENDENT_AMBULATORY_CARE_PROVIDER_SITE_OTHER): Payer: Federal, State, Local not specified - PPO

## 2022-03-04 ENCOUNTER — Other Ambulatory Visit: Payer: Self-pay | Admitting: Adult Health

## 2022-03-04 ENCOUNTER — Other Ambulatory Visit: Payer: Self-pay

## 2022-03-04 DIAGNOSIS — Z833 Family history of diabetes mellitus: Secondary | ICD-10-CM | POA: Diagnosis not present

## 2022-03-04 DIAGNOSIS — E559 Vitamin D deficiency, unspecified: Secondary | ICD-10-CM | POA: Diagnosis not present

## 2022-03-04 DIAGNOSIS — E785 Hyperlipidemia, unspecified: Secondary | ICD-10-CM

## 2022-03-04 DIAGNOSIS — Z9229 Personal history of other drug therapy: Secondary | ICD-10-CM

## 2022-03-04 LAB — COMPREHENSIVE METABOLIC PANEL
ALT: 12 U/L (ref 0–35)
AST: 15 U/L (ref 0–37)
Albumin: 4 g/dL (ref 3.5–5.2)
Alkaline Phosphatase: 79 U/L (ref 39–117)
BUN: 19 mg/dL (ref 6–23)
CO2: 31 mEq/L (ref 19–32)
Calcium: 9.2 mg/dL (ref 8.4–10.5)
Chloride: 100 mEq/L (ref 96–112)
Creatinine, Ser: 0.83 mg/dL (ref 0.40–1.20)
GFR: 76.67 mL/min (ref >60.00)
Glucose, Bld: 88 mg/dL (ref 70–99)
Potassium: 4.1 mEq/L (ref 3.5–5.1)
Sodium: 138 mEq/L (ref 135–145)
Total Bilirubin: 0.5 mg/dL (ref 0.2–1.2)
Total Protein: 6.8 g/dL (ref 6.0–8.3)

## 2022-03-04 LAB — VITAMIN D 25 HYDROXY (VIT D DEFICIENCY, FRACTURES): VITD: 25.35 ng/mL — ABNORMAL LOW (ref 30.00–100.00)

## 2022-03-04 LAB — HEMOGLOBIN A1C: Hgb A1c MFr Bld: 6.1 % (ref 4.6–6.5)

## 2022-03-04 LAB — LIPID PANEL
Cholesterol: 177 mg/dL (ref 0–200)
HDL: 54.1 mg/dL (ref >39.00)
LDL Cholesterol: 84 mg/dL (ref 0–99)
NonHDL: 122.86
Total CHOL/HDL Ratio: 3
Triglycerides: 194 mg/dL — ABNORMAL HIGH (ref 0.0–149.0)
VLDL: 38.8 mg/dL (ref 0.0–40.0)

## 2022-03-04 NOTE — Progress Notes (Signed)
Added on CMP she needs to check liver function with statin, if can not be added please have her come to have checked.  ?Cholesterol has improved, hemoglobin A 1 C is mildly elevated, she was on steroids though in march, triglycerides still elevated, will improve with better glucose control.  ?Diet and exercise advised and follow up with new PCP within 3 months.

## 2022-03-04 NOTE — Progress Notes (Signed)
Recommend vitamin D without yellow dyes if able to find at vitamin D3 take 4,000 international units by mouth once daily have lab rechecked with new PCP in 3 months.

## 2022-03-30 ENCOUNTER — Encounter: Payer: Federal, State, Local not specified - PPO | Admitting: Family

## 2022-05-04 ENCOUNTER — Other Ambulatory Visit: Payer: Federal, State, Local not specified - PPO

## 2022-05-12 ENCOUNTER — Encounter: Payer: Self-pay | Admitting: Family

## 2022-05-12 ENCOUNTER — Ambulatory Visit: Payer: Federal, State, Local not specified - PPO | Admitting: Family

## 2022-05-12 VITALS — BP 126/64 | HR 93 | Temp 98.3°F | Resp 16 | Ht 62.99 in | Wt 135.1 lb

## 2022-05-12 DIAGNOSIS — M8580 Other specified disorders of bone density and structure, unspecified site: Secondary | ICD-10-CM | POA: Diagnosis not present

## 2022-05-12 DIAGNOSIS — E785 Hyperlipidemia, unspecified: Secondary | ICD-10-CM

## 2022-05-12 DIAGNOSIS — J029 Acute pharyngitis, unspecified: Secondary | ICD-10-CM

## 2022-05-12 DIAGNOSIS — E559 Vitamin D deficiency, unspecified: Secondary | ICD-10-CM

## 2022-05-12 DIAGNOSIS — E039 Hypothyroidism, unspecified: Secondary | ICD-10-CM

## 2022-05-12 DIAGNOSIS — N3010 Interstitial cystitis (chronic) without hematuria: Secondary | ICD-10-CM

## 2022-05-12 DIAGNOSIS — Z01419 Encounter for gynecological examination (general) (routine) without abnormal findings: Secondary | ICD-10-CM | POA: Diagnosis not present

## 2022-05-12 DIAGNOSIS — Z1389 Encounter for screening for other disorder: Secondary | ICD-10-CM | POA: Diagnosis not present

## 2022-05-12 DIAGNOSIS — F411 Generalized anxiety disorder: Secondary | ICD-10-CM

## 2022-05-12 NOTE — Assessment & Plan Note (Signed)
Continue daily vitamin D otc

## 2022-05-12 NOTE — Assessment & Plan Note (Signed)
Continue crestor 5 mg  Ordered lipid panel, pending results. Work on low cholesterol diet and exercise as tolerated  

## 2022-05-12 NOTE — Assessment & Plan Note (Signed)
Continue current dose synthroid Cont f/u with endo as scheduled

## 2022-05-12 NOTE — Assessment & Plan Note (Signed)
Reviewed latest urology note Could do trial of elavil in the future and or pelvic floor therapy Pt will consider

## 2022-05-12 NOTE — Assessment & Plan Note (Addendum)
Strep tested in office, negative Warm salt water gargles   

## 2022-05-12 NOTE — Progress Notes (Signed)
Established Patient Office Visit  Subjective:  Patient ID: Nicole Henry, female    DOB: 01-30-1961  Age: 61 y.o. MRN: [id number]  CC:  Chief Complaint  Patient presents with   Transitions Of Care    HPI Nicole Henry is here for a transition of care visit.  Prior provider was: Laverna Peace, FNP Pt is without acute concerns.   Had a pap smear this am with her gyn.   Colonoscopy: due after 06/17/22  chronic concerns:  HLD: crestor 5 mg at night, no longer with muscle pains. Working on low cholesterol diet and tolerating well.   Hypothyroidism: on synthroid 50 mcg once daily, brand name only, doing well. No recent weight issues, no fatigue, no joint pains. Does see an endocrinologist for this. No known goiter or nodule. Sees her once yearly. Sees Dr. Philemon Kingdom, endo.  Wt Readings from Last 3 Encounters:  05/12/22 135 lb 1 oz (61.3 kg)  02/17/22 132 lb (59.9 kg)  11/24/21 132 lb 3.2 oz (60 kg)   GAD: sertraline 50 mg once daily. Doing well.      05/12/2022    3:33 PM 11/24/2021    4:15 PM 07/26/2020    2:39 PM 03/30/2019   10:44 AM  GAD 7 : Generalized Anxiety Score  Nervous, Anxious, on Edge 0 0 0 0  Control/stop worrying 0 0 0 0  Worry too much - different things 0 0 0 0  Trouble relaxing 0 0 0 0  Restless 0 0 0 0  Easily annoyed or irritable 0 0 0 0  Afraid - awful might happen 0 0 0 0  Total GAD 7 Score 0 0 0 0  Anxiety Difficulty Not difficult at all Not difficult at all  Not difficult at all       05/12/2022    3:33 PM 11/24/2021    4:15 PM 07/26/2020    2:39 PM 03/30/2019   10:44 AM  GAD 7 : Generalized Anxiety Score  Nervous, Anxious, on Edge 0 0 0 0  Control/stop worrying 0 0 0 0  Worry too much - different things 0 0 0 0  Trouble relaxing 0 0 0 0  Restless 0 0 0 0  Easily annoyed or irritable 0 0 0 0  Afraid - awful might happen 0 0 0 0  Total GAD 7 Score 0 0 0 0  Anxiety Difficulty Not difficult at all Not difficult at all  Not  difficult at all    Osteopenia: has order for bone density, is to call and schedule appt   Past Medical History:  Diagnosis Date   Allergic rhinitis due to pollen    Basal cell carcinoma    history of   BASAL CELL CARCINOMA, HX OF 11/22/2008   Qualifier: Diagnosis of  By: Diona Browner MD, Amy     Family history of diabetes mellitus    GERD (gastroesophageal reflux disease)    Hypothyroidism    Pure hypercholesterolemia    Rosacea    Screening for lipoid disorders     Past Surgical History:  Procedure Laterality Date   AUGMENTATION MAMMAPLASTY Bilateral    1999   BREAST ENHANCEMENT SURGERY  1997    Family History  Problem Relation Age of Onset   Hypertension Mother    Hyperlipidemia Mother    Hyperlipidemia Father    Hypertension Father    Prostate cancer Father    Heart disease Brother        ?  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   Diet- fruits and veggies.   Social Determinants of Health   Financial Resource Strain: Not on file  Food Insecurity: Not on file  Transportation Needs: Not on file  Physical Activity: Not on file  Stress: Not on file  Social Connections: Not on file  Intimate Partner Violence: Not on file     Outpatient Medications Prior to Visit  Medication Sig Dispense Refill   rosuvastatin (CRESTOR) 5 MG tablet Take 1 tablet (5 mg total) by mouth daily. 90 tablet 1   sertraline (ZOLOFT) 50 MG tablet TAKE 1 TABLET BY MOUTH EVERY DAY. Establish further refills 90 tablet 1   SYNTHROID 50 MCG tablet Take 2 tablets by mouth every day before breakfast 180 tablet 3   predniSONE (STERAPRED UNI-PAK 21 TAB) 10 MG (21) TBPK tablet PO: Take 6 tablets on day 1:Take 5 tablets day 2:Take 4 tablets day 3: Take 3 tablets day 4:Take 2 tablets day five: Take 1 tablet day 6 (Patient not taking: Reported on 05/12/2022) 21 tablet 0   No facility-administered medications prior to visit.    Allergies  Allergen Reactions   Ciprofloxacin     Tolerable per pt  Was given in 2019   Nitrofurantoin Other (See Comments)    unknown   Simvastatin Other (See Comments)    myaglias   Penicillins Rash   Sulfonamide Derivatives Rash   Yellow Dyes (Non-Tartrazine) Rash        Objective:    Physical Exam Vitals reviewed.  Constitutional:      General: She is not in acute distress.    Appearance: Normal appearance. She is not ill-appearing or toxic-appearing.  HENT:     Right Ear: Tympanic membrane normal.     Left Ear: Tympanic membrane normal.     Mouth/Throat:     Mouth: Mucous membranes are moist.     Pharynx: Posterior oropharyngeal erythema present. No pharyngeal swelling.     Tonsils: No tonsillar exudate or tonsillar abscesses. 1+ on the right. 1+ on the left.  Eyes:     Extraocular Movements: Extraocular movements intact.     Conjunctiva/sclera: Conjunctivae normal.     Pupils: Pupils are equal, round, and reactive to light.  Neck:     Thyroid: No thyroid mass.  Cardiovascular:     Rate and Rhythm: Normal rate and regular rhythm.  Pulmonary:     Effort: Pulmonary effort is normal.     Breath sounds: Normal breath sounds.  Abdominal:  General: Abdomen is flat. Bowel sounds are normal.      Palpations: Abdomen is soft.  Musculoskeletal:        General: Normal range of motion.  Lymphadenopathy:     Cervical:     Right cervical: No superficial cervical adenopathy.    Left cervical: No superficial cervical adenopathy.  Skin:    General: Skin is warm.     Capillary Refill: Capillary refill takes less than 2 seconds.  Neurological:     General: No focal deficit present.     Mental Status: She is alert and oriented to person, place, and time.  Psychiatric:        Mood and Affect: Mood normal.        Behavior: Behavior normal.        Thought Content: Thought content normal.        Judgment: Judgment normal.      BP 126/64   Pulse 93   Temp 98.3 F (36.8 C)   Resp 16   Ht 5' 2.99" (1.6 m)   Wt 135 lb 1 oz (61.3 kg)   SpO2 98%   BMI 23.93 kg/m  Wt Readings from Last 3 Encounters:  05/12/22 135 lb 1 oz (61.3 kg)  02/17/22 132 lb (59.9 kg)  11/24/21 132 lb 3.2 oz (60 kg)     There are no preventive care reminders to display for this patient.   There are no preventive care reminders to display for this patient.  Lab Results  Component Value Date   TSH 0.62 01/07/2022   Lab Results  Component Value Date   WBC 4.4 01/07/2022   HGB 12.5 01/07/2022   HCT 38.7 01/07/2022   MCV 85.8 01/07/2022   PLT 336.0 01/07/2022   Lab Results  Component Value Date   NA 138 03/04/2022   K 4.1 03/04/2022   CO2 31 03/04/2022   GLUCOSE 88 03/04/2022   BUN 19 03/04/2022   CREATININE 0.83 03/04/2022   BILITOT 0.5 03/04/2022   ALKPHOS 79 03/04/2022   AST 15 03/04/2022   ALT 12 03/04/2022   PROT 6.8 03/04/2022   ALBUMIN 4.0 03/04/2022   CALCIUM 9.2 03/04/2022   ANIONGAP 8 02/20/2019   GFR 76.67 03/04/2022   Lab Results  Component Value Date   CHOL 177 03/04/2022   Lab Results  Component Value Date   HDL 54.10 03/04/2022   Lab Results  Component Value Date   LDLCALC 84 03/04/2022   Lab Results  Component Value Date   TRIG 194.0 (H) 03/04/2022   Lab  Results  Component Value Date   CHOLHDL 3 03/04/2022   Lab Results  Component Value Date   HGBA1C 6.1 03/04/2022      Assessment & Plan:   Problem List Items Addressed This Visit       Endocrine   Hypothyroidism    Continue current dose synthroid Cont f/u with endo as scheduled         Musculoskeletal and Integument   Osteopenia    Daily vitamin D and calcium         Genitourinary   Interstitial cystitis    Reviewed latest urology note Could do trial of elavil in the future and or pelvic floor therapy Pt will consider         Other   Vitamin D deficiency    Continue daily vitamin D otc       Hyperlipidemia    Continue crestor 5 mg  Ordered lipid  panel, pending results. Work on low cholesterol diet and exercise as tolerated        Relevant Orders   Lipid panel   GAD (generalized anxiety disorder) - Primary    Continue sertraline 50 mg daily Stable Work on anxiety reducing techniques       Sore throat    Strep tested in office, negative Warm salt water gargles         Relevant Orders   POCT rapid strep A    No orders of the defined types were placed in this encounter.   Follow-up: No follow-ups on file.    Eugenia Pancoast, FNP

## 2022-05-12 NOTE — Assessment & Plan Note (Signed)
Daily vitamin D and calcium

## 2022-05-12 NOTE — Assessment & Plan Note (Signed)
Continue sertraline 50 mg daily Stable Work on anxiety reducing techniques

## 2022-05-12 NOTE — Patient Instructions (Addendum)
Recommend shingrix vaccination.   I have created an order for lab work today during our visit.  Please schedule an appointment on your way out to return to the lab at your convenience. Please return fasting at your lab appointment after June 22nd (meaning you can only drink black coffee and or water prior to your appointment). I will reach out to you in regards to the labs when I receive the results.   Welcome to our clinic, I am happy to have you as my new patient. I am excited to continue on this healthcare journey with you.  Stop by the lab prior to leaving today. I will notify you of your results once received.   Please keep in mind Any my chart messages you send have up to a three business day turnaround for a response.  Phone calls may take up to a one full business day turnaround for a  response.   If you need a medication refill I recommend you request it through the pharmacy as this is easiest for [place] rather than sending a message and or phone call.   Due to recent changes in healthcare laws, you may see results of your imaging and/or laboratory studies on MyChart before I have had a chance to review them.  I understand that in some cases there may be results that are confusing or concerning to you. Please understand that not all results are received at the same time and often I may need to interpret multiple results in order to provide you with the best plan of care or course of treatment. Therefore, I ask that you please give me 2 business days to thoroughly review all your results before contacting my office for clarification. Should we see a critical lab result, you will be contacted sooner.   It was a pleasure seeing you today! Please do not hesitate to reach out with any questions and or concerns.  Regards,   Eugenia Pancoast FNP-C

## 2022-06-30 DIAGNOSIS — D1801 Hemangioma of skin and subcutaneous tissue: Secondary | ICD-10-CM | POA: Diagnosis not present

## 2022-06-30 DIAGNOSIS — L570 Actinic keratosis: Secondary | ICD-10-CM | POA: Diagnosis not present

## 2022-06-30 DIAGNOSIS — D2262 Melanocytic nevi of left upper limb, including shoulder: Secondary | ICD-10-CM | POA: Diagnosis not present

## 2022-06-30 DIAGNOSIS — L814 Other melanin hyperpigmentation: Secondary | ICD-10-CM | POA: Diagnosis not present

## 2022-06-30 DIAGNOSIS — Z85828 Personal history of other malignant neoplasm of skin: Secondary | ICD-10-CM | POA: Diagnosis not present

## 2022-07-02 ENCOUNTER — Encounter: Payer: Self-pay | Admitting: Internal Medicine

## 2022-07-02 ENCOUNTER — Ambulatory Visit: Payer: Federal, State, Local not specified - PPO | Admitting: Internal Medicine

## 2022-07-02 VITALS — BP 124/72 | HR 75 | Ht 62.99 in | Wt 134.0 lb

## 2022-07-02 DIAGNOSIS — E038 Other specified hypothyroidism: Secondary | ICD-10-CM

## 2022-07-02 DIAGNOSIS — E559 Vitamin D deficiency, unspecified: Secondary | ICD-10-CM | POA: Diagnosis not present

## 2022-07-02 DIAGNOSIS — R7303 Prediabetes: Secondary | ICD-10-CM | POA: Diagnosis not present

## 2022-07-02 DIAGNOSIS — M810 Age-related osteoporosis without current pathological fracture: Secondary | ICD-10-CM | POA: Diagnosis not present

## 2022-07-02 DIAGNOSIS — E063 Autoimmune thyroiditis: Secondary | ICD-10-CM

## 2022-07-02 LAB — TSH: TSH: 0.02 u[IU]/mL — ABNORMAL LOW (ref 0.35–5.50)

## 2022-07-02 LAB — HEMOGLOBIN A1C: Hgb A1c MFr Bld: 6.1 % (ref 4.6–6.5)

## 2022-07-02 LAB — T4, FREE: Free T4: 0.94 ng/dL (ref 0.60–1.60)

## 2022-07-02 MED ORDER — SYNTHROID 50 MCG PO TABS: ORAL_TABLET | ORAL | 3 refills | Status: DC

## 2022-07-02 NOTE — Progress Notes (Signed)
Patient ID: Nicole Henry, female   DOB: 06-Apr-1961, 61 y.o.   MRN: [id number]   HPI  Nicole Henry is a 61 y.o.-year-old female, returning for follow-up for uncontrolled Hashimoto's hypothyroidism, vitamin D deficiency, osteoporosis. Last visit 1 year ago.  Interim history: At last visit she was prepared to start a compounded estradiol preparation.  She previously tried a regular estradiol tablet and had a reaction to the dyes. She previously had hair loss, constipation, and fatigue, but these resolved. Since last visit she was diagnosed with interstitial cystitis.  Also, with prediabetes. No falls or fractures since last visit.  Reviewed history: Pt. has been dx with hypothyroidism in 2009 (fatigue, TSH 16) >> started on Synthroid d.a.w.  She cannot take levothyroxine with dyes, therefore, she is on white, 50 mcg, tablets.  She is currently on Synthroid d.a.w. 100 mcg daily (dose increased at last visit): - in am - fasting - at least 30 min from b'fast - no Ca, Fe - + MVI, PPIs -later in the day-stopped - not on Biotin  TFTs reviewed: Lab Results  Component Value Date   TSH 0.62 01/07/2022   TSH 4.49 07/01/2021   TSH 2.50 07/01/2020   TSH 0.12 (L) 09/28/2019   TSH 11.15 (H) 07/03/2019   TSH 0.49 12/29/2018   TSH 0.48 11/21/2018   TSH 0.08 (L) 10/03/2018   TSH 32.97 (H) 06/27/2018   TSH 0.10 (L) 03/08/2018   FREET4 0.96 07/01/2021   FREET4 0.60 07/01/2020   FREET4 1.26 09/28/2019   FREET4 0.70 07/03/2019   FREET4 1.00 12/29/2018   FREET4 0.80 11/21/2018   FREET4 1.10 10/03/2018   FREET4 0.59 (L) 06/27/2018   FREET4 0.84 03/08/2018   FREET4 0.85 01/20/2018   T3FREE 2.8 03/08/2018   T3FREE 2.7 07/25/2014   She had elevated antithyroid antibodies: Lab Results  Component Value Date   THGAB 9 (H) 11/21/2018   THGAB 9 (H) 06/27/2018   Component     Latest Ref Rng & Units 06/27/2018 11/21/2018  Thyroperoxidase Ab SerPl-aCnc     0 - 34 IU/mL 127 (H) 61 (H)   No FH  of thyroid cancer. No h/o radiation tx to head or neck. No Biotin use. No recent steroids use.   Vitamin D deficiency:  Reviewed vitamin D levels: Lab Results  Component Value Date   VD25OH 25.35 (L) 03/04/2022   VD25OH 30.84 01/07/2022   VD25OH 34.3 07/01/2021   VD25OH 32.2 07/01/2020   VD25OH 36.53 07/03/2019   VD25OH 26.82 (L) 11/21/2018   VD25OH 30.35 10/03/2018   VD25OH 23.38 (L) 06/27/2018   Previously on vitamin D3 5000 units daily + multivitamin, but these were stopped after last visit  -at last visit we discussed about restarting them.  Osteoporosis:  Reviewed previous bone density reports: DXA (09/01/2018) - [facility]: RFN 09/01/2018        -2.4   RFN 06/29/2016        -2.5  LFN 09/01/2018       -2.3 LFN 06/29/2016       -2.2  AP Spine  L1-L4      09/01/2018    56.8         -1.4    1.015 g/cm2 (+1%) AP Spine  L1-L4      06/29/2016    54.6         -1.5    1.005 g/cm2  DualFemur Total Mean 09/01/2018    56.8         -  1.3    0.838 g/cm2 DualFemur Total Mean 06/29/2016    54.6         -1.5    0.824 g/cm2  FRAX: Major osteoporosis fracture risk over 10 years: 9.5% Hip fracture risk over 10 years: 1.6%  Stable T-scores, in the osteopenic range.  At last visit, I ordered another bone density scan, but she did not have this yet -she had to cancel it due to a conflict with her schedule at work.  She also has a history of hyperlipidemia.  She is on Crestor.  She also has a history of high HbA1c -prediabetes: Lab Results  Component Value Date   HGBA1C 6.1 03/04/2022   HGBA1C 5.9 01/07/2022   HGBA1C 6.0 07/01/2021   ROS: + see HPI  I reviewed pt's medications, allergies, PMH, social hx, family hx, and changes were documented in the history of present illness. Otherwise, unchanged from my initial visit note.  Past Medical History:  Diagnosis Date   Allergic rhinitis due to pollen    Basal cell carcinoma    history of   BASAL CELL CARCINOMA, HX OF 11/22/2008    Qualifier: Diagnosis of  By: Diona Browner MD, Amy     Family history of diabetes mellitus    GERD (gastroesophageal reflux disease)    Hypothyroidism    Pure hypercholesterolemia    Rosacea    Screening for lipoid disorders    Past Surgical History:  Procedure Laterality Date   AUGMENTATION MAMMAPLASTY Bilateral    1999   BREAST ENHANCEMENT SURGERY  1997   Social History   Socioeconomic History   Marital status: Married    Spouse name: Not on file   Number of children: 2   Years of education: Not on file   Highest education level: Not on file  Occupational History    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   Diet- fruits and veggies.   Social Determinants of Health   Financial Resource Strain: Not on file  Food Insecurity: Not on file  Transportation Needs: Not on file  Physical Activity: Not on file  Stress: Not on file  Social Connections: Not on file  Intimate Partner Violence: Not on file   Current Outpatient Medications on File Prior to Visit  Medication Sig Dispense Refill   rosuvastatin (CRESTOR) 5 MG tablet Take 1 tablet (5 mg total) by mouth daily. 90 tablet 1   sertraline (ZOLOFT) 50 MG tablet TAKE 1 TABLET BY MOUTH EVERY DAY. Establish further refills 90 tablet 1   SYNTHROID 50 MCG tablet Take 2 tablets by mouth every day before breakfast 180 tablet 3   No current facility-administered medications on file prior to visit.   Allergies  Allergen Reactions   Ciprofloxacin     Tolerable per pt  Was given in 2019   Nitrofurantoin Other (See  Comments)    unknown   Simvastatin Other (See Comments)    myaglias   Penicillins Rash   Sulfonamide Derivatives Rash  Yellow Dyes (Non-Tartrazine) Rash   Family History  Problem Relation Age of Onset   Hypertension Mother    Hyperlipidemia Mother    Hyperlipidemia Father    Hypertension Father    Prostate cancer Father    Heart disease Brother        ? heart issue   Heart attack Paternal Grandfather        late age   Bladder Cancer Neg Hx    Kidney cancer Neg Hx     PE: BP 124/72 (BP Location: Right Arm, Patient Position: Sitting, Cuff Size: Normal)   Pulse 75   Ht 5' 2.99" (1.6 m)   Wt 134 lb (60.8 kg)   SpO2 99%   BMI 23.74 kg/m  Wt Readings from Last 3 Encounters:  07/02/22 134 lb (60.8 kg)  05/12/22 135 lb 1 oz (61.3 kg)  02/17/22 132 lb (59.9 kg)   Constitutional: normal weight, in NAD Eyes: PERRLA, EOMI, no exophthalmos ENT: moist mucous membranes, no thyromegaly, no cervical lymphadenopathy Cardiovascular: RRR, No MRG Respiratory: CTA B Musculoskeletal: no deformities Skin: moist, warm, no rashes Neurological: no tremor with outstretched hands  ASSESSMENT: 1. Hypothyroidism due to Hashimoto's thyroiditis  2. Osteoporosis   3. Vit D def  4. Prediabetes  PLAN:  1.  Patient with longstanding hypothyroidism, on Synthroid brand name 50 mcg tablets due to the lack of dyes - latest thyroid labs reviewed with pt. >> normal: Lab Results  Component Value Date   TSH 0.62 01/07/2022  - she continues on LT4 100 mcg daily, increased at last visit - pt feels good on this dose. - we discussed about taking the thyroid hormone every day, with water, >30 minutes before breakfast, separated by >4 hours from acid reflux medications, calcium, iron, multivitamins. Pt. is taking it correctly. - will check thyroid tests today: TSH and fT4 - If labs are abnormal, she will need to return for repeat TFTs in 1.5 months  2. Osteoporosis  -Reviewed previous bone density  scan reports.  At her check from 2019, the T-scores were stable/slightly improved -She continues to walk/run and we also discussed about the importance of weightbearing exercises -We did discuss about different medication classes, benefits, and side effects.  She discussed about this with her dentist and he strongly discouraged the use... -She is due for another bone density scan.  This was ordered at last visit but she did not have this done yet -I will order this again today  3.  Vitamin D deficiency -At last visit she was off her multivitamin +5000 units vitamin D daily supplement.  She stopped these due to her reaction to dyes in her estradiol formulation. -At last visit, vitamin D level was normal, but she had another vitamin D checked in 02/2022 and this was low, 25.35. -At this visit, I advised her to restart vitamin D3: 2000 units daily  4.  Prediabetes -She had a higher HbA1c at last visit, and this was confirmed on 2 subsequent checks -We discussed that prediabetes condition is reversible with improvement in her diet (which she already started to do) and consistent exercise -She would want me to recheck her HbA1c today  Needs refills. Component     Latest Ref Rng 07/02/2022  TSH     0.35 - 5.50 uIU/mL 0.02 (L)   T4,Free(Direct)     0.60 - 1.60 ng/dL 0.94   Hemoglobin A1C     4.6 - 6.5 % 6.1     TSH is suppressed.  I will suggest to decrease the dose again:  100 alternating with 75 mcg every other daily (2 alt. With 1.5 tablet of the 50 mcg dose). Lower dose: 3x a week, higher dose: 4x a week.  Plan to recheck her TFTs in 1.5 months. HbA1c stable.  Philemon Kingdom, MD PhD [facility]

## 2022-07-02 NOTE — Patient Instructions (Addendum)
Please stop at the lab.  Please continue Synthroid 100 mcg daily.  Take the thyroid hormone every day, with water, at least 30 minutes before breakfast, separated by at least 4 hours from: - acid reflux medications - calcium - iron - multivitamins  Please start: - vitamin D3 2000 units daily.  Please come back for a follow-up appointment in 1 year.

## 2022-08-04 ENCOUNTER — Other Ambulatory Visit: Payer: Self-pay

## 2022-08-04 DIAGNOSIS — F419 Anxiety disorder, unspecified: Secondary | ICD-10-CM

## 2022-08-05 MED ORDER — SERTRALINE HCL 50 MG PO TABS: ORAL_TABLET | ORAL | 1 refills | Status: DC

## 2022-08-21 ENCOUNTER — Encounter: Payer: Self-pay | Admitting: Internal Medicine

## 2022-10-01 ENCOUNTER — Other Ambulatory Visit: Payer: Self-pay

## 2022-10-01 MED ORDER — COVID-19 MRNA 2023-2024 VACCINE (COMIRNATY) 0.3 ML INJECTION
INTRAMUSCULAR | 0 refills | Status: DC
  Filled 2022-10-01: qty 0.3, 1d supply, fill #0

## 2022-10-01 MED ORDER — INFLUENZA VAC SPLIT QUAD 0.5 ML IM SUSY
PREFILLED_SYRINGE | INTRAMUSCULAR | 0 refills | Status: DC
  Filled 2022-10-01: qty 0.5, 1d supply, fill #0

## 2022-10-02 ENCOUNTER — Other Ambulatory Visit: Payer: Self-pay

## 2022-10-07 ENCOUNTER — Other Ambulatory Visit (INDEPENDENT_AMBULATORY_CARE_PROVIDER_SITE_OTHER): Payer: Federal, State, Local not specified - PPO

## 2022-10-07 DIAGNOSIS — E038 Other specified hypothyroidism: Secondary | ICD-10-CM

## 2022-10-07 DIAGNOSIS — Z85828 Personal history of other malignant neoplasm of skin: Secondary | ICD-10-CM | POA: Diagnosis not present

## 2022-10-07 DIAGNOSIS — E063 Autoimmune thyroiditis: Secondary | ICD-10-CM

## 2022-10-07 DIAGNOSIS — L821 Other seborrheic keratosis: Secondary | ICD-10-CM | POA: Diagnosis not present

## 2022-10-07 LAB — T4, FREE: Free T4: 1 ng/dL (ref 0.60–1.60)

## 2022-10-07 LAB — TSH: TSH: 1.72 u[IU]/mL (ref 0.35–5.50)

## 2022-12-29 ENCOUNTER — Ambulatory Visit
Admission: RE | Admit: 2022-12-29 | Discharge: 2022-12-29 | Disposition: A | Discharge status: Home / Self Care | Payer: Federal, State, Local not specified - PPO | Source: Ambulatory Visit | Attending: Internal Medicine | Admitting: Internal Medicine

## 2022-12-29 DIAGNOSIS — M810 Age-related osteoporosis without current pathological fracture: Secondary | ICD-10-CM | POA: Diagnosis not present

## 2022-12-29 DIAGNOSIS — M8588 Other specified disorders of bone density and structure, other site: Secondary | ICD-10-CM | POA: Diagnosis not present

## 2022-12-29 DIAGNOSIS — Z780 Asymptomatic menopausal state: Secondary | ICD-10-CM | POA: Diagnosis not present

## 2023-01-04 ENCOUNTER — Encounter: Payer: Federal, State, Local not specified - PPO | Admitting: Family

## 2023-01-28 DIAGNOSIS — H04123 Dry eye syndrome of bilateral lacrimal glands: Secondary | ICD-10-CM | POA: Diagnosis not present

## 2023-02-03 ENCOUNTER — Other Ambulatory Visit: Payer: Self-pay | Admitting: Family

## 2023-02-03 DIAGNOSIS — H53143 Visual discomfort, bilateral: Secondary | ICD-10-CM | POA: Diagnosis not present

## 2023-02-03 DIAGNOSIS — F419 Anxiety disorder, unspecified: Secondary | ICD-10-CM

## 2023-02-03 NOTE — Telephone Encounter (Signed)
Please advise pt to schedule f/u in May for annual physical exam and for future refills of sertraline, as I like to see my patients on these medications at least once yearly. Sent in 90 days supply for refill.

## 2023-02-03 NOTE — Telephone Encounter (Signed)
LV- 01/04/23; NV- not scheduled; LR- 08/05/22 (90tabs/1 refill). Ok to fill med? Sertraline (ZOLOFT) 50 MG tablet

## 2023-02-05 NOTE — Telephone Encounter (Signed)
Appt has been scheduled.

## 2023-02-10 ENCOUNTER — Ambulatory Visit: Payer: Federal, State, Local not specified - PPO | Admitting: Nurse Practitioner

## 2023-03-21 ENCOUNTER — Ambulatory Visit
Admission: EM | Admit: 2023-03-21 | Discharge: 2023-03-21 | Disposition: A | Discharge status: Home / Self Care | Payer: Federal, State, Local not specified - PPO | Attending: Emergency Medicine | Admitting: Emergency Medicine

## 2023-03-21 DIAGNOSIS — J0100 Acute maxillary sinusitis, unspecified: Secondary | ICD-10-CM

## 2023-03-21 DIAGNOSIS — H6691 Otitis media, unspecified, right ear: Secondary | ICD-10-CM

## 2023-03-21 MED ORDER — DOXYCYCLINE HYCLATE 100 MG PO CAPS: 100.0000 mg | ORAL_CAPSULE | Freq: Two times a day (BID) | ORAL | 0 refills | Status: DC

## 2023-03-21 NOTE — ED Triage Notes (Addendum)
Patient presents to UC for cough and congestion x 4 days. Right ear pain since yesterday. Treating symptoms with tylenol.

## 2023-03-21 NOTE — ED Provider Notes (Signed)
Nicole Henry    CSN: [id number] Arrival date & time: 03/21/23  1040      History   Chief Complaint Chief Complaint  Patient presents with   Otalgia   Cough   Nasal Congestion    HPI Nicole Henry is a 62 y.o. female.  Patient presents with right ear pain x 1 day.  She also has sinus pressure, congestion, postnasal drip, mild cough x 4 days.  No fever, rash, shortness of breath, vomiting, diarrhea, or other symptoms.  Treatment at home with Tylenol.  Her medical history includes allergies.  The history is provided by the patient and medical records.    Past Medical History:  Diagnosis Date   Allergic rhinitis due to pollen    Basal cell carcinoma    history of   BASAL CELL CARCINOMA, HX OF 11/22/2008   Qualifier: Diagnosis of  By: Ermalene Searing MD, Amy     Family history of diabetes mellitus    GERD (gastroesophageal reflux disease)    Hypothyroidism    Pure hypercholesterolemia    Rosacea    Screening for lipoid disorders     Patient Active Problem List   Diagnosis Date Noted   GAD (generalized anxiety disorder) 05/12/2022   Interstitial cystitis 05/12/2022   Osteopenia 05/12/2022   Sore throat 05/12/2022   Hyperlipidemia 01/13/2022   Vitamin D deficiency 11/24/2021   Hypothyroidism 01/14/2008    Past Surgical History:  Procedure Laterality Date   AUGMENTATION MAMMAPLASTY Bilateral    1999   BREAST ENHANCEMENT SURGERY  1997    OB History   No obstetric history on file.      Home Medications    Prior to Admission medications   Medication Sig Start Date End Date Taking? Authorizing Provider  doxycycline (VIBRAMYCIN) 100 MG capsule Take 1 capsule (100 mg total) by mouth 2 (two) times daily. 03/21/23  Yes Mickie Bail, NP  COVID-19 mRNA vaccine [phone] (COMIRNATY) SUSP injection Inject into the muscle. 10/01/22   Judyann Munson, MD  influenza vac split quadrivalent PF (FLUARIX) 0.5 ML injection Inject into the muscle. 10/01/22   Judyann Munson,  MD  rosuvastatin (CRESTOR) 5 MG tablet Take 1 tablet (5 mg total) by mouth daily. 01/13/22   Flinchum, Eula Fried, FNP  sertraline (ZOLOFT) 50 MG tablet TAKE 1 TABLET BY MOUTH EVERY DAY 02/03/23   Mort Sawyers, FNP  SYNTHROID 50 MCG tablet Take 2 tablets by mouth every day before breakfast 07/02/22   Carlus Pavlov, MD    Family History Family History  Problem Relation Age of Onset   Hypertension Mother    Hyperlipidemia Mother    Hyperlipidemia Father    Hypertension Father    Prostate cancer Father    Heart disease Brother        ? heart issue   Heart attack Paternal Grandfather        late age   Bladder Cancer Neg Hx    Kidney cancer Neg Hx     Social History Social History   Tobacco Use   Smoking status: Never   Smokeless tobacco: Never  Vaping Use   Vaping Use: Never used  Substance Use Topics   Alcohol use: No   Drug use: No     Allergies   Ciprofloxacin, Nitrofurantoin, Simvastatin, Penicillins, Sulfonamide derivatives, and Yellow dyes (non-tartrazine)   Review of Systems Review of Systems  Constitutional:  Negative for chills and fever.  HENT:  Positive for congestion, ear pain, postnasal drip, rhinorrhea  and sinus pressure. Negative for ear discharge and sore throat.   Respiratory:  Positive for cough. Negative for shortness of breath.   Cardiovascular:  Negative for chest pain and palpitations.  Gastrointestinal:  Negative for diarrhea and vomiting.  All other systems reviewed and are negative.    Physical Exam Triage Vital Signs ED Triage Vitals  Enc Vitals Group     BP 03/21/23 1128 137/82     Pulse Rate 03/21/23 1128 79     Resp 03/21/23 1128 16     Temp 03/21/23 1128 98.6 F (37 C)     Temp Source 03/21/23 1128 Temporal     SpO2 03/21/23 1128 98 %     Weight --      Height --      Head Circumference --      Peak Flow --      Pain Score 03/21/23 1116 4     Pain Loc --      Pain Edu? --      Excl. in GC? --    No data  found.  Updated Vital Signs BP 137/82 (BP Location: Left Arm)   Pulse 79   Temp 98.6 F (37 C) (Temporal)   Resp 16   SpO2 98%   Visual Acuity Right Eye Distance:   Left Eye Distance:   Bilateral Distance:    Right Eye Near:   Left Eye Near:    Bilateral Near:     Physical Exam Vitals and nursing note reviewed.  Constitutional:      General: She is not in acute distress.    Appearance: Normal appearance. She is well-developed. She is not ill-appearing.  HENT:     Right Ear: Ear canal normal. Tympanic membrane is erythematous and bulging.     Left Ear: Tympanic membrane and ear canal normal.     Nose: Congestion and rhinorrhea present.     Mouth/Throat:     Mouth: Mucous membranes are moist.     Pharynx: Posterior oropharyngeal erythema present.  Cardiovascular:     Rate and Rhythm: Normal rate and regular rhythm.     Heart sounds: Normal heart sounds.  Pulmonary:     Effort: Pulmonary effort is normal. No respiratory distress.     Breath sounds: Normal breath sounds.  Musculoskeletal:     Cervical back: Neck supple.  Skin:    General: Skin is warm and dry.  Neurological:     Mental Status: She is alert.  Psychiatric:        Mood and Affect: Mood normal.        Behavior: Behavior normal.      UC Treatments / Results  Labs (all labs ordered are listed, but only abnormal results are displayed) Labs Reviewed - No data to display  EKG   Radiology No results found.  Procedures Procedures (including critical care time)  Medications Ordered in UC Medications - No data to display  Initial Impression / Assessment and Plan / UC Course  I have reviewed the triage vital signs and the nursing notes.  Pertinent labs & imaging results that were available during my care of the patient were reviewed by me and considered in my medical decision making (see chart for details).   Right otitis media, sinusitis.  Patient is allergic to several medications and yellow  dye.  Treating with doxycycline.  Education provided on otitis media and sinus infection.  Instructed patient to follow up with her PCP if her symptoms are  not improving.  She agrees to plan of care.     Final Clinical Impressions(s) / UC Diagnoses   Final diagnoses:  Right otitis media, unspecified otitis media type  Acute non-recurrent maxillary sinusitis     Discharge Instructions      Take the doxycycline as directed.  Follow up with your primary care provider if your symptoms are not improving.        ED Prescriptions     Medication Sig Dispense Auth. Provider   doxycycline (VIBRAMYCIN) 100 MG capsule Take 1 capsule (100 mg total) by mouth 2 (two) times daily. 20 capsule Mickie Bail, NP      PDMP not reviewed this encounter.   Mickie Bail, NP 03/21/23 [phone]

## 2023-03-21 NOTE — Discharge Instructions (Addendum)
Take the doxycycline as directed.    Follow up with your primary care provider if your symptoms are not improving.    

## 2023-03-26 ENCOUNTER — Ambulatory Visit
Admission: EM | Admit: 2023-03-26 | Discharge: 2023-03-26 | Disposition: A | Discharge status: Home / Self Care | Payer: Federal, State, Local not specified - PPO | Attending: Urgent Care | Admitting: Urgent Care

## 2023-03-26 DIAGNOSIS — J0100 Acute maxillary sinusitis, unspecified: Secondary | ICD-10-CM

## 2023-03-26 MED ORDER — PREDNISONE 20 MG PO TABS
ORAL_TABLET | ORAL | 0 refills | Status: AC
Start: 2023-03-26 — End: 2023-03-31

## 2023-03-26 NOTE — ED Provider Notes (Signed)
[name]    CSN: [id number] Arrival date & time: 03/26/23  4098      History   Chief Complaint Chief Complaint  Patient presents with   Otalgia    Right ear     HPI MARYBELLA ETHIER is a 62 y.o. female.    Otalgia   Presents to urgent care with complaint of continued right ear pain.  Patient was seen 5 days ago for R AOM and sinusitis and prescribed doxycycline x 10 days.  She states she is on day 6 of the doxycycline and continues to have discomfort in the ear with associated "ringing".  She questions whether doxycycline has been effective.  Past Medical History:  Diagnosis Date   Allergic rhinitis due to pollen    Basal cell carcinoma    history of   BASAL CELL CARCINOMA, HX OF 11/22/2008   Qualifier: Diagnosis of  By: Ermalene Searing MD, Amy     Family history of diabetes mellitus    GERD (gastroesophageal reflux disease)    Hypothyroidism    Pure hypercholesterolemia    Rosacea    Screening for lipoid disorders     Patient Active Problem List   Diagnosis Date Noted   GAD (generalized anxiety disorder) 05/12/2022   Interstitial cystitis 05/12/2022   Osteopenia 05/12/2022   Sore throat 05/12/2022   Hyperlipidemia 01/13/2022   Vitamin D deficiency 11/24/2021   Hypothyroidism 01/14/2008    Past Surgical History:  Procedure Laterality Date   AUGMENTATION MAMMAPLASTY Bilateral    1999   BREAST ENHANCEMENT SURGERY  1997    OB History   No obstetric history on file.      Home Medications    Prior to Admission medications   Medication Sig Start Date End Date Taking? Authorizing Provider  COVID-19 mRNA vaccine 2023-2024 (COMIRNATY) SUSP injection Inject into the muscle. 10/01/22   Judyann Munson, MD  doxycycline (VIBRAMYCIN) 100 MG capsule Take 1 capsule (100 mg total) by mouth 2 (two) times daily. 03/21/23   Mickie Bail, NP  influenza vac split quadrivalent PF (FLUARIX) 0.5 ML injection Inject into the muscle. 10/01/22   Judyann Munson, MD   rosuvastatin (CRESTOR) 5 MG tablet Take 1 tablet (5 mg total) by mouth daily. 01/13/22   Flinchum, Eula Fried, FNP  sertraline (ZOLOFT) 50 MG tablet TAKE 1 TABLET BY MOUTH EVERY DAY 02/03/23   Mort Sawyers, FNP  SYNTHROID 50 MCG tablet Take 2 tablets by mouth every day before breakfast 07/02/22   Carlus Pavlov, MD    Family History Family History  Problem Relation Age of Onset   Hypertension Mother    Hyperlipidemia Mother    Hyperlipidemia Father    Hypertension Father    Prostate cancer Father    Heart disease Brother        ? heart issue   Heart attack Paternal Grandfather        late age   Bladder Cancer Neg Hx    Kidney cancer Neg Hx     Social History Social History   Tobacco Use   Smoking status: Never   Smokeless tobacco: Never  Vaping Use   Vaping Use: Never used  Substance Use Topics   Alcohol use: No   Drug use: No     Allergies   Ciprofloxacin, Nitrofurantoin, Simvastatin, Penicillins, Sulfonamide derivatives, and Yellow dyes (non-tartrazine)   Review of Systems Review of Systems  HENT:  Positive for ear pain.      Physical Exam Triage Vital  Signs ED Triage Vitals  Enc Vitals Group     BP      Pulse      Resp      Temp      Temp src      SpO2      Weight      Height      Head Circumference      Peak Flow      Pain Score      Pain Loc      Pain Edu?      Excl. in GC?    No data found.  Updated Vital Signs There were no vitals taken for this visit.  Visual Acuity Right Eye Distance:   Left Eye Distance:   Bilateral Distance:    Right Eye Near:   Left Eye Near:    Bilateral Near:     Physical Exam Vitals reviewed.  Constitutional:      Appearance: Normal appearance.  HENT:     Right Ear: Ear canal normal. A middle ear effusion is present. There is no impacted cerumen.  Skin:    General: Skin is warm and dry.  Neurological:     General: No focal deficit present.     Mental Status: She is alert and oriented to person,  place, and time.  Psychiatric:        Mood and Affect: Mood normal.        Behavior: Behavior normal.      UC Treatments / Results  Labs (all labs ordered are listed, but only abnormal results are displayed) Labs Reviewed - No data to display  EKG   Radiology No results found.  Procedures Procedures (including critical care time)  Medications Ordered in UC Medications - No data to display  Initial Impression / Assessment and Plan / UC Course  I have reviewed the triage vital signs and the nursing notes.  Pertinent labs & imaging results that were available during my care of the patient were reviewed by me and considered in my medical decision making (see chart for details).   CAMIA SCHAEDLER is a 62 y.o. female presenting with R otalgia. Patient is afebrile without recent antipyretics, satting well on room air. Overall is well appearing though non-toxic, well hydrated, without respiratory distress.  Right TM is not erythematous.  There is bulging, evidence of middle ear effusion.  Suspect right eustachian tube dysfunction secondary to sinusitis.  There is no more TM erythema, so the doxycycline appears to have been effective, however she continues to have sinus inflammation causing her discomfort.  Recommended that she continue the prescription of doxycycline.  Offered a course of prednisone to use speed resolution of the sinusitis.  Discussed alternative use of nasal decongestant such as Flonase and Sudafed, which I do not believe will be effective as quickly.  Counseled patient on potential for adverse effects with medications prescribed/recommended today, ER and return-to-clinic precautions discussed, patient verbalized understanding and agreement with care plan.   Final Clinical Impressions(s) / UC Diagnoses   Final diagnoses:  None   Discharge Instructions   None    ED Prescriptions   None    PDMP not reviewed this encounter.   Charma Igo, [place] 03/26/23  1003

## 2023-03-26 NOTE — ED Triage Notes (Signed)
Patient presents to UC for continued right ear pain. Seen on Sunday. Prescribed doxy antibiotic.

## 2023-03-26 NOTE — Discharge Instructions (Addendum)
Follow up with your primary care or an ENT provider if your symptoms are worsening or not improving.

## 2023-04-05 ENCOUNTER — Ambulatory Visit: Payer: Federal, State, Local not specified - PPO | Admitting: Family

## 2023-04-08 ENCOUNTER — Ambulatory Visit: Payer: Federal, State, Local not specified - PPO | Admitting: Family

## 2023-04-13 ENCOUNTER — Ambulatory Visit: Payer: Federal, State, Local not specified - PPO | Admitting: Family

## 2023-04-13 ENCOUNTER — Encounter: Payer: Self-pay | Admitting: Family

## 2023-04-13 VITALS — BP 118/72 | HR 82 | Temp 97.9°F | Ht 63.0 in | Wt 135.8 lb

## 2023-04-13 DIAGNOSIS — J020 Streptococcal pharyngitis: Secondary | ICD-10-CM | POA: Diagnosis not present

## 2023-04-13 DIAGNOSIS — J029 Acute pharyngitis, unspecified: Secondary | ICD-10-CM | POA: Diagnosis not present

## 2023-04-13 DIAGNOSIS — M791 Myalgia, unspecified site: Secondary | ICD-10-CM | POA: Diagnosis not present

## 2023-04-13 DIAGNOSIS — T466X5A Adverse effect of antihyperlipidemic and antiarteriosclerotic drugs, initial encounter: Secondary | ICD-10-CM

## 2023-04-13 DIAGNOSIS — E039 Hypothyroidism, unspecified: Secondary | ICD-10-CM

## 2023-04-13 DIAGNOSIS — Z789 Other specified health status: Secondary | ICD-10-CM | POA: Diagnosis not present

## 2023-04-13 LAB — POCT RAPID STREP A (OFFICE): Rapid Strep A Screen: POSITIVE — AB

## 2023-04-13 MED ORDER — CLINDAMYCIN HCL 300 MG PO CAPS
300.0000 mg | ORAL_CAPSULE | Freq: Three times a day (TID) | ORAL | 0 refills | Status: AC
Start: 2023-04-13 — End: 2023-04-23

## 2023-04-13 NOTE — Assessment & Plan Note (Signed)
Strep tested positive in office.   Ibuprofen/tyelnol prn sore throat/fever Pt told to F/u if no improvement in the next 2-3 days. Rx clindamycin 300 mg tid x 10 days  Pt allergic to pcn

## 2023-04-13 NOTE — Progress Notes (Signed)
Established Patient Office Visit  Subjective:   Patient ID: Nicole Henry, female    DOB: [birth year]  Age: 62 y.o. MRN: [id number]  CC:  Chief Complaint  Patient presents with   Ear Pain    right    HPI: Nicole Henry is a 62 y.o. female presenting on 04/13/2023 for Ear Pain (right)   HPI  A few weeks ago dx with ear infection, went to urgent care, given doxycycline 100 mg bid x 10 days without improvement, then went back one week later because the fluid was really full and she couldn't hear as well so they gave her a round of steroids.   The pain has improved, only feels it slightly, however does feel like it is still popping.  Is not taking anything for allergies at this time.   HLD: was taking crestor but took herself off because of myalgias. She has also had an issue with simvastatin in the past with same s/e.       ROS: Negative unless specifically indicated above in HPI.   Relevant past medical history reviewed and updated as indicated.   Allergies and medications reviewed and updated.   Current Outpatient Medications:    clindamycin (CLEOCIN) 300 MG capsule, Take 1 capsule (300 mg total) by mouth 3 (three) times daily for 10 days., Disp: 30 capsule, Rfl: 0   COVID-19 mRNA vaccine 2023-2024 (COMIRNATY) SUSP injection, Inject into the muscle., Disp: 0.3 mL, Rfl: 0   influenza vac split quadrivalent PF (FLUARIX) 0.5 ML injection, Inject into the muscle., Disp: 0.5 mL, Rfl: 0   sertraline (ZOLOFT) 50 MG tablet, TAKE 1 TABLET BY MOUTH EVERY DAY, Disp: 90 tablet, Rfl: 0   SYNTHROID 50 MCG tablet, Take 2 tablets by mouth every day before breakfast, Disp: 180 tablet, Rfl: 3  Allergies  Allergen Reactions   Ciprofloxacin     Tolerable per pt  Was given in 2019   Crestor [Rosuvastatin]    Nitrofurantoin Other (See Comments)    unknown   Simvastatin Other (See Comments)    myaglias   Penicillins Rash   Sulfonamide Derivatives Rash   Yellow Dyes  (Non-Tartrazine) Rash    Objective:   BP 118/72 (BP Location: Left Arm)   Pulse 82   Temp 97.9 F (36.6 C) (Temporal)   Ht 5\' 3"  (1.6 m)   Wt 135 lb 12.8 oz (61.6 kg)   SpO2 99%   BMI 24.06 kg/m    Physical Exam Constitutional:      General: She is not in acute distress.    Appearance: Normal appearance. She is normal weight. She is not ill-appearing, toxic-appearing or diaphoretic.  HENT:     Head: Normocephalic.     Right Ear: Tympanic membrane normal.     Left Ear: Tympanic membrane normal.     Nose: Nose normal.     Right Turbinates: Enlarged and swollen.     Mouth/Throat:     Mouth: Mucous membranes are dry.     Pharynx: Posterior oropharyngeal erythema present. No oropharyngeal exudate.  Eyes:     Extraocular Movements: Extraocular movements intact.     Pupils: Pupils are equal, round, and reactive to light.  Cardiovascular:     Rate and Rhythm: Normal rate and regular rhythm.     Pulses: Normal pulses.     Heart sounds: Normal heart sounds.  Pulmonary:     Effort: Pulmonary effort is normal.     Breath sounds: Normal breath sounds.  Musculoskeletal:     Cervical back: Normal range of motion.  Neurological:     General: No focal deficit present.     Mental Status: She is alert and oriented to person, place, and time. Mental status is at baseline.  Psychiatric:        Mood and Affect: Mood normal.        Behavior: Behavior normal.        Thought Content: Thought content normal.        Judgment: Judgment normal.     Assessment & Plan:  Myalgia due to statin Assessment & Plan: Pending f/u  Can consider lovastatin or repatha pending lipid panel results   Statin intolerance  Sore throat -     POCT rapid strep A  Strep pharyngitis Assessment & Plan: Strep tested positive in office.   Ibuprofen/tyelnol prn sore throat/fever Pt told to F/u if no improvement in the next 2-3 days. Rx clindamycin 300 mg tid x 10 days  Pt allergic to pcn  Orders: -      Clindamycin HCl; Take 1 capsule (300 mg total) by mouth 3 (three) times daily for 10 days.  Dispense: 30 capsule; Refill: 0  Hypothyroidism, unspecified type Assessment & Plan: Cont f/u with endo as scheduled      Follow up plan: Return for f/u as scheduled for CPE May 2024.  Mort Sawyers, FNP

## 2023-04-13 NOTE — Assessment & Plan Note (Signed)
Cont f/u with endo as scheduled. 

## 2023-04-13 NOTE — Patient Instructions (Addendum)
------------------------------------    You were found to be strep positive,  Take antibiotics that have been sent to the pharmacy.  Change your toothbrush after 24 hours on the antibiotics.  Gargle with warm salt water as needed for sore throat.   ------------------------------------   ------------------------------------ Recommend daily flonase and also zyrtec at night for allergies.   ------------------------------------   Regards,   Mort Sawyers FNP-C

## 2023-04-13 NOTE — Assessment & Plan Note (Signed)
Pending f/u  Can consider lovastatin or repatha pending lipid panel results

## 2023-05-04 ENCOUNTER — Other Ambulatory Visit: Payer: Self-pay | Admitting: Family

## 2023-05-04 DIAGNOSIS — F419 Anxiety disorder, unspecified: Secondary | ICD-10-CM

## 2023-05-06 ENCOUNTER — Encounter: Payer: Federal, State, Local not specified - PPO | Admitting: Family

## 2023-06-01 ENCOUNTER — Encounter: Payer: Self-pay | Admitting: Family

## 2023-06-01 ENCOUNTER — Ambulatory Visit (INDEPENDENT_AMBULATORY_CARE_PROVIDER_SITE_OTHER): Payer: Federal, State, Local not specified - PPO | Admitting: Family

## 2023-06-01 ENCOUNTER — Other Ambulatory Visit: Payer: Self-pay | Admitting: Family

## 2023-06-01 VITALS — BP 110/62 | HR 78 | Temp 97.6°F | Ht 63.0 in | Wt 137.0 lb

## 2023-06-01 DIAGNOSIS — T466X5A Adverse effect of antihyperlipidemic and antiarteriosclerotic drugs, initial encounter: Secondary | ICD-10-CM

## 2023-06-01 DIAGNOSIS — Z0001 Encounter for general adult medical examination with abnormal findings: Secondary | ICD-10-CM

## 2023-06-01 DIAGNOSIS — M8580 Other specified disorders of bone density and structure, unspecified site: Secondary | ICD-10-CM | POA: Diagnosis not present

## 2023-06-01 DIAGNOSIS — N6312 Unspecified lump in the right breast, upper inner quadrant: Secondary | ICD-10-CM | POA: Diagnosis not present

## 2023-06-01 DIAGNOSIS — E785 Hyperlipidemia, unspecified: Secondary | ICD-10-CM

## 2023-06-01 DIAGNOSIS — E559 Vitamin D deficiency, unspecified: Secondary | ICD-10-CM | POA: Diagnosis not present

## 2023-06-01 DIAGNOSIS — E039 Hypothyroidism, unspecified: Secondary | ICD-10-CM | POA: Diagnosis not present

## 2023-06-01 DIAGNOSIS — Z1211 Encounter for screening for malignant neoplasm of colon: Secondary | ICD-10-CM

## 2023-06-01 DIAGNOSIS — F411 Generalized anxiety disorder: Secondary | ICD-10-CM

## 2023-06-01 DIAGNOSIS — M791 Myalgia, unspecified site: Secondary | ICD-10-CM

## 2023-06-01 DIAGNOSIS — M816 Localized osteoporosis [Lequesne]: Secondary | ICD-10-CM

## 2023-06-01 DIAGNOSIS — Z23 Encounter for immunization: Secondary | ICD-10-CM | POA: Diagnosis not present

## 2023-06-01 DIAGNOSIS — Z789 Other specified health status: Secondary | ICD-10-CM

## 2023-06-01 HISTORY — DX: Localized osteoporosis (Lequesne): M81.6

## 2023-06-01 HISTORY — DX: Encounter for general adult medical examination with abnormal findings: Z00.01

## 2023-06-01 LAB — COMPREHENSIVE METABOLIC PANEL
ALT: 11 U/L (ref 0–35)
AST: 17 U/L (ref 0–37)
Albumin: 4.3 g/dL (ref 3.5–5.2)
Alkaline Phosphatase: 76 U/L (ref 39–117)
BUN: 12 mg/dL (ref 6–23)
CO2: 31 mEq/L (ref 19–32)
Calcium: 9.2 mg/dL (ref 8.4–10.5)
Chloride: 104 mEq/L (ref 96–112)
Creatinine, Ser: 0.8 mg/dL (ref 0.40–1.20)
GFR: 79.44 mL/min (ref >60.00)
Glucose, Bld: 96 mg/dL (ref 70–99)
Potassium: 4.6 mEq/L (ref 3.5–5.1)
Sodium: 140 mEq/L (ref 135–145)
Total Bilirubin: 0.5 mg/dL (ref 0.2–1.2)
Total Protein: 7.3 g/dL (ref 6.0–8.3)

## 2023-06-01 LAB — CBC
HCT: 38.9 % (ref 36.0–46.0)
Hemoglobin: 12.6 g/dL (ref 12.0–15.0)
MCHC: 32.3 g/dL (ref 30.0–36.0)
MCV: 88 fl (ref 78.0–100.0)
Platelets: 337 10*3/uL (ref 150.0–400.0)
RBC: 4.42 Mil/uL (ref 3.87–5.11)
RDW: 14 % (ref 11.5–15.5)
WBC: 4.1 10*3/uL (ref 4.0–10.5)

## 2023-06-01 LAB — LIPID PANEL
Cholesterol: 281 mg/dL — ABNORMAL HIGH (ref 0–200)
HDL: 64.9 mg/dL (ref >39.00)
LDL Cholesterol: 198 mg/dL — ABNORMAL HIGH (ref 0–99)
NonHDL: 216.26
Total CHOL/HDL Ratio: 4
Triglycerides: 90 mg/dL (ref 0.0–149.0)
VLDL: 18 mg/dL (ref 0.0–40.0)

## 2023-06-01 LAB — VITAMIN D 25 HYDROXY (VIT D DEFICIENCY, FRACTURES): VITD: 30.09 ng/mL (ref 30.00–100.00)

## 2023-06-01 LAB — T4, FREE: Free T4: 1.03 ng/dL (ref 0.60–1.60)

## 2023-06-01 LAB — TSH: TSH: 5.93 u[IU]/mL — ABNORMAL HIGH (ref 0.35–5.50)

## 2023-06-01 MED ORDER — ALENDRONATE SODIUM 70 MG PO TABS
70.0000 mg | ORAL_TABLET | ORAL | 3 refills | Status: AC
Start: 2023-06-01 — End: ?

## 2023-06-01 MED ORDER — LOVASTATIN 20 MG PO TABS
20.0000 mg | ORAL_TABLET | Freq: Every day | ORAL | 3 refills | Status: DC
Start: 2023-06-01 — End: 2023-12-14

## 2023-06-01 NOTE — Assessment & Plan Note (Signed)
Patient Counseling(The following topics were reviewed): ? Preventative care handout given to pt  ?Health maintenance and immunizations reviewed. Please refer to Health maintenance section. ?Pt advised on safe sex, wearing seatbelts in car, and proper nutrition ?labwork ordered today for annual ?Dental health: Discussed importance of regular tooth brushing, flossing, and dental visits. ? ? ?

## 2023-06-01 NOTE — Assessment & Plan Note (Signed)
Start fosamax, pt aggreable.  Reviewed bone density.  Advised to start calcium and vitamin D. Work on [employer] bearing exercises as tolerated.

## 2023-06-01 NOTE — Patient Instructions (Addendum)
  A referral was placed today for colonoscopy.  Please let us know if you have not heard back within 2 weeks about the referral.   ------------------------------------  I have sent an electronic order over to your preferred location for the following:   bilateral breast diagnostic mammogram and with right breast ultrasound   Please give this center a call to get scheduled at your convenience.   [x]   [facility]      [address] [place], [place]        [phone]         Make sure to wear two piece  clothing  No lotions powders or deodorants the day of the appointment Make sure to bring picture ID and insurance card.  Bring list of medications you are currently taking including any supplements.   ------------------------------------  Start fosamax for your osteoporosis I do recommend daily calcium and vitamin D for this as well as weight bearing exercises.    ------------------------------------

## 2023-06-01 NOTE — Progress Notes (Signed)
Pt requested we draw thyroid levels in office, states she has a f/u coming up with you.

## 2023-06-01 NOTE — Assessment & Plan Note (Signed)
Continue sertraline 50 mg daily Stable Work on anxiety reducing techniques 

## 2023-06-01 NOTE — Progress Notes (Signed)
Subjective:  Patient ID: Nicole Henry, female    DOB: Aug 11, 1961  Age: 62 y.o. MRN: [id number]  Patient Care Team: Mort Sawyers, FNP as PCP - General (Family Medicine) Huel Cote, MD as Consulting Physician (Obstetrics and Gynecology) Carlus Pavlov, MD as Consulting Physician (Internal Medicine)   CC:  Chief Complaint  Patient presents with   Annual Exam    Fasting     HPI Nicole Henry is a 62 y.o. female who presents today for an annual physical exam. She reports consuming a general low cholesterol diet doesn't eat much meat.  Home exercise routine includes when she is able, currently taking care of her parents so exercising much less. She generally feels well. She reports sleeping well. She does not have additional problems to discuss today.   Vision:Within last year Dental:Receives regular dental care STD:The patient denies history of sexually transmitted disease.   Mammogram: 11/18/2021, GI breast Last pap: 10/03/20 Colonoscopy:overdue last in 2013 will order  Bone density scan:12/29/22 Shingles vaccine: will get first vaccine today   Pt is without acute concerns.   Lab Results  Component Value Date   CHOL 177 03/04/2022   HDL 54.10 03/04/2022   LDLCALC 84 03/04/2022   LDLDIRECT 151.4 05/12/2013   TRIG 194.0 (H) 03/04/2022   CHOLHDL 3 03/04/2022    Advanced Directives Patient does not have advanced directives.  DEPRESSION SCREENING    04/13/2023    8:46 AM 02/17/2022    1:37 PM 11/24/2021    4:15 PM 07/26/2020    2:33 PM 05/15/2020    2:01 PM 03/30/2019   10:43 AM 09/15/2017    8:09 AM  PHQ 2/9 Scores  PHQ - 2 Score 0 0 0 0 0 0 0  PHQ- 9 Score   0 1 0       ROS: Negative unless specifically indicated above in HPI.    Current Outpatient Medications:    alendronate (FOSAMAX) 70 MG tablet, Take 1 tablet (70 mg total) by mouth every 7 (seven) days. Take with a full glass of water on an empty stomach. Remain upright for thirty minutes after  taking., Disp: 12 tablet, Rfl: 3   cholecalciferol (VITAMIN D3) 25 MCG (1000 UNIT) tablet, Take 1,000 Units by mouth daily., Disp: , Rfl:    sertraline (ZOLOFT) 50 MG tablet, TAKE 1 TABLET BY MOUTH EVERY DAY, Disp: 90 tablet, Rfl: 3   SYNTHROID 50 MCG tablet, Take 2 tablets by mouth every day before breakfast, Disp: 180 tablet, Rfl: 3   tretinoin (RETIN-A) 0.05 % cream, Apply topically at bedtime., Disp: , Rfl:     Objective:    BP 110/62   Pulse 78   Temp 97.6 F (36.4 C) (Temporal)   Ht 5\' 3"  (1.6 m)   Wt 137 lb (62.1 kg)   SpO2 98%   BMI 24.27 kg/m   BP Readings from Last 3 Encounters:  06/01/23 110/62  04/13/23 118/72  03/26/23 132/80      Physical Exam Constitutional:      General: She is not in acute distress.    Appearance: Normal appearance. She is normal weight. She is not ill-appearing.  HENT:     Head: Normocephalic.     Right Ear: Tympanic membrane normal.     Left Ear: Tympanic membrane normal.     Nose: Nose normal.     Mouth/Throat:     Mouth: Mucous membranes are moist.  Eyes:     Extraocular Movements: Extraocular movements intact.  Pupils: Pupils are equal, round, and reactive to light.  Cardiovascular:     Rate and Rhythm: Normal rate and regular rhythm.  Pulmonary:     Effort: Pulmonary effort is normal.     Breath sounds: Normal breath sounds.  Abdominal:     General: Abdomen is flat. Bowel sounds are normal.     Palpations: Abdomen is soft.     Tenderness: There is no guarding or rebound.  Musculoskeletal:        General: Normal range of motion.     Cervical back: Normal range of motion.  Skin:    General: Skin is warm.     Capillary Refill: Capillary refill takes less than 2 seconds.  Neurological:     General: No focal deficit present.     Mental Status: She is alert.  Psychiatric:        Mood and Affect: Mood normal.        Behavior: Behavior normal.        Thought Content: Thought content normal.        Judgment: Judgment  normal.          Assessment & Plan:  Hypothyroidism, unspecified type Assessment & Plan: Continue f/u with endo as scheduled Continue synthroid 50 mcg once daily  Pt is requesting labs today prior to endo appt, advised pt typically endo will order however since getting lab work in office today pt requesting we draw. Will order and send to endo for review.   Orders: -     TSH -     T4, free  Osteopenia, unspecified location Assessment & Plan: Recommend daily vitamin D and calcium  Work on weight bearing exercises  bone dexa for every two years.     Hyperlipidemia, unspecified hyperlipidemia type Assessment & Plan: Ordered lipid panel, pending results. Work on low cholesterol diet and exercise as tolerated   Orders: -     Comprehensive metabolic panel -     Lipid panel  GAD (generalized anxiety disorder) Assessment & Plan: Continue sertraline 50 mg daily Stable Work on anxiety reducing techniques   Localized osteoporosis without current pathological fracture Assessment & Plan: Start fosamax, pt aggreable.  Reviewed bone density.  Advised to start calcium and vitamin D. Work on [employer] bearing exercises as tolerated.  Orders: -     Alendronate Sodium; Take 1 tablet (70 mg total) by mouth every 7 (seven) days. Take with a full glass of water on an empty stomach. Remain upright for thirty minutes after taking.  Dispense: 12 tablet; Refill: 3  Mass of upper inner quadrant of right breast -     MM 3D DIAGNOSTIC MAMMOGRAM BILATERAL BREAST; Future -     [place] LIMITED ULTRASOUND INCLUDING AXILLA RIGHT BREAST; Future  Screening for colon cancer -     Ambulatory referral to Gastroenterology  Encounter for general adult medical examination with abnormal findings Assessment & Plan: Patient Counseling(The following topics were reviewed):  Preventative care handout given to pt  Health maintenance and immunizations reviewed. Please refer to Health maintenance section. Pt  advised on safe sex, wearing seatbelts in car, and proper nutrition labwork ordered today for annual Dental health: Discussed importance of regular tooth brushing, flossing, and dental visits.   Orders: -     VITAMIN D 25 Hydroxy (Vit-D Deficiency, Fractures) -     CBC -     Comprehensive metabolic panel -     Lipid panel -     TSH -  T4, free  Myalgia due to statin Assessment & Plan: Pending lipid panel  If lipid panel elevated will consider lovastatin followed by repatha consideration if myalgia intolerance   Vitamin D deficiency -     VITAMIN D 25 Hydroxy (Vit-D Deficiency, Fractures)      Follow-up: Return in about 6 months (around 12/01/2023) for f/u cholesterol.   Mort Sawyers, FNP

## 2023-06-01 NOTE — Assessment & Plan Note (Signed)
Recommend daily vitamin D and calcium  Work on weight bearing exercises  bone dexa for every two years.   

## 2023-06-01 NOTE — Assessment & Plan Note (Addendum)
Continue f/u with endo as scheduled Continue synthroid 50 mcg once daily  Pt is requesting labs today prior to endo appt, advised pt typically endo will order however since getting lab work in office today pt requesting we draw. Will order and send to endo for review.

## 2023-06-01 NOTE — Assessment & Plan Note (Signed)
Ordered lipid panel, pending results. Work on low cholesterol diet and exercise as tolerated  

## 2023-06-01 NOTE — Assessment & Plan Note (Signed)
Pending lipid panel  If lipid panel elevated will consider lovastatin followed by repatha consideration if myalgia intolerance

## 2023-06-08 NOTE — Addendum Note (Signed)
Addended by: Donnamarie Poag on: 06/08/2023 12:37 PM   Modules accepted: Orders

## 2023-06-22 ENCOUNTER — Encounter: Payer: Self-pay | Admitting: *Deleted

## 2023-06-30 ENCOUNTER — Encounter: Payer: Self-pay | Admitting: Family

## 2023-07-05 ENCOUNTER — Ambulatory Visit: Payer: Federal, State, Local not specified - PPO | Admitting: Internal Medicine

## 2023-07-05 ENCOUNTER — Encounter: Payer: Self-pay | Admitting: Internal Medicine

## 2023-07-05 VITALS — BP 120/70 | HR 74 | Ht 63.0 in | Wt 137.6 lb

## 2023-07-05 DIAGNOSIS — R7303 Prediabetes: Secondary | ICD-10-CM | POA: Diagnosis not present

## 2023-07-05 DIAGNOSIS — E559 Vitamin D deficiency, unspecified: Secondary | ICD-10-CM

## 2023-07-05 DIAGNOSIS — E038 Other specified hypothyroidism: Secondary | ICD-10-CM | POA: Diagnosis not present

## 2023-07-05 DIAGNOSIS — E063 Autoimmune thyroiditis: Secondary | ICD-10-CM | POA: Diagnosis not present

## 2023-07-05 DIAGNOSIS — M810 Age-related osteoporosis without current pathological fracture: Secondary | ICD-10-CM | POA: Diagnosis not present

## 2023-07-05 LAB — TSH: TSH: 0.22 u[IU]/mL — ABNORMAL LOW (ref 0.35–5.50)

## 2023-07-05 LAB — HEMOGLOBIN A1C: Hgb A1c MFr Bld: 5.9 % (ref 4.6–6.5)

## 2023-07-05 LAB — T4, FREE: Free T4: 1.32 ng/dL (ref 0.60–1.60)

## 2023-07-05 MED ORDER — SYNTHROID 50 MCG PO TABS: 75.0000 ug | ORAL_TABLET | Freq: Every day | ORAL | 3 refills | Status: DC

## 2023-07-05 NOTE — Progress Notes (Signed)
Patient ID: Nicole Henry, female   DOB: 08/07/1961, 62 y.o.   MRN: [id number]   HPI  Nicole Henry is a 62 y.o.-year-old female, returning for follow-up for uncontrolled Hashimoto's hypothyroidism, vitamin D deficiency, osteoporosis, prediabetes. Last visit 1 year ago.  Interim history: She previously had hair loss, constipation, and fatigue, but these resolved. No falls or fractures since last visit.  She had a bone density scan 6 months ago after which she was started on Fosamax by PCP.  No jaw/hip/thigh pain.  Reviewed history: Pt. has been dx with hypothyroidism in 2009 (fatigue, TSH 16) >> started on Synthroid d.a.w.  She cannot take levothyroxine with dyes, therefore, she is on white, 50 mcg, tablets.  She is currently on Synthroid d.a.w. 100 alternating with 75 mcg every other daily (2 alt. With 1.5 tablet of the 50 mcg dose): - in am - fasting - at least 30 min from b'fast - no Ca, Fe - + MVI, PPIs -later in the day-stopped - not on Biotin  TFTs reviewed: Lab Results  Component Value Date   TSH 5.93 (H) 06/01/2023   TSH 1.72 10/07/2022   TSH 0.02 (L) 07/02/2022   TSH 0.62 01/07/2022   TSH 4.49 07/01/2021   TSH 2.50 07/01/2020   TSH 0.12 (L) 09/28/2019   TSH 11.15 (H) 07/03/2019   TSH 0.49 12/29/2018   TSH 0.48 11/21/2018   FREET4 1.03 06/01/2023   FREET4 1.00 10/07/2022   FREET4 0.94 07/02/2022   FREET4 0.96 07/01/2021   FREET4 0.60 07/01/2020   FREET4 1.26 09/28/2019   FREET4 0.70 07/03/2019   FREET4 1.00 12/29/2018   FREET4 0.80 11/21/2018   FREET4 1.10 10/03/2018   T3FREE 2.8 03/08/2018   T3FREE 2.7 07/25/2014   She had elevated antithyroid antibodies: Lab Results  Component Value Date   THGAB 9 (H) 11/21/2018   THGAB 9 (H) 06/27/2018   Component     Latest Ref Rng & Units 06/27/2018 11/21/2018  Thyroperoxidase Ab SerPl-aCnc     0 - 34 IU/mL 127 (H) 61 (H)   No FH of thyroid cancer. No h/o radiation tx to head or neck. No Biotin use. No recent  steroids use.   Vitamin D deficiency:  Reviewed vitamin D levels: Lab Results  Component Value Date   VD25OH 30.09 06/01/2023   VD25OH 25.35 (L) 03/04/2022   VD25OH 30.84 01/07/2022   VD25OH 34.3 07/01/2021   VD25OH 32.2 07/01/2020   VD25OH 36.53 07/03/2019   VD25OH 26.82 (L) 11/21/2018   VD25OH 30.35 10/03/2018   VD25OH 23.38 (L) 06/27/2018   Previously on vitamin D3 5000 units daily + multivitamin, but she came off.  At last visit I advised her to restart vitamin D at 2000 units daily.  She is on this dose now.  Osteoporosis:  Reviewed previous bone density reports: DXA (12/29/2022) - [facility] L1-L4:  -1.5 (-1.1%) RFN: -2.5 LFN: -2.3  DXA (09/01/2018) - [facility]: RFN 09/01/2018        -2.4   RFN 06/29/2016        -2.5  LFN 09/01/2018       -2.3 LFN 06/29/2016       -2.2  AP Spine  L1-L4      09/01/2018    56.8         -1.4    1.015 g/cm2 (+1%) AP Spine  L1-L4      06/29/2016    54.6         -1.5  1.005 g/cm2  DualFemur Total Mean 09/01/2018    56.8         -1.3    0.838 g/cm2 DualFemur Total Mean 06/29/2016    54.6         -1.5    0.824 g/cm2  FRAX: Major osteoporosis fracture risk over 10 years: 9.5% Hip fracture risk over 10 years: 1.6%  Stable T-scores, in the osteopenic range.  She also has a history of hyperlipidemia.  She is on Lovastatin - stated since last OV.  She also has a history of high HbA1c -prediabetes: Lab Results  Component Value Date   HGBA1C 6.1 03/04/2022   HGBA1C 5.9 01/07/2022   HGBA1C 6.0 07/01/2021   She was diagnosed with interstitial cystitis in 2023.  She tried a regular estradiol tablet and had a reaction to the dyes.  ROS: + see HPI  I reviewed pt's medications, allergies, PMH, social hx, family hx, and changes were documented in the history of present illness. Otherwise, unchanged from my initial visit note.  Past Medical History:  Diagnosis Date   Allergic rhinitis due to pollen    Allergy    Anxiety    Basal  cell carcinoma    history of   BASAL CELL CARCINOMA, HX OF 11/22/2008   Qualifier: Diagnosis of  By: Ermalene Searing MD, Amy     Encounter for general adult medical examination with abnormal findings 06/01/2023   Family history of diabetes mellitus    GERD (gastroesophageal reflux disease)    Hypertension    Hypothyroidism    Localized osteoporosis without current pathological fracture 06/01/2023   Pure hypercholesterolemia    Rosacea    Screening for lipoid disorders    Past Surgical History:  Procedure Laterality Date   AUGMENTATION MAMMAPLASTY Bilateral    1999   BREAST ENHANCEMENT SURGERY  1997   Social History   Socioeconomic History   Marital status: Married    Spouse name: Not on file   Number of children: 2   Years of education: Not on file   Highest education level: Not on file  Occupational History    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   Diet- fruits and veggies.   Social Determinants of Health   Financial Resource Strain: Not on file  Food Insecurity: Not on file  Transportation Needs: Not on file  Physical Activity: Not on file  Stress: Not on file  Social Connections: Not on file  Intimate Partner Violence: Not on file   Current Outpatient Medications on File Prior to Visit  Medication Sig Dispense Refill   alendronate (FOSAMAX) 70 MG tablet Take 1 tablet (70 mg total) by mouth every 7 (seven) days. Take with a full glass of water on an empty stomach. Remain upright for thirty minutes after taking. 12 tablet 3   cholecalciferol (VITAMIN D3) 25  MCG (1000 UNIT) tablet Take 1,000 Units by mouth daily.     lovastatin (MEVACOR) 20 MG tablet Take 1 tablet (20 mg  total) by mouth at bedtime. 90 tablet 3   sertraline (ZOLOFT) 50 MG tablet TAKE 1 TABLET BY MOUTH EVERY DAY 90 tablet 3   SYNTHROID 50 MCG tablet Take 2 tablets by mouth every day before breakfast 180 tablet 3   tretinoin (RETIN-A) 0.05 % cream Apply topically at bedtime.     No current facility-administered medications on file prior to visit.   Allergies  Allergen Reactions   Estradiol Rash   Ciprofloxacin     Tolerable per pt  Was given in 2019   Crestor [Rosuvastatin]    Nitrofurantoin Other (See Comments)    unknown   Pravastatin Other (See Comments)    myalgias   Simvastatin Other (See Comments)    myaglias   Penicillins Rash   Sulfonamide Derivatives Rash   Yellow Dyes (Non-Tartrazine) Rash   Family History  Problem Relation Age of Onset   Hypertension Mother    Hyperlipidemia Mother    Hyperlipidemia Father    Hypertension Father    Prostate cancer Father    Heart disease Brother        ? heart issue   Heart attack Paternal Grandfather        late age   Bladder Cancer Neg Hx    Kidney cancer Neg Hx     PE: BP 120/70   Pulse 74   Ht 5\' 3"  (1.6 m)   Wt 137 lb 9.6 oz (62.4 kg)   SpO2 99%   BMI 24.37 kg/m  Wt Readings from Last 3 Encounters:  07/05/23 137 lb 9.6 oz (62.4 kg)  06/01/23 137 lb (62.1 kg)  04/13/23 135 lb 12.8 oz (61.6 kg)   Constitutional: normal weight, in NAD Eyes: EOMI, no exophthalmos ENT: no thyromegaly, no cervical lymphadenopathy Cardiovascular: RRR, No MRG Respiratory: CTA B Musculoskeletal: no deformities Skin:  no rashes Neurological: no tremor with outstretched hands  ASSESSMENT: 1. Hypothyroidism due to Hashimoto's thyroiditis  2. Osteoporosis   3. Vit D def  4. Prediabetes  PLAN:  1.  Patient with longstanding hypothyroidism, on 3 brand-name 50 mcg tablets due to the lack of dyes - latest thyroid labs  reviewed with pt. >> TSH was slightly elevated Lab Results  Component Value Date   TSH 5.93 (H) 06/01/2023  - she continues on LT4 100 mcg 5/7 days and  50 mcg 2/7 days (equivalent dose: 85 mcg daily) - pt still feels some fatigue and hair loss, and also some weight gain - we discussed about taking the thyroid hormone every day, with water, >30 minutes before breakfast, separated by >4 hours from acid reflux medications, calcium, iron, multivitamins. Pt. is taking it correctly. - will check thyroid tests today: TSH and fT4 - If labs are abnormal, she will need to return for repeat TFTs in 1.5 months  2. Osteoporosis  -Reviewed previous bone density scan reports.  At her check from 2019, T-scores were improved.  She had another bone density scan 12/29/2022 and this showed stability of her T-scores.  The left femoral neck T-score was in the osteoporosis range.  PCP recommended Fosamax on 06/01/2023.  She continues on this.  I advised her to take this approximately 30 minutes before levothyroxine. 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 However, she is doing some weightbearing exercises. -I also recommended optimizing exercise and vitamin D/calcium intake.  She had a normal calcium and  GFR 06/01/2023.  3.  Vitamin D deficiency -At last visit she was off her multivitamin +5000 units vitamin D daily supplement.  She stopped these due to her reaction to dyes in her estradiol formulation. -As her vitamin D level was slightly low, I advised her to restart vitamin D3 at 2000 units daily -She had another level obtained 06/01/2023 and this was normal, 30.09  4.  Prediabetes -HbA1c was higher last year, at 6.1% -We discussed at that time about the reversibility of prediabetes with improvement in her diet, which she already started to do, and consistent exercise -Will recheck  another HbA1c today  Needs refills.  Component     Latest Ref Rng 07/05/2023  T4,Free(Direct)     0.60 - 1.60 ng/dL 0.98   TSH     1.19 - 1.47 uIU/mL 0.22 (L)   Hemoglobin A1C     4.6 - 6.5 % 5.9     Hba1c is lower. TSH is now suppressed so I will advise her to either take 75 mcg daily or alternate 100 with 50 mcg every other day. Plan to repeat her TFTs in 1.5 months.   Carlus Pavlov, MD PhD [facility]

## 2023-07-05 NOTE — Patient Instructions (Addendum)
Please stop at the lab.  Please continue Synthroid 100 mcg 5/7 days and 50 mcg 2/7 days.  Take the thyroid hormone every day, with water, at least 30 minutes before breakfast, separated by at least 4 hours from: - acid reflux medications - calcium - iron - multivitamins  Please come back for a follow-up appointment in 1 year.

## 2023-08-04 ENCOUNTER — Other Ambulatory Visit: Payer: Self-pay | Admitting: Family

## 2023-08-04 ENCOUNTER — Ambulatory Visit
Admission: RE | Admit: 2023-08-04 | Discharge: 2023-08-04 | Disposition: A | Discharge status: Home / Self Care | Payer: Federal, State, Local not specified - PPO | Source: Ambulatory Visit | Attending: Family | Admitting: Family

## 2023-08-04 DIAGNOSIS — E785 Hyperlipidemia, unspecified: Secondary | ICD-10-CM

## 2023-08-04 DIAGNOSIS — Z0001 Encounter for general adult medical examination with abnormal findings: Secondary | ICD-10-CM

## 2023-08-04 DIAGNOSIS — F411 Generalized anxiety disorder: Secondary | ICD-10-CM

## 2023-08-04 DIAGNOSIS — M8580 Other specified disorders of bone density and structure, unspecified site: Secondary | ICD-10-CM

## 2023-08-04 DIAGNOSIS — E039 Hypothyroidism, unspecified: Secondary | ICD-10-CM

## 2023-08-04 DIAGNOSIS — N6312 Unspecified lump in the right breast, upper inner quadrant: Secondary | ICD-10-CM

## 2023-08-04 DIAGNOSIS — M791 Myalgia, unspecified site: Secondary | ICD-10-CM

## 2023-08-04 DIAGNOSIS — M816 Localized osteoporosis [Lequesne]: Secondary | ICD-10-CM

## 2023-08-04 DIAGNOSIS — E559 Vitamin D deficiency, unspecified: Secondary | ICD-10-CM

## 2023-08-04 DIAGNOSIS — Z1211 Encounter for screening for malignant neoplasm of colon: Secondary | ICD-10-CM

## 2023-08-04 DIAGNOSIS — N6341 Unspecified lump in right breast, subareolar: Secondary | ICD-10-CM | POA: Diagnosis not present

## 2023-08-04 NOTE — Progress Notes (Signed)
noted 

## 2023-08-11 DIAGNOSIS — M79645 Pain in left finger(s): Secondary | ICD-10-CM | POA: Diagnosis not present

## 2023-08-11 DIAGNOSIS — M19042 Primary osteoarthritis, left hand: Secondary | ICD-10-CM | POA: Diagnosis not present

## 2023-08-31 ENCOUNTER — Encounter: Payer: Self-pay | Admitting: *Deleted

## 2023-09-01 ENCOUNTER — Ambulatory Visit: Payer: Federal, State, Local not specified - PPO | Admitting: Family

## 2023-09-01 DIAGNOSIS — D692 Other nonthrombocytopenic purpura: Secondary | ICD-10-CM | POA: Diagnosis not present

## 2023-09-01 DIAGNOSIS — L820 Inflamed seborrheic keratosis: Secondary | ICD-10-CM | POA: Diagnosis not present

## 2023-09-01 DIAGNOSIS — Z85828 Personal history of other malignant neoplasm of skin: Secondary | ICD-10-CM | POA: Diagnosis not present

## 2023-09-01 DIAGNOSIS — L570 Actinic keratosis: Secondary | ICD-10-CM | POA: Diagnosis not present

## 2023-09-01 DIAGNOSIS — D2262 Melanocytic nevi of left upper limb, including shoulder: Secondary | ICD-10-CM | POA: Diagnosis not present

## 2023-09-01 DIAGNOSIS — L814 Other melanin hyperpigmentation: Secondary | ICD-10-CM | POA: Diagnosis not present

## 2023-09-03 ENCOUNTER — Ambulatory Visit: Payer: Federal, State, Local not specified - PPO | Admitting: Family

## 2023-09-03 ENCOUNTER — Encounter: Payer: Self-pay | Admitting: Family

## 2023-09-03 VITALS — BP 122/82 | HR 76 | Temp 97.7°F | Ht 63.0 in | Wt 140.8 lb

## 2023-09-03 DIAGNOSIS — J40 Bronchitis, not specified as acute or chronic: Secondary | ICD-10-CM | POA: Insufficient documentation

## 2023-09-03 DIAGNOSIS — J029 Acute pharyngitis, unspecified: Secondary | ICD-10-CM | POA: Diagnosis not present

## 2023-09-03 MED ORDER — PREDNISONE 10 MG (21) PO TBPK
ORAL_TABLET | ORAL | 0 refills | Status: DC
Start: 2023-09-03 — End: 2023-12-13

## 2023-09-03 NOTE — Assessment & Plan Note (Addendum)
Redness of throat, strep tested in office, negative. Rx prednisone pack  Could consider allergy medication over the counter If no improvement pt to let me know.

## 2023-09-03 NOTE — Progress Notes (Signed)
Established Patient Office Visit  Subjective:      CC:  Chief Complaint  Patient presents with   Persistent Cough    States 4 weeks ago she came down with "something." Tested for COVID but was negative. Still having issues coughing and slight fatigue. Cough is non productive. Feels like when she is in the shower the cough "breaks up." Denies fever, chills, sinus issues.    HPI: Nicole Henry is a 62 y.o. female presenting on 09/03/2023 for Persistent Cough (States 4 weeks ago she came down with "something." Tested for COVID but was negative. Still having issues coughing and slight fatigue. Cough is non productive. Feels like when she is in the shower the cough "breaks up." Denies fever, chills, sinus issues.) . Four weeks ago was pretty sick, and she states was very fatigued, she was covid negative. She states she feels overall better but she has a residual cough. She says the cough is non productive but during the shower she will increase her coughing. Once the cough starts she coughs over and over. Mild chest congestion. During the coughing fits she feels her cough burns during that time. No nasal congestion, sore throat, ear pain and or fever.         Social history:  Relevant past medical, surgical, family and social history reviewed and updated as indicated. Interim medical history since our last visit reviewed.  Allergies and medications reviewed and updated.  DATA REVIEWED: CHART IN EPIC     ROS: Negative unless specifically indicated above in HPI.    Current Outpatient Medications:    alendronate (FOSAMAX) 70 MG tablet, Take 1 tablet (70 mg total) by mouth every 7 (seven) days. Take with a full glass of water on an empty stomach. Remain upright for thirty minutes after taking., Disp: 12 tablet, Rfl: 3   cholecalciferol (VITAMIN D3) 25 MCG (1000 UNIT) tablet, Take 1,000 Units by mouth daily., Disp: , Rfl:    lovastatin (MEVACOR) 20 MG tablet, Take 1 tablet (20 mg  total) by mouth at bedtime., Disp: 90 tablet, Rfl: 3   predniSONE (STERAPRED UNI-PAK 21 TAB) 10 MG (21) TBPK tablet, Take as directed, Disp: 1 each, Rfl: 0   sertraline (ZOLOFT) 50 MG tablet, TAKE 1 TABLET BY MOUTH EVERY DAY, Disp: 90 tablet, Rfl: 3   SYNTHROID 50 MCG tablet, Take 1.5 tablets (75 mcg total) by mouth daily before breakfast., Disp: 75 tablet, Rfl: 3   tretinoin (RETIN-A) 0.05 % cream, Apply topically at bedtime., Disp: , Rfl:       Objective:    BP 122/82 (BP Location: Left Arm, Patient Position: Sitting, Cuff Size: Normal)   Pulse 76   Temp 97.7 F (36.5 C) (Temporal)   Ht 5\' 3"  (1.6 m)   Wt 140 lb 12.8 oz (63.9 kg)   SpO2 97%   BMI 24.94 kg/m   Wt Readings from Last 3 Encounters:  09/03/23 140 lb 12.8 oz (63.9 kg)  07/05/23 137 lb 9.6 oz (62.4 kg)  06/01/23 137 lb (62.1 kg)    Physical Exam Constitutional:      General: She is not in acute distress.    Appearance: Normal appearance. She is normal weight. She is not ill-appearing, toxic-appearing or diaphoretic.  HENT:     Head: Normocephalic.     Right Ear: Tympanic membrane normal.     Left Ear: Tympanic membrane normal.     Nose: Nose normal.     Mouth/Throat:  Mouth: Mucous membranes are dry.     Pharynx: Posterior oropharyngeal erythema and postnasal drip present. No oropharyngeal exudate.  Eyes:     Extraocular Movements: Extraocular movements intact.     Pupils: Pupils are equal, round, and reactive to light.  Cardiovascular:     Rate and Rhythm: Normal rate and regular rhythm.     Pulses: Normal pulses.     Heart sounds: Normal heart sounds.  Pulmonary:     Effort: Pulmonary effort is normal.     Breath sounds: Normal breath sounds.  Musculoskeletal:     Cervical back: Normal range of motion.  Neurological:     General: No focal deficit present.     Mental Status: She is alert and oriented to person, place, and time. Mental status is at baseline.  Psychiatric:        Mood and Affect:  Mood normal.        Behavior: Behavior normal.        Thought Content: Thought content normal.        Judgment: Judgment normal.           Assessment & Plan:  Bronchitis Assessment & Plan: Redness of throat, strep tested in office, negative. Rx prednisone pack  Could consider allergy medication over the counter If no improvement pt to let me know.   Orders: -     predniSONE; Take as directed  Dispense: 1 each; Refill: 0  Sore throat -     POCT rapid strep A     Return if symptoms worsen or fail to improve.  Mort Sawyers, MSN, APRN, FNP-C [facility]

## 2023-09-14 ENCOUNTER — Encounter: Payer: Self-pay | Admitting: Family

## 2023-09-14 DIAGNOSIS — J0110 Acute frontal sinusitis, unspecified: Secondary | ICD-10-CM

## 2023-09-14 MED ORDER — DOXYCYCLINE HYCLATE 100 MG PO TABS
100.0000 mg | ORAL_TABLET | Freq: Two times a day (BID) | ORAL | 0 refills | Status: AC
Start: 2023-09-14 — End: 2023-09-21

## 2023-12-01 ENCOUNTER — Ambulatory Visit: Payer: Federal, State, Local not specified - PPO | Admitting: Family

## 2023-12-13 ENCOUNTER — Encounter: Payer: Self-pay | Admitting: Family

## 2023-12-13 ENCOUNTER — Ambulatory Visit: Payer: Federal, State, Local not specified - PPO | Admitting: Family

## 2023-12-13 VITALS — BP 112/72 | HR 83 | Temp 98.2°F | Ht 63.0 in | Wt 140.8 lb

## 2023-12-13 DIAGNOSIS — E559 Vitamin D deficiency, unspecified: Secondary | ICD-10-CM | POA: Diagnosis not present

## 2023-12-13 DIAGNOSIS — R7303 Prediabetes: Secondary | ICD-10-CM

## 2023-12-13 DIAGNOSIS — Z23 Encounter for immunization: Secondary | ICD-10-CM

## 2023-12-13 DIAGNOSIS — F411 Generalized anxiety disorder: Secondary | ICD-10-CM | POA: Diagnosis not present

## 2023-12-13 DIAGNOSIS — E039 Hypothyroidism, unspecified: Secondary | ICD-10-CM | POA: Diagnosis not present

## 2023-12-13 DIAGNOSIS — E782 Mixed hyperlipidemia: Secondary | ICD-10-CM | POA: Diagnosis not present

## 2023-12-13 LAB — LIPID PANEL
Cholesterol: 229 mg/dL — ABNORMAL HIGH (ref 0–200)
HDL: 70.2 mg/dL (ref >39.00)
LDL Cholesterol: 141 mg/dL — ABNORMAL HIGH (ref 0–99)
NonHDL: 158.65
Total CHOL/HDL Ratio: 3
Triglycerides: 88 mg/dL (ref 0.0–149.0)
VLDL: 17.6 mg/dL (ref 0.0–40.0)

## 2023-12-13 LAB — HEMOGLOBIN A1C: Hgb A1c MFr Bld: 6.1 % (ref 4.6–6.5)

## 2023-12-13 LAB — T4, FREE: Free T4: 0.63 ng/dL (ref 0.60–1.60)

## 2023-12-13 LAB — VITAMIN D 25 HYDROXY (VIT D DEFICIENCY, FRACTURES): VITD: 49.19 ng/mL (ref 30.00–100.00)

## 2023-12-13 LAB — TSH: TSH: 7.14 u[IU]/mL — ABNORMAL HIGH (ref 0.35–5.50)

## 2023-12-13 MED ORDER — SERTRALINE HCL 100 MG PO TABS
100.0000 mg | ORAL_TABLET | Freq: Every day | ORAL | 3 refills | Status: DC
Start: 2023-12-13 — End: 2024-07-21

## 2023-12-13 NOTE — Assessment & Plan Note (Signed)
Ordered lipid panel, pending results. Work on low cholesterol diet and exercise as tolerated Continue lovastatin 20 mg nightly.  Lpa ordered as well pending results.

## 2023-12-13 NOTE — Assessment & Plan Note (Signed)
Pt requesting we repeat TSH as she wants to see her range, did advise typically we reserve this for endo however she did adjust her dose so I will draw TSH and free t4 today as f/u is not scheduled with Dr. Lafe Garin until next year. I plan to forward along to Dr. Lafe Garin. I did discuss and advise pt not to adjust her medication in the future and rather to maintain the direction of the endo on proper dosage and administration.  Pending results of tsh.

## 2023-12-13 NOTE — Assessment & Plan Note (Signed)
Worsening Increase sertraline to 100 mg once nightly

## 2023-12-13 NOTE — Assessment & Plan Note (Signed)
Ordered vitamin d pending results.   

## 2023-12-13 NOTE — Progress Notes (Signed)
Established Patient Office Visit  Subjective:      CC:  Chief Complaint  Patient presents with   Medical Management of Chronic Issues    HPI: Nicole Henry is a 62 y.o. female presenting on 12/13/2023 for Medical Management of Chronic Issues . Hypothyroid: seeing endo annually. On synthroid, she states she is taking 1.5 tablet M-F and then only one tablet on Saturday and Sunday. She states she made this change herself. She does see Dr. Lafe Garin. She does report increased fatigue, and weight gain despite exercise.   Hyperlipidemia: tolerating lovastatin and tolerating well. Denies myalgias.  Lab Results  Component Value Date   CHOL 281 (H) 06/01/2023   HDL 64.90 06/01/2023   LDLCALC 198 (H) 06/01/2023   LDLDIRECT 151.4 05/12/2013   TRIG 90.0 06/01/2023   CHOLHDL 4 06/01/2023    GAD: on sertraline 50 mg once daily , states at time doesn't feel as though it is working as well.        Social history:  Relevant past medical, surgical, family and social history reviewed and updated as indicated. Interim medical history since our last visit reviewed.  Allergies and medications reviewed and updated.  DATA REVIEWED: CHART IN EPIC     ROS: Negative unless specifically indicated above in HPI.    Current Outpatient Medications:    alendronate (FOSAMAX) 70 MG tablet, Take 1 tablet (70 mg total) by mouth every 7 (seven) days. Take with a full glass of water on an empty stomach. Remain upright for thirty minutes after taking., Disp: 12 tablet, Rfl: 3   cholecalciferol (VITAMIN D3) 25 MCG (1000 UNIT) tablet, Take 1,000 Units by mouth daily., Disp: , Rfl:    lovastatin (MEVACOR) 20 MG tablet, Take 1 tablet (20 mg total) by mouth at bedtime., Disp: 90 tablet, Rfl: 3   sertraline (ZOLOFT) 100 MG tablet, Take 1 tablet (100 mg total) by mouth daily., Disp: 30 tablet, Rfl: 3   SYNTHROID 50 MCG tablet, Take 1.5 tablets (75 mcg total) by mouth daily before breakfast., Disp: 75  tablet, Rfl: 3   tretinoin (RETIN-A) 0.05 % cream, Apply topically at bedtime., Disp: , Rfl:       Objective:    BP 112/72 (BP Location: Left Arm, Patient Position: Sitting, Cuff Size: Normal)   Pulse 83   Temp 98.2 F (36.8 C) (Temporal)   Ht 5\' 3"  (1.6 m)   SpO2 97%   BMI 24.94 kg/m   Wt Readings from Last 3 Encounters:  09/03/23 140 lb 12.8 oz (63.9 kg)  07/05/23 137 lb 9.6 oz (62.4 kg)  06/01/23 137 lb (62.1 kg)    Physical Exam Constitutional:      General: She is not in acute distress.    Appearance: Normal appearance. She is normal weight. She is not ill-appearing, toxic-appearing or diaphoretic.  HENT:     Head: Normocephalic.  Cardiovascular:     Rate and Rhythm: Normal rate.  Pulmonary:     Effort: Pulmonary effort is normal.  Musculoskeletal:        General: Normal range of motion.  Neurological:     General: No focal deficit present.     Mental Status: She is alert and oriented to person, place, and time. Mental status is at baseline.  Psychiatric:        Mood and Affect: Mood normal.        Behavior: Behavior normal.        Thought Content: Thought content normal.  Judgment: Judgment normal.           Assessment & Plan:  Influenza vaccine administered -     Flu vaccine trivalent PF, 6mos and older(Flulaval,Afluria,Fluarix,Fluzone)  Acquired hypothyroidism Assessment & Plan: Pt requesting we repeat TSH as she wants to see her range, did advise typically we reserve this for endo however she did adjust her dose so I will draw TSH and free t4 today as f/u is not scheduled with Dr. Lafe Garin until next year. I plan to forward along to Dr. Lafe Garin. I did discuss and advise pt not to adjust her medication in the future and rather to maintain the direction of the endo on proper dosage and administration.  Pending results of tsh.   Orders: -     TSH -     T4, free  Vitamin D deficiency Assessment & Plan: Ordered vitamin d pending results.     Orders: -     VITAMIN D 25 Hydroxy (Vit-D Deficiency, Fractures)  Mixed hyperlipidemia Assessment & Plan: Ordered lipid panel, pending results. Work on low cholesterol diet and exercise as tolerated Continue lovastatin 20 mg nightly.  Lpa ordered as well pending results.    Orders: -     Lipid panel -     Lipoprotein A (LPA)  GAD (generalized anxiety disorder) Assessment & Plan: Worsening Increase sertraline to 100 mg once nightly   Orders: -     Sertraline HCl; Take 1 tablet (100 mg total) by mouth daily.  Dispense: 30 tablet; Refill: 3  Prediabetes -     Hemoglobin A1c     Return in about 3 months (around 03/12/2024) for f/u cholesterol, f/u depression.  Mort Sawyers, MSN, APRN, FNP-C [facility]

## 2023-12-14 ENCOUNTER — Encounter: Payer: Self-pay | Admitting: Family

## 2023-12-14 ENCOUNTER — Other Ambulatory Visit: Payer: Self-pay | Admitting: Family

## 2023-12-14 DIAGNOSIS — E782 Mixed hyperlipidemia: Secondary | ICD-10-CM

## 2023-12-14 MED ORDER — LOVASTATIN 40 MG PO TABS
40.0000 mg | ORAL_TABLET | Freq: Every day | ORAL | 3 refills | Status: DC
Start: 2023-12-14 — End: 2024-08-21

## 2023-12-15 LAB — LIPOPROTEIN A (LPA): Lipoprotein (a): 75 nmol/L — ABNORMAL HIGH (ref <75)

## 2023-12-23 ENCOUNTER — Ambulatory Visit (INDEPENDENT_AMBULATORY_CARE_PROVIDER_SITE_OTHER): Payer: Federal, State, Local not specified - PPO

## 2023-12-23 DIAGNOSIS — Z23 Encounter for immunization: Secondary | ICD-10-CM | POA: Diagnosis not present

## 2023-12-23 NOTE — Progress Notes (Signed)
 Per orders of Mort Sawyers, NP, injection of shingrix given by Lewanda Rife in right deltoid. Patient tolerated injection well. This is 2nd dose of shingrix.

## 2024-02-18 ENCOUNTER — Ambulatory Visit (INDEPENDENT_AMBULATORY_CARE_PROVIDER_SITE_OTHER): Admitting: Family

## 2024-02-18 ENCOUNTER — Encounter: Payer: Self-pay | Admitting: Internal Medicine

## 2024-02-18 ENCOUNTER — Encounter: Payer: Self-pay | Admitting: Family

## 2024-02-18 VITALS — BP 126/72 | HR 74 | Temp 97.8°F | Ht 63.0 in | Wt 139.8 lb

## 2024-02-18 DIAGNOSIS — R7303 Prediabetes: Secondary | ICD-10-CM

## 2024-02-18 DIAGNOSIS — Z7185 Encounter for immunization safety counseling: Secondary | ICD-10-CM

## 2024-02-18 DIAGNOSIS — E782 Mixed hyperlipidemia: Secondary | ICD-10-CM | POA: Diagnosis not present

## 2024-02-18 DIAGNOSIS — K649 Unspecified hemorrhoids: Secondary | ICD-10-CM

## 2024-02-18 DIAGNOSIS — Z79899 Other long term (current) drug therapy: Secondary | ICD-10-CM | POA: Diagnosis not present

## 2024-02-18 DIAGNOSIS — F411 Generalized anxiety disorder: Secondary | ICD-10-CM

## 2024-02-18 DIAGNOSIS — E039 Hypothyroidism, unspecified: Secondary | ICD-10-CM

## 2024-02-18 LAB — LIPID PANEL
Cholesterol: 183 mg/dL (ref 0–200)
HDL: 63.4 mg/dL (ref >39.00)
LDL Cholesterol: 99 mg/dL (ref 0–99)
NonHDL: 119.48
Total CHOL/HDL Ratio: 3
Triglycerides: 101 mg/dL (ref 0.0–149.0)
VLDL: 20.2 mg/dL (ref 0.0–40.0)

## 2024-02-18 LAB — COMPREHENSIVE METABOLIC PANEL
ALT: 19 U/L (ref 0–35)
AST: 23 U/L (ref 0–37)
Albumin: 4.5 g/dL (ref 3.5–5.2)
Alkaline Phosphatase: 70 U/L (ref 39–117)
BUN: 12 mg/dL (ref 6–23)
CO2: 29 meq/L (ref 19–32)
Calcium: 10.1 mg/dL (ref 8.4–10.5)
Chloride: 103 meq/L (ref 96–112)
Creatinine, Ser: 0.68 mg/dL (ref 0.40–1.20)
GFR: 93.42 mL/min (ref >60.00)
Glucose, Bld: 98 mg/dL (ref 70–99)
Potassium: 4.5 meq/L (ref 3.5–5.1)
Sodium: 141 meq/L (ref 135–145)
Total Bilirubin: 0.4 mg/dL (ref 0.2–1.2)
Total Protein: 7.6 g/dL (ref 6.0–8.3)

## 2024-02-18 LAB — HEMOGLOBIN A1C: Hgb A1c MFr Bld: 6.1 % (ref 4.6–6.5)

## 2024-02-18 MED ORDER — HYDROCORTISONE (PERIANAL) 2.5 % EX CREA
1.0000 | TOPICAL_CREAM | Freq: Two times a day (BID) | CUTANEOUS | 0 refills | Status: DC
Start: 2024-02-18 — End: 2024-03-20

## 2024-02-18 NOTE — Progress Notes (Signed)
 Established Patient Office Visit  Subjective:   Patient ID: Nicole Henry, female    DOB: 06/02/1961  Age: 63 y.o. MRN: [id number]  CC:  Chief Complaint  Patient presents with   Hemorrhoids    HPI: Nicole Henry is a 63 y.o. female presenting on 02/18/2024 for Hemorrhoids   HLD: doing well with increase lovastatin 40 mg. She denies any myalgias.  She has been working on a low cholesterol diet.   Hypothyroid, followed by endo with synthroid 75 mcg once daily   Has scheduled her colonoscopy , it will be at the end of this month.   Had a very painful hemorrhoid in the last few weeks, but has started some otc cream and it has been helping. No longer painful. She has noted some constipation with her thyroid being elevated.       ROS: Negative unless specifically indicated above in HPI.   Relevant past medical history reviewed and updated as indicated.   Allergies and medications reviewed and updated.   Current Outpatient Medications:    alendronate (FOSAMAX) 70 MG tablet, Take 1 tablet (70 mg total) by mouth every 7 (seven) days. Take with a full glass of water on an empty stomach. Remain upright for thirty minutes after taking., Disp: 12 tablet, Rfl: 3   cholecalciferol (VITAMIN D3) 25 MCG (1000 UNIT) tablet, Take 1,000 Units by mouth daily., Disp: , Rfl:    hydrocortisone (ANUSOL-HC) 2.5 % rectal cream, Place 1 Application rectally 2 (two) times daily., Disp: 30 g, Rfl: 0   lovastatin (MEVACOR) 40 MG tablet, Take 1 tablet (40 mg total) by mouth at bedtime., Disp: 90 tablet, Rfl: 3   sertraline (ZOLOFT) 100 MG tablet, Take 1 tablet (100 mg total) by mouth daily., Disp: 30 tablet, Rfl: 3   SYNTHROID 50 MCG tablet, Take 1.5 tablets (75 mcg total) by mouth daily before breakfast., Disp: 75 tablet, Rfl: 3   tretinoin (RETIN-A) 0.05 % cream, Apply topically at bedtime., Disp: , Rfl:   Allergies  Allergen Reactions   Estradiol Rash   Ciprofloxacin     Tolerable per pt  Was  given in 2019   Crestor [Rosuvastatin]    Nitrofurantoin Other (See Comments)    unknown   Pravastatin Other (See Comments)    myalgias   Simvastatin Other (See Comments)    myaglias   Penicillins Rash   Sulfonamide Derivatives Rash   Yellow Dyes (Non-Tartrazine) Rash    Objective:   BP 126/72 (BP Location: Left Arm, Patient Position: Sitting, Cuff Size: Normal)   Pulse 74   Temp 97.8 F (36.6 C) (Temporal)   Ht 5\' 3"  (1.6 m)   Wt 139 lb 12.8 oz (63.4 kg)   SpO2 98%   BMI 24.76 kg/m    Physical Exam Constitutional:      General: She is not in acute distress.    Appearance: Normal appearance. She is normal weight. She is not ill-appearing, toxic-appearing or diaphoretic.  HENT:     Head: Normocephalic.  Cardiovascular:     Rate and Rhythm: Normal rate and regular rhythm.  Pulmonary:     Effort: Pulmonary effort is normal.     Breath sounds: Normal breath sounds.  Musculoskeletal:        General: Normal range of motion.     Right lower leg: No edema.     Left lower leg: No edema.  Neurological:     General: No focal deficit present.     Mental  Status: She is alert and oriented to person, place, and time. Mental status is at baseline.  Psychiatric:        Mood and Affect: Mood normal.        Behavior: Behavior normal.        Thought Content: Thought content normal.        Judgment: Judgment normal.     Assessment & Plan:  Mixed hyperlipidemia Assessment & Plan: Ordered lipid panel, pending results. Work on low cholesterol diet and exercise as tolerated Continue lovastatin 40 mg nightly.     Orders: -     Lipid panel  Prediabetes Assessment & Plan: Pt advised of the following: Work on a diabetic diet, try to incorporate exercise at least 20-30 a day for 3 days a week or more.    Orders: -     Hemoglobin A1c -     Comprehensive metabolic panel  On statin therapy -     Comprehensive metabolic panel  Hemorrhoids, unspecified hemorrhoid  type Assessment & Plan: On path to resolution  Discussed conservative measures  Sent in rx anusol in case recurrence Can utilize sitz baths Avoid straining as able Stool softener if needed  Orders: -     Hydrocortisone (Perianal); Place 1 Application rectally 2 (two) times daily.  Dispense: 30 g; Refill: 0  Encounter for counseling regarding immunization -     Measles/Mumps/Rubella Immunity; Future  GAD (generalized anxiety disorder) Assessment & Plan: Continue sertraline to 100 mg once nightly    Acquired hypothyroidism Assessment & Plan: Follow with endo as scheduled continue synthroid as prescribed.      Follow up plan: Return in about 6 months (around 08/20/2024) for f/u cholesterol.  Mort Sawyers, FNP

## 2024-02-19 LAB — MEASLES/MUMPS/RUBELLA IMMUNITY
Mumps IgG: >300 [AU]/ml
Rubella: 16 {index}
Rubeola IgG: >300 [AU]/ml

## 2024-02-21 ENCOUNTER — Encounter: Payer: Self-pay | Admitting: Family

## 2024-02-22 DIAGNOSIS — R7303 Prediabetes: Secondary | ICD-10-CM | POA: Insufficient documentation

## 2024-02-22 DIAGNOSIS — K649 Unspecified hemorrhoids: Secondary | ICD-10-CM | POA: Insufficient documentation

## 2024-02-22 NOTE — Assessment & Plan Note (Signed)
 Ordered lipid panel, pending results. Work on low cholesterol diet and exercise as tolerated Continue lovastatin 40 mg nightly.

## 2024-02-22 NOTE — Assessment & Plan Note (Signed)
 Follow with endo as scheduled continue synthroid as prescribed.

## 2024-02-22 NOTE — Assessment & Plan Note (Signed)
 Pt advised of the following: Work on a diabetic diet, try to incorporate exercise at least 20-30 a day for 3 days a week or more.

## 2024-02-22 NOTE — Assessment & Plan Note (Signed)
 Continue sertraline to 100 mg once nightly

## 2024-02-22 NOTE — Assessment & Plan Note (Signed)
 On path to resolution  Discussed conservative measures  Sent in rx anusol in case recurrence Can utilize sitz baths Avoid straining as able Stool softener if needed

## 2024-02-25 ENCOUNTER — Encounter

## 2024-02-29 ENCOUNTER — Ambulatory Visit (AMBULATORY_SURGERY_CENTER)

## 2024-02-29 VITALS — Ht 63.0 in | Wt 135.0 lb

## 2024-02-29 DIAGNOSIS — Z1211 Encounter for screening for malignant neoplasm of colon: Secondary | ICD-10-CM

## 2024-02-29 MED ORDER — NA SULFATE-K SULFATE-MG SULF 17.5-3.13-1.6 GM/177ML PO SOLN
1.0000 | Freq: Once | ORAL | 0 refills | Status: AC
Start: 2024-02-29 — End: 2024-02-29

## 2024-02-29 NOTE — Progress Notes (Signed)

## 2024-03-08 ENCOUNTER — Encounter: Payer: Self-pay | Admitting: Family

## 2024-03-08 DIAGNOSIS — Z789 Other specified health status: Secondary | ICD-10-CM | POA: Insufficient documentation

## 2024-03-09 ENCOUNTER — Encounter: Payer: Self-pay | Admitting: Internal Medicine

## 2024-03-09 ENCOUNTER — Ambulatory Visit: Admitting: Internal Medicine

## 2024-03-09 VITALS — BP 120/71 | HR 102 | Temp 98.0°F | Resp 12 | Ht 63.0 in | Wt 135.0 lb

## 2024-03-09 DIAGNOSIS — Z1211 Encounter for screening for malignant neoplasm of colon: Secondary | ICD-10-CM | POA: Diagnosis not present

## 2024-03-09 DIAGNOSIS — K648 Other hemorrhoids: Secondary | ICD-10-CM

## 2024-03-09 DIAGNOSIS — K5730 Diverticulosis of large intestine without perforation or abscess without bleeding: Secondary | ICD-10-CM

## 2024-03-09 MED ORDER — SODIUM CHLORIDE 0.9 % IV SOLN: 500.0000 mL | Freq: Once | INTRAVENOUS | Status: DC

## 2024-03-09 NOTE — Op Note (Signed)
 [facility] Patient Name: Nicole Henry Procedure Date: 03/09/2024 9:10 AM MRN: [id number] Endoscopist: Particia Lather , , [id number] Age: 63 Referring MD:  Date of Birth: Sep 06, 1961 Gender: Female Account #: [id number] Procedure:                Colonoscopy Indications:              Screening for colorectal malignant neoplasm Medicines:                Monitored Anesthesia Care Procedure:                Pre-Anesthesia Assessment:                           - Prior to the procedure, a History and Physical                            was performed, and patient medications and                            allergies were reviewed. The patient's tolerance of                            previous anesthesia was also reviewed. The risks                            and benefits of the procedure and the sedation                            options and risks were discussed with the patient.                            All questions were answered, and informed consent                            was obtained. Prior Anticoagulants: The patient has                            taken no anticoagulant or antiplatelet agents. ASA                            Grade Assessment: II - A patient with mild systemic                            disease. After reviewing the risks and benefits,                            the patient was deemed in satisfactory condition to                            undergo the procedure.                           After obtaining informed consent, the colonoscope  was passed under direct vision. Throughout the                            procedure, the patient's blood pressure, pulse, and                            oxygen saturations were monitored continuously. The                            Olympus Scope SN: [id number] was introduced through                            the anus and advanced to the the terminal ileum.                            The  colonoscopy was performed without difficulty.                            The patient tolerated the procedure well. The                            quality of the bowel preparation was excellent. The                            terminal ileum, ileocecal valve, appendiceal                            orifice, and rectum were photographed. Scope In: 9:20:54 AM Scope Out: 9:37:37 AM Scope Withdrawal Time: 0 hours 13 minutes 9 seconds  Total Procedure Duration: 0 hours 16 minutes 43 seconds  Findings:                 The terminal ileum appeared normal.                           Multiple diverticula were found in the sigmoid                            colon.                           Non-bleeding internal hemorrhoids were found during                            retroflexion. Complications:            No immediate complications. Estimated Blood Loss:     Estimated blood loss: none. Impression:               - The examined portion of the ileum was normal.                           - Diverticulosis in the sigmoid colon.                           - Non-bleeding internal hemorrhoids.                           -  No specimens collected. Recommendation:           - Discharge patient to home (with escort).                           - Repeat colonoscopy in 10 years for screening                            purposes.                           - The findings and recommendations were discussed                            with the patient. Dr Particia Lather "[place]" [place],  03/09/2024 9:42:16 AM

## 2024-03-09 NOTE — Patient Instructions (Signed)
 Educational handout provided to patient related to Hemorrhoids and Diverticulosis  Resume previous diet  Continue present medications  REPEAT COLONOSCOPY IN 10 YEARS FOR SCREENING PURPOSES   YOU HAD AN ENDOSCOPIC PROCEDURE TODAY AT THE [facility]:   Refer to the procedure report that was given to you for any specific questions about what was found during the examination.  If the procedure report does not answer your questions, please call your gastroenterologist to clarify.  If you requested that your care partner not be given the details of your procedure findings, then the procedure report has been included in a sealed envelope for you to review at your convenience later.  YOU SHOULD EXPECT: Some feelings of bloating in the abdomen. Passage of more gas than usual.  Walking can help get rid of the air that was put into your GI tract during the procedure and reduce the bloating. If you had a lower endoscopy (such as a colonoscopy or flexible sigmoidoscopy) you may notice spotting of blood in your stool or on the toilet paper. If you underwent a bowel prep for your procedure, you may not have a normal bowel movement for a few days.  Please Note:  You might notice some irritation and congestion in your nose or some drainage.  This is from the oxygen used during your procedure.  There is no need for concern and it should clear up in a day or so.  SYMPTOMS TO REPORT IMMEDIATELY:  Following lower endoscopy (colonoscopy or flexible sigmoidoscopy):  Excessive amounts of blood in the stool  Significant tenderness or worsening of abdominal pains  Swelling of the abdomen that is new, acute  Fever of 100F or higher  For urgent or emergent issues, a gastroenterologist can be reached at any hour by calling [phone]. Do not use MyChart messaging for urgent concerns.    DIET:  We do recommend a small meal at first, but then you may proceed to your regular diet.  Drink plenty of  fluids but you should avoid alcoholic beverages for 24 hours.  ACTIVITY:  You should plan to take it easy for the rest of today and you should NOT DRIVE or use heavy machinery until tomorrow (because of the sedation medicines used during the test).    FOLLOW UP: Our staff will call the number listed on your records the next business day following your procedure.  We will call around 7:15- 8:00 am to check on you and address any questions or concerns that you may have regarding the information given to you following your procedure. If we do not reach you, we will leave a message.     If any biopsies were taken you will be contacted by phone or by letter within the next 1-3 weeks.  Please call us at [phone] if you have not heard about the biopsies in 3 weeks.    SIGNATURES/CONFIDENTIALITY: You and/or your care partner have signed paperwork which will be entered into your electronic medical record.  These signatures attest to the fact that that the information above on your After Visit Summary has been reviewed and is understood.  Full responsibility of the confidentiality of this discharge information lies with you and/or your care-partner.

## 2024-03-09 NOTE — Progress Notes (Signed)
 Pt's states no medical or surgical changes since previsit or office visit.

## 2024-03-09 NOTE — Progress Notes (Signed)
 Report to PACU, RN, vss, BBS= Clear.

## 2024-03-09 NOTE — Progress Notes (Signed)
 GASTROENTEROLOGY PROCEDURE H&P NOTE   Primary Care Physician: Mort Sawyers, FNP    Reason for Procedure:   Colon cancer screening  Plan:    Colonoscopy  Patient is appropriate for endoscopic procedure(s) in the ambulatory ([facility]) setting.  The nature of the procedure, as well as the risks, benefits, and alternatives were carefully and thoroughly reviewed with the patient. Ample time for discussion and questions allowed. The patient understood, was satisfied, and agreed to proceed.     HPI: Nicole Henry is a 63 y.o. female who presents for colonoscopy for colon cancer screening. Denies blood in stools, changes in bowel habits, or unintentional weight loss. Denies family history of colon cancer. Last colonoscopy in 2013 was normal.    Past Medical History:  Diagnosis Date   Allergic rhinitis due to pollen    Allergy    Anxiety    Basal cell carcinoma    history of   BASAL CELL CARCINOMA, HX OF 11/22/2008   Qualifier: Diagnosis of  By: Ermalene Searing MD, Amy     Encounter for general adult medical examination with abnormal findings 06/01/2023   Family history of diabetes mellitus    GERD (gastroesophageal reflux disease)    Hypertension    Hypothyroidism    Localized osteoporosis without current pathological fracture 06/01/2023   Myocardial infarction [facility])    Pure hypercholesterolemia    Rosacea    Screening for lipoid disorders     Past Surgical History:  Procedure Laterality Date   AUGMENTATION MAMMAPLASTY Bilateral    1999   BREAST ENHANCEMENT SURGERY  1997    Prior to Admission medications   Medication Sig Start Date End Date Taking? Authorizing Provider  alendronate (FOSAMAX) 70 MG tablet Take 1 tablet (70 mg total) by mouth every 7 (seven) days. Take with a full glass of water on an empty stomach. Remain upright for thirty minutes after taking. 06/01/23  Yes Dugal, Wyatt Mage, FNP  cholecalciferol (VITAMIN D3) 25 MCG (1000 UNIT) tablet Take 1,000 Units by mouth  daily.   Yes [provider]  lovastatin (MEVACOR) 40 MG tablet Take 1 tablet (40 mg total) by mouth at bedtime. Patient taking differently: Take 80 mg by mouth at bedtime. 12/14/23  Yes Dugal, Wyatt Mage, FNP  sertraline (ZOLOFT) 100 MG tablet Take 1 tablet (100 mg total) by mouth daily. 12/13/23  Yes Dugal, Wyatt Mage, FNP  SYNTHROID 50 MCG tablet Take 1.5 tablets (75 mcg total) by mouth daily before breakfast. Patient taking differently: Take 100 mcg by mouth daily before breakfast. 07/05/23  Yes Carlus Pavlov, MD  tretinoin (RETIN-A) 0.05 % cream Apply topically at bedtime. 05/21/23  Yes [provider]  hydrocortisone (ANUSOL-HC) 2.5 % rectal cream Place 1 Application rectally 2 (two) times daily. Patient not taking: Reported on 03/09/2024 02/18/24   Mort Sawyers, FNP    Current Outpatient Medications  Medication Sig Dispense Refill   alendronate (FOSAMAX) 70 MG tablet Take 1 tablet (70 mg total) by mouth every 7 (seven) days. Take with a full glass of water on an empty stomach. Remain upright for thirty minutes after taking. 12 tablet 3   cholecalciferol (VITAMIN D3) 25 MCG (1000 UNIT) tablet Take 1,000 Units by mouth daily.     lovastatin (MEVACOR) 40 MG tablet Take 1 tablet (40 mg total) by mouth at bedtime. (Patient taking differently: Take 80 mg by mouth at bedtime.) 90 tablet 3   sertraline (ZOLOFT) 100 MG tablet Take 1 tablet (100 mg total) by mouth daily. 30 tablet  3   SYNTHROID 50 MCG tablet Take 1.5 tablets (75 mcg total) by mouth daily before breakfast. (Patient taking differently: Take 100 mcg by mouth daily before breakfast.) 75 tablet 3   tretinoin (RETIN-A) 0.05 % cream Apply topically at bedtime.     hydrocortisone (ANUSOL-HC) 2.5 % rectal cream Place 1 Application rectally 2 (two) times daily. (Patient not taking: Reported on 03/09/2024) 30 g 0   Current Facility-Administered Medications  Medication Dose Route Frequency Provider Last Rate Last Admin   0.9 %   sodium chloride infusion  500 mL Intravenous Once Imogene Burn, MD        Allergies as of 03/09/2024 - Review Complete 03/09/2024  Allergen Reaction Noted   Estradiol Rash 06/24/2021   Ciprofloxacin Other (See Comments) 03/09/2018   Crestor [rosuvastatin] Other (See Comments) 04/13/2023   Nitrofurantoin Other (See Comments)    Penicillins Rash    Pravastatin Other (See Comments) 06/01/2023   Simvastatin Other (See Comments) 07/16/2014   Sulfonamide derivatives Rash 01/14/2008   Yellow dyes (non-tartrazine) Rash 04/18/2012    Family History  Problem Relation Age of Onset   Hypertension Mother    Hyperlipidemia Mother    Hyperlipidemia Father    Hypertension Father    Prostate cancer Father    Heart disease Brother        ? 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   Diet- fruits and veggies.   Social Drivers of Corporate investment banker Strain: Not on file  Food Insecurity: Not on file  Transportation Needs: Not on file  Physical Activity: Not on file  Stress: Not on file  Social Connections: Not  on file  Intimate Partner Violence: Not on file    Physical Exam: Vital signs in last 24 hours: BP (!) 163/82   Pulse 76   Temp 98 F (36.7 C) (Skin)   Ht 5\' 3"  (1.6 m)   Wt 135 lb (61.2 kg)   SpO2 100%   BMI 23.91 kg/m  GEN: NAD EYE: Sclerae anicteric ENT: MMM CV: Non-tachycardic Pulm: No increased work of breathing GI: Soft, NT/ND NEURO:  Alert & Oriented   Eulah Pont, MD [facility]  03/09/2024 9:14 AM

## 2024-03-10 ENCOUNTER — Telehealth: Payer: Self-pay

## 2024-03-10 NOTE — Telephone Encounter (Signed)
  Follow up Call-     03/09/2024    8:29 AM  Call back number  Post procedure Call Back phone  # [phone]  Permission to leave phone message Yes     Patient questions:  Do you have a fever, pain , or abdominal swelling? No. Pain Score  0 *  Have you tolerated food without any problems? Yes.    Have you been able to return to your normal activities? Yes.    Do you have any questions about your discharge instructions: Diet   No. Medications  No. Follow up visit  No.  Do you have questions or concerns about your Care? No.  Actions: * If pain score is 4 or above: No action needed, pain <4.

## 2024-03-13 ENCOUNTER — Ambulatory Visit: Payer: Federal, State, Local not specified - PPO | Admitting: Family

## 2024-03-14 ENCOUNTER — Ambulatory Visit: Admitting: Family

## 2024-03-20 ENCOUNTER — Telehealth: Payer: Self-pay | Admitting: Family

## 2024-03-20 ENCOUNTER — Ambulatory Visit: Admitting: Family

## 2024-03-20 ENCOUNTER — Encounter: Payer: Self-pay | Admitting: Family

## 2024-03-20 ENCOUNTER — Other Ambulatory Visit: Payer: Self-pay | Admitting: Internal Medicine

## 2024-03-20 NOTE — Telephone Encounter (Signed)
 Please do not charge pt for today's visit was not needed (03/20/24)

## 2024-03-20 NOTE — Telephone Encounter (Signed)
 Appointment cancelled

## 2024-03-20 NOTE — Progress Notes (Signed)
 Erroneous encounter.  Pt did not need f/u appt, nothing discussed today. No charge.

## 2024-06-29 ENCOUNTER — Other Ambulatory Visit: Payer: Self-pay | Admitting: Internal Medicine

## 2024-07-03 ENCOUNTER — Telehealth: Payer: Self-pay | Admitting: Family

## 2024-07-03 NOTE — Telephone Encounter (Signed)
 Patient states she could not get an appointment sooner for ear pain with pain in bone of top of right cheek as well, pain started on 7.20.25  Scheduled pt for 1:20 pm on 7.23.25, added to cancellation list, patient wanted to see if she could get in sooner, offered other providers, but patient wants to see her provider  Please advise

## 2024-07-03 NOTE — Telephone Encounter (Signed)
 D/w Nicole Henry unfortunately and will have to wait for wednesday. Agree with UC precautions.

## 2024-07-04 ENCOUNTER — Encounter: Payer: Self-pay | Admitting: Internal Medicine

## 2024-07-04 ENCOUNTER — Ambulatory Visit: Payer: Federal, State, Local not specified - PPO | Admitting: Internal Medicine

## 2024-07-04 VITALS — BP 130/60 | HR 72 | Ht 63.0 in | Wt 137.6 lb

## 2024-07-04 DIAGNOSIS — E559 Vitamin D deficiency, unspecified: Secondary | ICD-10-CM

## 2024-07-04 DIAGNOSIS — M810 Age-related osteoporosis without current pathological fracture: Secondary | ICD-10-CM

## 2024-07-04 DIAGNOSIS — E063 Autoimmune thyroiditis: Secondary | ICD-10-CM

## 2024-07-04 DIAGNOSIS — R7303 Prediabetes: Secondary | ICD-10-CM | POA: Diagnosis not present

## 2024-07-04 LAB — TSH: TSH: 0.11 m[IU]/L — ABNORMAL LOW (ref 0.40–4.50)

## 2024-07-04 LAB — T4, FREE: Free T4: 1.4 ng/dL (ref 0.8–1.8)

## 2024-07-04 NOTE — Patient Instructions (Signed)
Please stop at the lab.  Please continue Synthroid 100 mcg daily.  Take the thyroid hormone every day, with water, at least 30 minutes before breakfast, separated by at least 4 hours from: - acid reflux medications - calcium - iron - multivitamins  Please come back for a follow-up appointment in 1 year.  

## 2024-07-04 NOTE — Progress Notes (Signed)
 Patient ID: Nicole Henry, female   DOB: 1960/12/30, 63 y.o.   MRN: [id number]   HPI  Nicole Henry is a 63 y.o.-year-old female, returning for follow-up for uncontrolled Hashimoto's hypothyroidism, vitamin D  deficiency, osteoporosis, prediabetes. Last visit 1 year ago.  Interim history: She previously had hair loss, constipation, and fatigue, but these resolved.  She feels better on the higher dose of Synthroid . No falls or fractures since last visit.  No jaw/hip/thigh pain.  Reviewed history: Pt. has been dx with hypothyroidism in 2009 (fatigue, TSH 16) >> started on Synthroid  d.a.w.  She cannot take levothyroxine with dyes, therefore, she is on white, 50 mcg, tablets.  She is currently on Synthroid  d.a.w. 100 mcg daily, dose increased 11/2023: - in am - fasting - at least 30 min from b'fast - no Ca, Fe - prev. MVI, PPIs -later in the day-stopped - not on Biotin On vitamin D .  TFTs reviewed: Lab Results  Component Value Date   TSH 7.14 (H) 12/13/2023   TSH 0.22 (L) 07/05/2023   TSH 5.93 (H) 06/01/2023   TSH 1.72 10/07/2022   TSH 0.02 (L) 07/02/2022   TSH 0.62 01/07/2022   TSH 4.49 07/01/2021   TSH 2.50 07/01/2020   TSH 0.12 (L) 09/28/2019   TSH 11.15 (H) 07/03/2019   FREET4 0.63 12/13/2023   FREET4 1.32 07/05/2023   FREET4 1.03 06/01/2023   FREET4 1.00 10/07/2022   FREET4 0.94 07/02/2022   FREET4 0.96 07/01/2021   FREET4 0.60 07/01/2020   FREET4 1.26 09/28/2019   FREET4 0.70 07/03/2019   FREET4 1.00 12/29/2018   T3FREE 2.8 03/08/2018   T3FREE 2.7 07/25/2014   She had elevated antithyroid antibodies: Lab Results  Component Value Date   THGAB 9 (H) 11/21/2018   THGAB 9 (H) 06/27/2018   Component     Latest Ref Rng & Units 06/27/2018 11/21/2018  Thyroperoxidase Ab SerPl-aCnc     0 - 34 IU/mL 127 (H) 61 (H)   No FH of thyroid  cancer. No h/o radiation tx to head or neck. No Biotin use. No recent steroids use.   Vitamin D  deficiency:  Reviewed vitamin D   levels: Lab Results  Component Value Date   VD25OH 49.19 12/13/2023   VD25OH 30.09 06/01/2023   VD25OH 25.35 (L) 03/04/2022   VD25OH 30.84 01/07/2022   VD25OH 34.3 07/01/2021   VD25OH 32.2 07/01/2020   VD25OH 36.53 07/03/2019   VD25OH 26.82 (L) 11/21/2018   VD25OH 30.35 10/03/2018   VD25OH 23.38 (L) 06/27/2018   Previously on vitamin D3 5000 units daily + multivitamin, but she came off.  I advised her to restart vitamin D  at 2000 units daily.  She is on this dose now.  Osteoporosis:  Reviewed previous bone density reports: DXA (12/29/2022) - [facility] L1-L4:  -1.5 (-1.1%) RFN: -2.5 LFN: -2.3  DXA (09/01/2018) - [facility]: RFN 09/01/2018        -2.4   RFN 06/29/2016        -2.5  LFN 09/01/2018       -2.3 LFN 06/29/2016       -2.2  AP Spine  L1-L4      09/01/2018    56.8         -1.4    1.015 g/cm2 (+1%) AP Spine  L1-L4      06/29/2016    54.6         -1.5    1.005 g/cm2  DualFemur Total Mean 09/01/2018    56.8         -  1.3    0.838 g/cm2 DualFemur Total Mean 06/29/2016    54.6         -1.5    0.824 g/cm2  FRAX: Major osteoporosis fracture risk over 10 years: 9.5% Hip fracture risk over 10 years: 1.6%  Stable T-scores, in the osteopenic range.  Calcium  levels are normal: Lab Results  Component Value Date   CALCIUM  10.1 02/18/2024   CALCIUM  9.2 06/01/2023   CALCIUM  9.2 03/04/2022   CALCIUM  9.6 02/13/2022   CALCIUM  9.5 01/07/2022   CALCIUM  9.2 07/01/2021   CALCIUM  9.5 05/15/2020   CALCIUM  9.0 02/20/2019   CALCIUM  9.7 11/09/2017   CALCIUM  9.8 09/15/2017   Kidney function is normal: Lab Results  Component Value Date   BUN 12 02/18/2024   BUN 12 06/01/2023   Lab Results  Component Value Date   CREATININE 0.68 02/18/2024   CREATININE 0.80 06/01/2023   She also has a history of hyperlipidemia.  She is on Lovastatin  - stated since last OV.  She also has a history of high HbA1c -prediabetes: Lab Results  Component Value Date   HGBA1C 6.1 02/18/2024    HGBA1C 6.1 12/13/2023   HGBA1C 5.9 07/05/2023   HGBA1C 6.1 07/02/2022   HGBA1C 6.1 03/04/2022   Lab Results   HGBA1C 5.9 01/07/2022   HGBA1C 6.0 07/01/2021   She was diagnosed with interstitial cystitis in 2023. She tried a regular estradiol tablet and had a reaction to the dyes.  ROS: + see HPI  I reviewed pt's medications, allergies, PMH, social hx, family hx, and changes were documented in the history of present illness. Otherwise, unchanged from my initial visit note.  Past Medical History:  Diagnosis Date   Allergic rhinitis due to pollen    Allergy    Anxiety    Basal cell carcinoma    history of   BASAL CELL CARCINOMA, HX OF 11/22/2008   Qualifier: Diagnosis of  By: Avelina MD, Amy     Encounter for general adult medical examination with abnormal findings 06/01/2023   Family history of diabetes mellitus    GERD (gastroesophageal reflux disease)    Hypertension    Hypothyroidism    Localized osteoporosis without current pathological fracture 06/01/2023   Myocardial infarction (HCC)    Pure hypercholesterolemia    Rosacea    Screening for lipoid disorders    Past Surgical History:  Procedure Laterality Date   AUGMENTATION MAMMAPLASTY Bilateral    1999   BREAST ENHANCEMENT SURGERY  1997   Social History   Socioeconomic History   Marital status: Married    Spouse name: Not on file   Number of children: 2   Years of education: Not on file   Highest education level: Not on file  Occupational History    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    Diet- fruits and veggies.   Social Drivers of Corporate investment banker Strain: Not on file  Food Insecurity: Low Risk  (06/12/2024)  Received from [facility]   Hunger Vital Sign    Within the past 12 months, you worried that your food would run out before you got money to buy more: Never true    Within the past 12 months, the food you bought just didn't last and you didn't have money to get more. : Never true  Transportation Needs: No Transportation Needs (06/12/2024)   Received from [facility]    In the past 12 months, has lack of reliable transportation kept you from medical appointments, meetings, work or from getting things needed for daily living? : No  Physical Activity: Not on file  Stress: Not on file  Social Connections: Not on file  Intimate Partner Violence: Not on file   Current Outpatient Medications on File Prior to Visit  Medication Sig Dispense Refill   alendronate  (FOSAMAX ) 70 MG tablet Take 1 tablet (70 mg total) by mouth every 7 (seven) days. Take with a full glass of water on an empty stomach. Remain upright for thirty minutes after taking. 12 tablet 3   cholecalciferol (VITAMIN D3) 25 MCG (1000 UNIT) tablet Take 1,000 Units by mouth daily.     lovastatin  (MEVACOR ) 40 MG tablet Take 1 tablet (40 mg total) by mouth at bedtime. (Patient taking differently: Take 80 mg by mouth at bedtime.) 90 tablet 3   sertraline  (ZOLOFT ) 100 MG tablet Take 1 tablet (100 mg total) by mouth daily. 30 tablet 3   SYNTHROID  50 MCG tablet TAKE 2 TABLETS(100 MCG) BY MOUTH DAILY BEFORE BREAKFAST 60 tablet 2   tretinoin (RETIN-A) 0.05 % cream Apply topically at bedtime.     No current facility-administered medications on file prior to visit.   Allergies  Allergen Reactions   Estradiol Rash   Ciprofloxacin Other (See Comments)    Tolerable per pt  Was given in 2019   Crestor  [Rosuvastatin ] Other (See Comments)   Nitrofurantoin Other (See Comments)    Rash    Penicillins Rash   Pravastatin  Other (See Comments)    myalgias   Simvastatin  Other (See Comments)    myaglias   Sulfonamide Derivatives Rash   Yellow Dyes (Non-Tartrazine) Rash   Family History  Problem Relation Age of Onset   Hypertension Mother    Hyperlipidemia Mother    Hyperlipidemia Father    Hypertension Father    Prostate cancer Father    Heart disease Brother        ? heart issue   Heart attack Paternal Grandfather        late age   Bladder Cancer Neg Hx    Kidney cancer Neg Hx    Colon cancer Neg Hx    Colon polyps Neg Hx    Esophageal cancer Neg Hx    Rectal cancer Neg Hx    Stomach cancer Neg Hx     PE: BP 130/60   Pulse 72   Ht 5' 3 (1.6 m)   Wt 137 lb 9.6 oz (62.4 kg)   SpO2 98%   BMI 24.37 kg/m  Wt Readings from Last 15 Encounters:  07/04/24 137 lb 9.6 oz (62.4 kg)  03/20/24 139 lb 8 oz (63.3 kg)  03/09/24 135 lb (61.2 kg)  02/29/24 135 lb (61.2 kg)  02/18/24 139 lb 12.8 oz (63.4 kg)  12/13/23 140 lb 12.8 oz (63.9 kg)  09/03/23 140 lb 12.8 oz (63.9 kg)  07/05/23 137 lb 9.6 oz (62.4 kg)  06/01/23 137 lb (62.1 kg)  04/13/23 135  lb 12.8 oz (61.6 kg)  07/02/22 134 lb (60.8 kg)  05/12/22 135 lb 1 oz (61.3 kg)  02/17/22 132 lb (59.9 kg)  11/24/21 132 lb 3.2 oz (60 kg)  09/23/21 136 lb (61.7 kg)   Constitutional: normal weight, in NAD Eyes: EOMI, no exophthalmos ENT: no thyromegaly, no cervical lymphadenopathy, Ear canals nonerythematous, tympanic membrane cloudier in the right ear, without fluid levels Cardiovascular: RRR, No MRG Respiratory: CTA B Musculoskeletal: no deformities Skin:  no rashes Neurological: no tremor with outstretched hands  ASSESSMENT: 1. Hypothyroidism due to Hashimoto's thyroiditis  2. Osteoporosis   3. Vit D def  4. Prediabetes  PLAN:  1.  Patient with longstanding hypothyroidism, on 3 brand-name 50 mcg tablets due to the lack of dyes.  At last visit she was on the equivalent of 85 mcg daily (100 mcg 5/7 days  and 50 mcg 2/7 days).  However, TSH was slightly suppressed so I advised her to decrease the dose to 75 mcg daily.  A TSH was elevated afterwards: Lab Results  Component Value Date   TSH 7.14 (H) 12/13/2023  - she is currently on LT4 (DAW) 100 mcg daily, increased after the above results returned - pt feels good on this dose. - we discussed about taking the thyroid  hormone every day, with water, >30 minutes before breakfast, separated by >4 hours from acid reflux medications, calcium , iron, multivitamins. Pt. is taking it correctly. - will check thyroid  tests today: TSH and fT4 - If labs are abnormal, she will need to return for repeat TFTs in 1.5 months  2. Osteoporosis  -Reviewed previous bone density scan reports.  At her check from 2019, T-scores were improved.  She had another bone density scan 12/29/2022 and this showed stability of her T-scores.  The left femoral neck T-score was in the osteoporosis range.  She will be due for another bone density next year >> will need to change the DXA site, since the breast center is not offering this anymore - PCP recommended Fosamax  on 06/01/2023.  She continues on this.  I advised her to take this approximately 30 minutes before levothyroxine. 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  However, she is doing some weightbearing exercises. -I also recommended optimizing diet and vitamin D /calcium  intake.  Had a normal calcium  and GFR 02/18/2024.  3.  Vitamin D  deficiency - She was previously off her multivitamin +5000 units vitamin D  daily supplement.  She stopped these due to her reaction to dyes in her estradiol formulation.  We ended up restarting vitamin D3. - She continues on vitamin D3 2000 units daily - Latest vitamin D  level was normal in 06/03/2023 and 12/03/2023  4.  Prediabetes - Latest HbA1c was 6.1%, stable, 4  months ago - We discussed about the reversibility of prediabetes with improvement in diet and exercise. - We will recheck her HbA1c at next visit  Needs refills.  Orders Placed This Encounter  Procedures   TSH   T4, free   Lela Fendt, MD PhD [facility] [place] [facility] Endocrinology

## 2024-07-05 ENCOUNTER — Other Ambulatory Visit: Payer: Self-pay | Admitting: Internal Medicine

## 2024-07-05 ENCOUNTER — Ambulatory Visit: Admitting: Family

## 2024-07-05 ENCOUNTER — Other Ambulatory Visit: Payer: Self-pay

## 2024-07-05 ENCOUNTER — Ambulatory Visit: Payer: Self-pay | Admitting: Internal Medicine

## 2024-07-05 DIAGNOSIS — E063 Autoimmune thyroiditis: Secondary | ICD-10-CM

## 2024-07-05 MED ORDER — SYNTHROID 50 MCG PO TABS: ORAL_TABLET | ORAL | 3 refills | Status: AC

## 2024-07-05 NOTE — Telephone Encounter (Signed)
 Orders are in, and rx sent

## 2024-07-21 ENCOUNTER — Other Ambulatory Visit: Payer: Self-pay | Admitting: *Deleted

## 2024-07-21 DIAGNOSIS — F411 Generalized anxiety disorder: Secondary | ICD-10-CM

## 2024-07-21 MED ORDER — SERTRALINE HCL 100 MG PO TABS
100.0000 mg | ORAL_TABLET | Freq: Every day | ORAL | 1 refills | Status: DC
Start: 2024-07-21 — End: 2024-08-21

## 2024-08-04 ENCOUNTER — Other Ambulatory Visit: Payer: Self-pay | Admitting: Family

## 2024-08-04 DIAGNOSIS — R7303 Prediabetes: Secondary | ICD-10-CM

## 2024-08-04 DIAGNOSIS — E782 Mixed hyperlipidemia: Secondary | ICD-10-CM

## 2024-08-15 ENCOUNTER — Other Ambulatory Visit (INDEPENDENT_AMBULATORY_CARE_PROVIDER_SITE_OTHER)

## 2024-08-15 DIAGNOSIS — R7303 Prediabetes: Secondary | ICD-10-CM

## 2024-08-15 DIAGNOSIS — E782 Mixed hyperlipidemia: Secondary | ICD-10-CM

## 2024-08-15 LAB — LIPID PANEL
Cholesterol: 191 mg/dL (ref 0–200)
HDL: 59.9 mg/dL (ref >39.00)
LDL Cholesterol: 107 mg/dL — ABNORMAL HIGH (ref 0–99)
NonHDL: 131.01
Total CHOL/HDL Ratio: 3
Triglycerides: 119 mg/dL (ref 0.0–149.0)
VLDL: 23.8 mg/dL (ref 0.0–40.0)

## 2024-08-15 LAB — HEMOGLOBIN A1C: Hgb A1c MFr Bld: 6.3 % (ref 4.6–6.5)

## 2024-08-15 LAB — COMPREHENSIVE METABOLIC PANEL WITH GFR
ALT: 14 U/L (ref 0–35)
AST: 21 U/L (ref 0–37)
Albumin: 4.2 g/dL (ref 3.5–5.2)
Alkaline Phosphatase: 74 U/L (ref 39–117)
BUN: 12 mg/dL (ref 6–23)
CO2: 29 meq/L (ref 19–32)
Calcium: 9 mg/dL (ref 8.4–10.5)
Chloride: 103 meq/L (ref 96–112)
Creatinine, Ser: 0.83 mg/dL (ref 0.40–1.20)
GFR: 75.36 mL/min (ref >60.00)
Glucose, Bld: 103 mg/dL — ABNORMAL HIGH (ref 70–99)
Potassium: 4.4 meq/L (ref 3.5–5.1)
Sodium: 140 meq/L (ref 135–145)
Total Bilirubin: 0.4 mg/dL (ref 0.2–1.2)
Total Protein: 7.3 g/dL (ref 6.0–8.3)

## 2024-08-16 ENCOUNTER — Ambulatory Visit: Payer: Self-pay | Admitting: Family

## 2024-08-21 ENCOUNTER — Encounter: Payer: Self-pay | Admitting: Family

## 2024-08-21 ENCOUNTER — Ambulatory Visit: Admitting: Family

## 2024-08-21 VITALS — BP 128/70 | HR 74 | Temp 98.0°F | Ht 62.25 in | Wt 136.4 lb

## 2024-08-21 DIAGNOSIS — Z0000 Encounter for general adult medical examination without abnormal findings: Secondary | ICD-10-CM | POA: Diagnosis not present

## 2024-08-21 DIAGNOSIS — M816 Localized osteoporosis [Lequesne]: Secondary | ICD-10-CM

## 2024-08-21 DIAGNOSIS — E782 Mixed hyperlipidemia: Secondary | ICD-10-CM

## 2024-08-21 DIAGNOSIS — Z23 Encounter for immunization: Secondary | ICD-10-CM | POA: Diagnosis not present

## 2024-08-21 DIAGNOSIS — Z1231 Encounter for screening mammogram for malignant neoplasm of breast: Secondary | ICD-10-CM

## 2024-08-21 DIAGNOSIS — F411 Generalized anxiety disorder: Secondary | ICD-10-CM

## 2024-08-21 MED ORDER — SERTRALINE HCL 100 MG PO TABS: ORAL_TABLET | ORAL | 0 refills | Status: DC

## 2024-08-21 MED ORDER — LOVASTATIN 40 MG PO TABS: 40.0000 mg | ORAL_TABLET | Freq: Every day | ORAL | Status: AC

## 2024-08-21 NOTE — Progress Notes (Signed)
 Subjective:  Patient ID: Nicole Henry, female    DOB: 01/24/1961  Age: 63 y.o. MRN: [id number]  Patient Care Team: Nicole Antu, FNP as PCP - General (Family Medicine) Nicole Service, MD as Consulting Physician (Obstetrics and Gynecology) Nicole File, MD as Consulting Physician (Internal Medicine)   CC:  Chief Complaint  Patient presents with   Annual Exam    HPI Nicole Henry is a 63 y.o. female who presents today for an annual physical exam. She reports consuming a general diet. The patient has a physically strenuous job, but has no regular exercise apart from work.  She generally feels well. She reports sleeping poorly. She does not have additional problems to discuss today.   Vision:Within last year Dental:Receives regular dental care  Mammogram: 08/04/23 Last pap: 10/03/20 negative Nicole Henry Henry Colonoscopy: 03/09/24 Bone density scan: 12/29/22   Pt is without acute concerns.   Wt Readings from Last 3 Encounters:  08/21/24 136 lb 6.4 oz (61.9 kg)  07/04/24 137 lb 9.6 oz (62.4 kg)  03/20/24 139 lb 8 oz (63.3 kg)     Advanced Directives Patient does not have advanced directives     DEPRESSION SCREENING    08/21/2024    8:23 AM 03/20/2024   10:37 AM 12/13/2023    8:47 AM 09/03/2023    8:09 AM 04/13/2023    8:46 AM 02/17/2022    1:37 PM 11/24/2021    4:15 PM  PHQ 2/9 Scores  PHQ - 2 Score 0 0 0 0 0 0 0  PHQ- 9 Score 0 0 0 0   0     ROS: Negative unless specifically indicated above in HPI.    Current Outpatient Medications:    alendronate  (FOSAMAX ) 70 MG tablet, Take 1 tablet (70 mg total) by mouth every 7 (seven) days. Take with a full glass of water on an empty stomach. Remain upright for thirty minutes after taking., Disp: 12 tablet, Rfl: 3   cholecalciferol (VITAMIN D3) 25 MCG (1000 UNIT) tablet, Take 1,000 Units by mouth daily., Disp: , Rfl:    SYNTHROID  50 MCG tablet, Take 2 (100 mcg) tabs every other day alternating with 1.5 (75 mcg)  tabs on all other days ., Disp: 160 tablet, Rfl: 3   tretinoin (RETIN-A) 0.05 % cream, Apply topically at bedtime., Disp: , Rfl:    lovastatin  (MEVACOR ) 40 MG tablet, Take 1 tablet (40 mg total) by mouth at bedtime., Disp: , Rfl:    sertraline  (ZOLOFT ) 100 MG tablet, Take two tablets once nightly for total of 200 mg, Disp: 180 tablet, Rfl: 0    Objective:    BP 128/70 (BP Location: Left Arm, Patient Position: Sitting, Cuff Size: Normal)   Pulse 74   Temp 98 F (36.7 C) (Temporal)   Ht 5' 2.25 (1.581 m)   Wt 136 lb 6.4 oz (61.9 kg)   SpO2 98%   BMI 24.75 kg/m   BP Readings from Last 3 Encounters:  08/21/24 128/70  07/04/24 130/60  03/20/24 132/68  Physical Exam Constitutional:      General: She is not in acute distress.    Appearance: Normal appearance. She is normal weight. She is not ill-appearing.  HENT:     Head: Normocephalic.     Right Ear: Tympanic membrane normal.     Left Ear: Tympanic membrane normal.     Nose: Nose normal.     Mouth/Throat:     Mouth: Mucous membranes are moist.  Eyes:     Extraocular Movements: Extraocular movements intact.     Pupils: Pupils are equal, round, and reactive to light.  Cardiovascular:     Rate and Rhythm: Normal rate and regular rhythm.  Pulmonary:     Effort: Pulmonary effort is normal.     Breath sounds: Normal breath sounds.  Abdominal:     General: Abdomen is flat. Bowel sounds are normal.     Palpations: Abdomen is soft.     Tenderness: There is no guarding or rebound.  Musculoskeletal:        General: Normal range of motion.     Cervical back: Normal range of motion.  Skin:    General: Skin is warm.     Capillary Refill: Capillary refill takes less than 2 seconds.  Neurological:     General: No focal deficit present.     Mental Status: She is alert.  Psychiatric:        Mood and Affect: Mood normal.         Behavior: Behavior normal.        Thought Content: Thought content normal.        Judgment: Judgment normal.       Results LABS Hemoglobin A1c: 6.3% Total Cholesterol: 191 mg/dL      Assessment & Plan:   Assessment and Plan Assessment & Plan Prediabetes Hemoglobin A1c has increased from 5.9 a year ago to 6.3, indicating progression towards diabetes. Discussed dietary influences such as white breads, pastas, and sweets, and the potential hereditary factors. - Provide a prediabetic eating plan - Encourage Mediterranean diet - Encourage regular exercise, aiming for 30 minutes of targeted activity 3-5 times a week - Consider referral to a dietitian if dietary changes become challenging  Mixed hyperlipidemia Cholesterol level is 191. She is on lovastatin , which was started in December. Discussed the importance of monitoring LDL levels. - Continue lovastatin  - Monitor cholesterol levels regularly  Osteoporosis She has been off Fosamax  for about a year. Last bone density was well-managed, likely due to previous Fosamax  use. Discussed potential alternative treatments if osteoporosis progresses. - Schedule bone density scan after December 29, 2024 - Consider Prolia if osteoporosis progresses and Fosamax  is not tolerated  Generalized anxiety disorder She is currently on sertraline  and reports it is working well at the current dose.  Menopausal symptoms (hot flashes, insomnia) Experiencing hot flashes and insomnia, likely related to menopause. Discussed increasing sertraline  dose to help manage symptoms. - Increase sertraline  to 200 mg daily - Monitor response to increased sertraline  dose - Consider alternative treatments if symptoms persist  Thyroid  dysfunction Thyroid  levels are unstable, contributing to symptoms such as sweating and anxiety. Discussed the impact of thyroid  on cholesterol and metabolic functions.  Arthritis, unspecified site Arthritis symptoms are  exacerbated by sugar intake. Discussed dietary modifications to manage symptoms. - Encourage reduction of sugar intake to manage arthritis symptoms  General Health Maintenance Discussed the importance of vaccinations and screenings. She received flu vaccine today. - Consider pneumonia vaccine - Discuss COVID-19 vaccination with [facility] - Schedule mammogram and bone density scan together Patient Counseling(The following topics were reviewed):  Preventative care handout given to pt  Health maintenance and immunizations reviewed. Please refer to Health maintenance section. Pt advised on safe sex, wearing seatbelts in car, and proper nutrition labwork ordered today for annual Dental health: Discussed importance of regular tooth brushing, flossing, and dental visits.   Follow-Up Discussed follow-up plans for various health concerns. - Follow up on sertraline  dose adjustment - Schedule bone density scan after December 29, 2024 - Coordinate mammogram and bone density scan appointments  Recording duration: 32 minutes       Follow-up:  one year for annual CPE   Ginger Patrick, FNP

## 2024-08-21 NOTE — Patient Instructions (Addendum)
  I have sent an electronic order over to your preferred location for the following:   []   2D Mammogram  [x]   3D Mammogram  [x]   Bone Density   Please give this center a call to get scheduled at your convenience.   [x]   [facility]      [address] [place], [place]        [phone]         Make sure to wear two piece  clothing  No lotions powders or deodorants the day of the appointment Make sure to bring picture ID and insurance card.  Bring list of medications you are currently taking including any supplements.    ------------------------------------

## 2024-09-27 DIAGNOSIS — L820 Inflamed seborrheic keratosis: Secondary | ICD-10-CM | POA: Diagnosis not present

## 2024-09-27 DIAGNOSIS — Z85828 Personal history of other malignant neoplasm of skin: Secondary | ICD-10-CM | POA: Diagnosis not present

## 2024-09-27 DIAGNOSIS — D2262 Melanocytic nevi of left upper limb, including shoulder: Secondary | ICD-10-CM | POA: Diagnosis not present

## 2024-10-25 ENCOUNTER — Ambulatory Visit: Payer: Self-pay

## 2024-10-25 NOTE — Telephone Encounter (Signed)
 LM for pt to return call.

## 2024-10-25 NOTE — Telephone Encounter (Signed)
 NOTED

## 2024-10-25 NOTE — Telephone Encounter (Signed)
 Called patient states she has talked with triage nurse. Apologized for call didn't not see note in chart. States that she has had improvement with pain. After vomiting so much last night. She is not having any symptoms at this time. She has noticed increased belching recently. She denies any nausea, vomiting, changes in bowel movents. I have reviewed all red words with her and she agreed if any she will go to ED. Advised to make sure she is getting water as tolerated. She will reach out if any questions before visit.

## 2024-10-25 NOTE — Telephone Encounter (Signed)
 FYI Only or Action Required?: FYI only for provider: appointment scheduled on 10/26/2024.  Patient was last seen in primary care on 08/21/2024 by Corwin Antu, FNP.  Called Nurse Triage reporting Abdominal Pain.  Symptoms began yesterday.  Interventions attempted: Nothing.  Symptoms are: gradually improving.  Triage Disposition: See Physician Within 24 Hours  Patient/caregiver understands and will follow disposition?: Yes          Copied from CRM #[id number]. Topic: Clinical - Red Word Triage >> Oct 25, 2024  8:31 AM Viola F wrote: Patient having pain in stomach and vomiting Reason for Disposition  [1] MODERATE pain (e.g., interferes with normal activities) AND [2] pain comes and goes (cramps) AND [3] present > 24 hours  (Exception: Pain with Vomiting or Diarrhea - see that Guideline.)  Answer Assessment - Initial Assessment Questions Vomited 10 times yesterday and states pain and nausea now is improved.   1. LOCATION: Where does it hurt?      Below breast area across her abdomen  2. ONSET: When did the pain begin? (e.g., minutes, hours or days ago)      Last night  3. SUDDEN: Gradual or sudden onset?     Sudden  4. PATTERN Does the pain come and go, or is it constant?     Comes and goes  5. SEVERITY: How bad is the pain?  (e.g., Scale 1-10; mild, moderate, or severe)     Pt states pain is now improved 6. OTHER SYMPTOMS: Do you have any other symptoms? (e.g., back pain, diarrhea, fever, urination pain, vomiting)       Vomiting  Protocols used: Abdominal Pain - Female-A-AH

## 2024-10-26 ENCOUNTER — Ambulatory Visit: Admitting: Family Medicine

## 2024-10-26 ENCOUNTER — Encounter: Payer: Self-pay | Admitting: Family Medicine

## 2024-10-26 VITALS — BP 146/86 | HR 88 | Temp 97.7°F | Ht 62.25 in | Wt 135.8 lb

## 2024-10-26 DIAGNOSIS — R1011 Right upper quadrant pain: Secondary | ICD-10-CM | POA: Diagnosis not present

## 2024-10-26 NOTE — Assessment & Plan Note (Signed)
 Acute, recurrent.  Following greasy meal. Symptoms most consistent with gallbladder disease but on differential is also gastritis, peptic ulcer disease, pancreatitis and liver issue.  No clear suggestion of infectious etiology at this point. Will evaluate with labs including c-Met, lipase, CBC. Will likely need to move ahead with ultrasound right upper quadrant to evaluate liver and gallbladder.  Return and ER precautions provided.

## 2024-10-26 NOTE — Progress Notes (Signed)
 Patient ID: Nicole Henry, female    DOB: [birth year], 63 y.o.   MRN: [id number]  This visit was conducted in person.  BP (!) 146/86   Pulse 88   Temp 97.7 F (36.5 C) (Oral)   Ht 5' 2.25 (1.581 m)   Wt 135 lb 12.8 oz (61.6 kg)   SpO2 98%   BMI 24.64 kg/m    CC:  Chief Complaint  Patient presents with   Abdominal Pain    States Tuesday night she was having pain across her stomach under breast. Started throwing up. Hasn't been bothering her since she has thrown up. Second time this has happened in the last two months. States she has had gas with it. Has never seen a GI.     Subjective:   HPI: Nicole Henry is a 63 y.o. female presenting on 10/26/2024 for Abdominal Pain (States Tuesday night she was having pain across her stomach under breast. Started throwing up. Hasn't been bothering her since she has thrown up. Second time this has happened in the last two months. States she has had gas with it. Has never seen a GI. )  New onset pain in upper abdomen 2 days ago, nausea  Occurred after eating fried chicken for lunch Tried Tums, Gas-x and tylenol no better  Followed by emesis   later that day., watery no blood  Occurred 10 times.. no further pain since.  No fever, no D/C. No reflux/ heartburn.  She has had similar issue  2 months ago.. upper abdomen.. no emesis.    Has gallbladder.   She has had couplet episodes of explosive diarrhea in the last 6 months.     Relevant past medical, surgical, family and social history reviewed and updated as indicated. Interim medical history since our last visit reviewed. Allergies and medications reviewed and updated. Outpatient Medications Prior to Visit  Medication Sig Dispense Refill   alendronate  (FOSAMAX ) 70 MG tablet Take 1 tablet (70 mg total) by mouth every 7 (seven) days. Take with a full glass of water on an empty stomach. Remain upright for thirty minutes after taking. 12 tablet 3   cholecalciferol (VITAMIN D3) 25 MCG  (1000 UNIT) tablet Take 1,000 Units by mouth daily.     lovastatin  (MEVACOR ) 40 MG tablet Take 1 tablet (40 mg total) by mouth at bedtime.     sertraline  (ZOLOFT ) 100 MG tablet Take two tablets once nightly for total of 200 mg 180 tablet 0   SYNTHROID  50 MCG tablet Take 2 (100 mcg) tabs every other day alternating with 1.5 (75 mcg) tabs on all other days . 160 tablet 3   tretinoin (RETIN-A) 0.05 % cream Apply topically at bedtime.     No facility-administered medications prior to visit.     Per HPI unless specifically indicated in ROS section below Review of Systems  Constitutional:  Negative for fatigue and fever.  HENT:  Negative for congestion.   Eyes:  Negative for pain.  Respiratory:  Negative for cough and shortness of breath.   Cardiovascular:  Negative for chest pain, palpitations and leg swelling.  Gastrointestinal:  Positive for abdominal pain, nausea and vomiting. Negative for constipation.  Genitourinary:  Negative for dysuria and vaginal bleeding.  Musculoskeletal:  Negative for back pain.  Neurological:  Negative for syncope, light-headedness and headaches.  Psychiatric/Behavioral:  Negative for dysphoric mood.    Objective:  BP (!) 146/86   Pulse 88   Temp 97.7 F (36.5 C) (Oral)  Ht 5' 2.25 (1.581 m)   Wt 135 lb 12.8 oz (61.6 kg)   SpO2 98%   BMI 24.64 kg/m   Wt Readings from Last 3 Encounters:  10/26/24 135 lb 12.8 oz (61.6 kg)  08/21/24 136 lb 6.4 oz (61.9 kg)  07/04/24 137 lb 9.6 oz (62.4 kg)      Physical Exam Constitutional:      General: She is not in acute distress.    Appearance: Normal appearance. She is well-developed. She is not ill-appearing or toxic-appearing.  HENT:     Head: Normocephalic.     Right Ear: Hearing, tympanic membrane, ear canal and external ear normal. Tympanic membrane is not erythematous, retracted or bulging.     Left Ear: Hearing, tympanic membrane, ear canal and external ear normal. Tympanic membrane is not erythematous,  retracted or bulging.     Nose: No mucosal edema or rhinorrhea.     Right Sinus: No maxillary sinus tenderness or frontal sinus tenderness.     Left Sinus: No maxillary sinus tenderness or frontal sinus tenderness.     Mouth/Throat:     Pharynx: Uvula midline.  Eyes:     General: Lids are normal. Lids are everted, no foreign bodies appreciated.     Conjunctiva/sclera: Conjunctivae normal.     Pupils: Pupils are equal, round, and reactive to light.  Neck:     Thyroid : No thyroid  mass or thyromegaly.     Vascular: No carotid bruit.     Trachea: Trachea normal.  Cardiovascular:     Rate and Rhythm: Normal rate and regular rhythm.     Pulses: Normal pulses.     Heart sounds: Normal heart sounds, S1 normal and S2 normal. No murmur heard.    No friction rub. No gallop.  Pulmonary:     Effort: Pulmonary effort is normal. No tachypnea or respiratory distress.     Breath sounds: Normal breath sounds. No decreased breath sounds, wheezing, rhonchi or rales.  Abdominal:     General: Bowel sounds are normal.     Palpations: Abdomen is soft.     Tenderness: There is abdominal tenderness in the right upper quadrant. There is no right CVA tenderness, left CVA tenderness, guarding or rebound. Positive signs include Murphy's sign.  Musculoskeletal:     Cervical back: Normal range of motion and neck supple.  Skin:    General: Skin is warm and dry.     Findings: No rash.  Neurological:     Mental Status: She is alert.  Psychiatric:        Mood and Affect: Mood is not anxious or depressed.        Speech: Speech normal.        Behavior: Behavior normal. Behavior is cooperative.        Thought Content: Thought content normal.        Judgment: Judgment normal.       Results for orders placed or performed in visit on 08/15/24  Lipid panel   Collection Time: 08/15/24  8:10 AM  Result Value Ref Range   Cholesterol 191 0 - 200 mg/dL   Triglycerides 880.9 0.0 - 149.0 mg/dL   HDL 40.09 >60.99 mg/dL    VLDL 76.1 0.0 - 59.9 mg/dL   LDL Cholesterol 892 (H) 0 - 99 mg/dL   Total CHOL/HDL Ratio 3    NonHDL 131.01   Hemoglobin A1c   Collection Time: 08/15/24  8:10 AM  Result Value Ref Range   Hgb A1c  MFr Bld 6.3 4.6 - 6.5 %  Comprehensive metabolic panel with GFR   Collection Time: 08/15/24  8:10 AM  Result Value Ref Range   Sodium 140 135 - 145 mEq/L   Potassium 4.4 3.5 - 5.1 mEq/L   Chloride 103 96 - 112 mEq/L   CO2 29 19 - 32 mEq/L   Glucose, Bld 103 (H) 70 - 99 mg/dL   BUN 12 6 - 23 mg/dL   Creatinine, Ser 9.16 0.40 - 1.20 mg/dL   Total Bilirubin 0.4 0.2 - 1.2 mg/dL   Alkaline Phosphatase 74 39 - 117 U/L   AST 21 0 - 37 U/L   ALT 14 0 - 35 U/L   Total Protein 7.3 6.0 - 8.3 g/dL   Albumin 4.2 3.5 - 5.2 g/dL   GFR 24.63 >39.99 mL/min   Calcium  9.0 8.4 - 10.5 mg/dL    Assessment and Plan  RUQ pain Assessment & Plan: Acute, recurrent.  Following greasy meal. Symptoms most consistent with gallbladder disease but on differential is also gastritis, peptic ulcer disease, pancreatitis and liver issue.  No clear suggestion of infectious etiology at this point. Will evaluate with labs including c-Met, lipase, CBC. Will likely need to move ahead with ultrasound right upper quadrant to evaluate liver and gallbladder.  Return and ER precautions provided.  Orders: -     CBC with Differential/Platelet -     Comprehensive metabolic panel with GFR -     Lipase -     Gamma GT -     US  ABDOMEN LIMITED RUQ (LIVER/GB); Future    No follow-ups on file.   Greig Ring, MD

## 2024-10-27 ENCOUNTER — Ambulatory Visit: Payer: Self-pay | Admitting: Family Medicine

## 2024-10-27 LAB — COMPREHENSIVE METABOLIC PANEL WITH GFR
ALT: 17 U/L (ref 0–35)
AST: 20 U/L (ref 0–37)
Albumin: 4.3 g/dL (ref 3.5–5.2)
Alkaline Phosphatase: 71 U/L (ref 39–117)
BUN: 14 mg/dL (ref 6–23)
CO2: 31 meq/L (ref 19–32)
Calcium: 9.1 mg/dL (ref 8.4–10.5)
Chloride: 102 meq/L (ref 96–112)
Creatinine, Ser: 0.85 mg/dL (ref 0.40–1.20)
GFR: 73.14 mL/min (ref >60.00)
Glucose, Bld: 94 mg/dL (ref 70–99)
Potassium: 4.3 meq/L (ref 3.5–5.1)
Sodium: 139 meq/L (ref 135–145)
Total Bilirubin: 0.4 mg/dL (ref 0.2–1.2)
Total Protein: 7.4 g/dL (ref 6.0–8.3)

## 2024-10-27 LAB — CBC WITH DIFFERENTIAL/PLATELET
Basophils Absolute: 0.1 K/uL (ref 0.0–0.1)
Basophils Relative: 1.5 % (ref 0.0–3.0)
Eosinophils Absolute: 0.2 K/uL (ref 0.0–0.7)
Eosinophils Relative: 2.8 % (ref 0.0–5.0)
HCT: 38.6 % (ref 36.0–46.0)
Hemoglobin: 12.7 g/dL (ref 12.0–15.0)
Lymphocytes Relative: 40.8 % (ref 12.0–46.0)
Lymphs Abs: 2.3 K/uL (ref 0.7–4.0)
MCHC: 32.9 g/dL (ref 30.0–36.0)
MCV: 86.2 fl (ref 78.0–100.0)
Monocytes Absolute: 0.5 K/uL (ref 0.1–1.0)
Monocytes Relative: 9.2 % (ref 3.0–12.0)
Neutro Abs: 2.5 K/uL (ref 1.4–7.7)
Neutrophils Relative %: 45.7 % (ref 43.0–77.0)
Platelets: 315 K/uL (ref 150.0–400.0)
RBC: 4.48 Mil/uL (ref 3.87–5.11)
RDW: 14 % (ref 11.5–15.5)
WBC: 5.5 K/uL (ref 4.0–10.5)

## 2024-10-27 LAB — GAMMA GT: GGT: 11 U/L (ref 7–51)

## 2024-10-27 LAB — LIPASE: Lipase: 39 U/L (ref 11.0–59.0)

## 2024-10-30 ENCOUNTER — Ambulatory Visit
Admission: RE | Admit: 2024-10-30 | Discharge: 2024-10-30 | Disposition: A | Discharge status: Home / Self Care | Source: Ambulatory Visit | Attending: Family Medicine | Admitting: Family Medicine

## 2024-10-30 DIAGNOSIS — R1011 Right upper quadrant pain: Secondary | ICD-10-CM | POA: Insufficient documentation

## 2024-10-30 DIAGNOSIS — K760 Fatty (change of) liver, not elsewhere classified: Secondary | ICD-10-CM | POA: Diagnosis not present

## 2024-10-30 DIAGNOSIS — K8020 Calculus of gallbladder without cholecystitis without obstruction: Secondary | ICD-10-CM | POA: Diagnosis not present

## 2024-11-30 ENCOUNTER — Other Ambulatory Visit: Payer: Self-pay | Admitting: *Deleted

## 2024-11-30 DIAGNOSIS — F411 Generalized anxiety disorder: Secondary | ICD-10-CM

## 2024-11-30 MED ORDER — SERTRALINE HCL 100 MG PO TABS: ORAL_TABLET | ORAL | 1 refills | Status: AC

## 2024-12-08 ENCOUNTER — Encounter: Payer: Self-pay | Admitting: Family

## 2024-12-15 ENCOUNTER — Encounter: Payer: Self-pay | Admitting: Family

## 2024-12-15 ENCOUNTER — Ambulatory Visit: Admitting: Family

## 2024-12-15 VITALS — BP 120/82 | HR 84 | Temp 98.1°F | Wt 138.6 lb

## 2024-12-15 DIAGNOSIS — R350 Frequency of micturition: Secondary | ICD-10-CM | POA: Diagnosis not present

## 2024-12-15 DIAGNOSIS — R82998 Other abnormal findings in urine: Secondary | ICD-10-CM | POA: Diagnosis not present

## 2024-12-15 DIAGNOSIS — K8080 Other cholelithiasis without obstruction: Secondary | ICD-10-CM

## 2024-12-15 DIAGNOSIS — N3000 Acute cystitis without hematuria: Secondary | ICD-10-CM | POA: Diagnosis not present

## 2024-12-15 DIAGNOSIS — K8020 Calculus of gallbladder without cholecystitis without obstruction: Secondary | ICD-10-CM | POA: Insufficient documentation

## 2024-12-15 LAB — URINALYSIS, ROUTINE W REFLEX MICROSCOPIC
Bilirubin Urine: NEGATIVE
Hgb urine dipstick: NEGATIVE
Ketones, ur: NEGATIVE
Nitrite: NEGATIVE
Specific Gravity, Urine: <=1.005 — AB (ref 1.000–1.030)
Total Protein, Urine: NEGATIVE
Urine Glucose: NEGATIVE
Urobilinogen, UA: 0.2 (ref 0.0–1.0)
pH: 6 (ref 5.0–8.0)

## 2024-12-15 LAB — POCT URINE DIPSTICK
Bilirubin, UA: NEGATIVE
Blood, UA: NEGATIVE
Glucose, UA: NEGATIVE mg/dL
Ketones, POC UA: NEGATIVE mg/dL
Nitrite, UA: NEGATIVE
POC PROTEIN,UA: NEGATIVE
Spec Grav, UA: 1.015
Urobilinogen, UA: 0.2 U/dL
pH, UA: 6

## 2024-12-15 MED ORDER — CEPHALEXIN 500 MG PO CAPS: 500.0000 mg | ORAL_CAPSULE | Freq: Three times a day (TID) | ORAL | 0 refills | Status: AC

## 2024-12-15 NOTE — Progress Notes (Signed)
 "                                                                                                                                                                                                                                                                                                                                                                     Established Patient Office Visit  Subjective:      CC:  Chief Complaint  Patient presents with   Urinary Tract Infection    HPI: ELLIOTTE Henry is a 64 y.o. female presenting on 12/15/2024 for Urinary Tract Infection .  Discussed the use of AI scribe software for clinical note transcription with the patient, who gave verbal consent to proceed.  History of Present Illness Nicole Henry is a 64 year old female who presents with urinary symptoms.  She has experienced increased urinary frequency and decreased volume of urination for the past two weeks. No burning sensation during urination, fever, chills, vaginal discharge, or itching. She also denies back pain or lower abdominal pain.  She has a history of allergies to Cipro, sulfa, nitrofurantoin, and penicillins, but she tolerated Cipro once in 2019. She has previously taken cephalexin  without issues.  She has a history of gallstones, with the largest being 11.3 millimeters. She experienced severe abdominal pain and vomiting in the past due to gallstones but has since made dietary changes.         Social history:  Relevant past medical, surgical, family and social history reviewed and updated as indicated. Interim medical history since our last visit reviewed.  Allergies and medications reviewed and updated.  DATA REVIEWED: CHART IN EPIC     ROS: Negative unless specifically indicated above in HPI.   Current Medications[1]  Objective:        BP 120/82 (BP Location: Left Arm, Patient Position: Sitting, Cuff Size: Normal)   Pulse 84   Temp 98.1 F  (36.7 C) (Temporal)   Wt 138 lb 9.6 oz (62.9 kg)   SpO2 99%   BMI 25.15 kg/m   Physical Exam   Wt Readings from Last 3 Encounters:  12/15/24 138 lb 9.6 oz (62.9 kg)  10/26/24 135 lb 12.8 oz (61.6 kg)  08/21/24 136 lb 6.4 oz (61.9 kg)    Physical Exam Constitutional:      General: She is not in acute distress.    Appearance: Normal appearance. She is normal weight. She is not ill-appearing, toxic-appearing or diaphoretic.  Cardiovascular:     Rate and Rhythm: Normal rate.  Pulmonary:     Effort: Pulmonary effort is normal.  Abdominal:     General: Abdomen is flat.     Tenderness: There is abdominal tenderness in the suprapubic area. There is no right CVA tenderness or left CVA tenderness.  Neurological:     General: No focal deficit present.     Mental Status: She is alert and oriented to person, place, and time. Mental status is at baseline.     Motor: No weakness.  Psychiatric:        Mood and Affect: Mood normal.        Behavior: Behavior normal.        Thought Content: Thought content normal.        Judgment: Judgment normal.          Results Labs Urinalysis (12/15/2024): Leukocyte esterase positive, increased white blood cells, nitrites negative, hematuria negative  Radiology Abdominal ultrasound (10/26/2024): Cholelithiasis, largest gallstone 11.3 mm '; Assessment & Plan:   Assessment and Plan Assessment & Plan Acute cystitis Urinary symptoms for two weeks, including increased frequency and decreased volume. No dysuria, fever, chills, back pain, or lower abdominal pain. Urinalysis shows leukocytes, indicating possible bacterial infection. No nitrates or hematuria. Differential includes UTI, pending culture results. Allergies to Cipro, sulfa, and penicillins, but tolerates cephalexin . - Prescribed cephalexin  for suspected UTI., cephalexin  500 mg tid x 5 days  - Await culture results to confirm UTI and adjust treatment if  necessary.  Cholelithiasis Gallstones with the largest measuring 11.3 mm. No current symptoms of biliary colic or cholecystitis. Previous severe pain and vomiting, but no recent episodes. Dietary changes implemented to manage symptoms. - Continue dietary modifications to manage gallstone symptoms. - Advised to seek medical attention if severe abdominal pain or vomiting recurs.        Return if symptoms worsen or fail to improve.     Ginger Patrick, MSN, APRN, FNP-C [facility]                                                      [1]  Current Outpatient Medications:    alendronate  (FOSAMAX ) 70 MG tablet, Take 1 tablet (70 mg total) by mouth every 7 (seven) days. Take with a full glass of water on an empty stomach. Remain upright for thirty minutes after taking., Disp: 12 tablet, Rfl: 3   cephALEXin  (KEFLEX ) 500 MG capsule, Take 1 capsule (500 mg total) by mouth 3 (three) times daily for 5 days., Disp: 15 capsule, Rfl: 0   cholecalciferol (VITAMIN D3) 25  MCG (1000 UNIT) tablet, Take 1,000 Units by mouth daily., Disp: , Rfl:    lovastatin  (MEVACOR ) 40 MG tablet, Take 1 tablet (40 mg total) by mouth at bedtime., Disp: , Rfl:    sertraline  (ZOLOFT ) 100 MG tablet, Take two tablets once nightly for total of 200 mg, Disp: 180 tablet, Rfl: 1   SYNTHROID  50 MCG tablet, Take 2 (100 mcg) tabs every other day alternating with 1.5 (75 mcg) tabs on all other days ., Disp: 160 tablet, Rfl: 3   tretinoin (RETIN-A) 0.05 % cream, Apply topically at bedtime., Disp: , Rfl:   "

## 2024-12-17 ENCOUNTER — Ambulatory Visit: Payer: Self-pay | Admitting: Family

## 2024-12-17 DIAGNOSIS — N3000 Acute cystitis without hematuria: Secondary | ICD-10-CM

## 2024-12-18 LAB — URINE CULTURE
MICRO NUMBER:: 17419596
SPECIMEN QUALITY:: ADEQUATE

## 2024-12-19 ENCOUNTER — Ambulatory Visit: Admitting: Family

## 2024-12-27 ENCOUNTER — Other Ambulatory Visit

## 2024-12-27 ENCOUNTER — Other Ambulatory Visit: Payer: Self-pay

## 2024-12-27 DIAGNOSIS — N3000 Acute cystitis without hematuria: Secondary | ICD-10-CM

## 2024-12-29 LAB — URINE CULTURE
MICRO NUMBER:: 17468222
SPECIMEN QUALITY:: ADEQUATE

## 2025-01-03 ENCOUNTER — Ambulatory Visit: Payer: Self-pay | Admitting: Family

## 2025-01-03 DIAGNOSIS — N3000 Acute cystitis without hematuria: Secondary | ICD-10-CM

## 2025-01-05 ENCOUNTER — Ambulatory Visit: Payer: Self-pay

## 2025-01-05 MED ORDER — CIPROFLOXACIN HCL 500 MG PO TABS: 500.0000 mg | ORAL_TABLET | Freq: Two times a day (BID) | ORAL | 0 refills | Status: AC

## 2025-01-05 NOTE — Telephone Encounter (Signed)
 Spoke with pt and she said that she wants to try Cipro . It has been awhile since she took it but it willing to try. She did not feel like her symptoms improved of cephalexin . Pt would like to have her prescription be for at least 10 days if possible.

## 2025-01-05 NOTE — Telephone Encounter (Signed)
 Can we clarify with her  Ciprofloxacin  was listed on her intolerance list but under the note of intolerance it says 'pt states she tolerates this, was last given in 2019'.   We are very limited because of all of the adverse reactions she has experienced with antibiotics so cipro  is the best option if it is tolerated   When she took cephalexin  (the last antibotic) did symptoms improve? The new indication is for five days total

## 2025-01-05 NOTE — Addendum Note (Signed)
 Addended by: CORWIN ANTU on: 01/05/2025 11:09 AM   Modules accepted: Orders

## 2025-01-05 NOTE — Telephone Encounter (Signed)
 Please see MyChart message. Ginger has addressed this.

## 2025-01-05 NOTE — Telephone Encounter (Signed)
 FYI Only or Action Required?: Action required by provider: clinical question for provider, update on patient condition, and new antibiotic, verified intolerant of cipro .  Patient was last seen in primary care on 12/15/2024 by Corwin Antu, FNP.  Called Nurse Triage reporting New Med Request.  Symptoms began several weeks ago.  Interventions attempted: Prescription medications: completed cephalexin .  Symptoms are: gradually worsening.  Triage Disposition: See PCP When Office is Open (Within 3 Days)  Patient/caregiver understands and will follow disposition?: No, wishes to speak with PCP   Reason for Disposition  Prescription request for new medicine (not a refill)  Answer Assessment - Initial Assessment Questions Patient calling due to not receiving response message in MyChart (01/04/25) from clinic, she is worried her medication will not be sent before the weekend.  Patient reports her uti symptoms nearly resolved on cephalexin  she took for 5 days, symptoms returned 3 days later. She is concerned that she did not take cephalexin  long enough, normally uses for 7-10 days.  She would like to let pcp know that she intolerant of ciprofloxacin  and would like another class of antibiotic or longer course of cephalexin .   Please advise, patient requesting call back from clinic today [phone]  Protocols used: Medication Refill and Renewal Call-A-AH Improved on cephalexin . 3 days later symptoms returned.    Message from Bellaire S sent at 01/05/2025  8:12 AM EST  Summary: UTI   Reason for Triage: re-occurring UTI after completion of antibiotic; requesting another antibiotic

## 2025-01-10 ENCOUNTER — Telehealth: Payer: Self-pay

## 2025-01-10 NOTE — Telephone Encounter (Signed)
 Per pt cancel not having any problems as of now. Will call back if she wants to r/s.

## 2025-01-11 ENCOUNTER — Ambulatory Visit: Admitting: Gastroenterology

## 2025-07-04 ENCOUNTER — Ambulatory Visit: Admitting: Internal Medicine
